# Patient Record
Sex: Male | Born: 1940 | Race: White | Hispanic: No | Marital: Married | Smoking: Former smoker
Health system: Southern US, Community
[De-identification: ages and names within clinical notes are randomized; demographics above are authoritative.]

## PROBLEM LIST (undated history)

## (undated) DIAGNOSIS — E7800 Pure hypercholesterolemia, unspecified: Secondary | ICD-10-CM

## (undated) DIAGNOSIS — K219 Gastro-esophageal reflux disease without esophagitis: Secondary | ICD-10-CM

## (undated) DIAGNOSIS — M159 Polyosteoarthritis, unspecified: Secondary | ICD-10-CM

## (undated) DIAGNOSIS — L309 Dermatitis, unspecified: Secondary | ICD-10-CM

## (undated) DIAGNOSIS — N401 Enlarged prostate with lower urinary tract symptoms: Secondary | ICD-10-CM

## (undated) DIAGNOSIS — M889 Osteitis deformans of unspecified bone: Secondary | ICD-10-CM

## (undated) DIAGNOSIS — G20A1 Parkinson's disease without dyskinesia, without mention of fluctuations: Secondary | ICD-10-CM

## (undated) DIAGNOSIS — I2510 Atherosclerotic heart disease of native coronary artery without angina pectoris: Secondary | ICD-10-CM

## (undated) DIAGNOSIS — G4733 Obstructive sleep apnea (adult) (pediatric): Secondary | ICD-10-CM

## (undated) DIAGNOSIS — M1990 Unspecified osteoarthritis, unspecified site: Secondary | ICD-10-CM

## (undated) DIAGNOSIS — G4730 Sleep apnea, unspecified: Secondary | ICD-10-CM

## (undated) DIAGNOSIS — I4891 Unspecified atrial fibrillation: Secondary | ICD-10-CM

## (undated) DIAGNOSIS — I255 Ischemic cardiomyopathy: Secondary | ICD-10-CM

## (undated) DIAGNOSIS — G20 Parkinson's disease: Secondary | ICD-10-CM

## (undated) DIAGNOSIS — G459 Transient cerebral ischemic attack, unspecified: Secondary | ICD-10-CM

## (undated) DIAGNOSIS — K4090 Unilateral inguinal hernia, without obstruction or gangrene, not specified as recurrent: Secondary | ICD-10-CM

## (undated) DIAGNOSIS — N183 Chronic kidney disease, stage 3 unspecified: Secondary | ICD-10-CM

## (undated) DIAGNOSIS — R490 Dysphonia: Secondary | ICD-10-CM

## (undated) DIAGNOSIS — E782 Mixed hyperlipidemia: Secondary | ICD-10-CM

## (undated) DIAGNOSIS — N138 Other obstructive and reflux uropathy: Secondary | ICD-10-CM

## (undated) DIAGNOSIS — Z789 Other specified health status: Secondary | ICD-10-CM

## (undated) HISTORY — DX: Sleep apnea, unspecified: G47.30

## (undated) HISTORY — DX: Unspecified osteoarthritis, unspecified site: M19.90

## (undated) HISTORY — DX: Mixed hyperlipidemia: E78.2

## (undated) HISTORY — DX: Other obstructive and reflux uropathy: N40.1

## (undated) HISTORY — DX: Polyosteoarthritis, unspecified: M15.9

## (undated) HISTORY — DX: Pure hypercholesterolemia, unspecified: E78.00

## (undated) HISTORY — DX: Other obstructive and reflux uropathy: N13.8

## (undated) HISTORY — PX: COLONOSCOPY: SHX174

## (undated) HISTORY — DX: Osteitis deformans of unspecified bone: M88.9

## (undated) HISTORY — DX: Obstructive sleep apnea (adult) (pediatric): G47.33

## (undated) HISTORY — DX: Other specified health status: Z78.9

## (undated) HISTORY — DX: Gastro-esophageal reflux disease without esophagitis: K21.9

## (undated) HISTORY — PX: OTHER SURGICAL HISTORY: SHX169

## (undated) HISTORY — DX: Dysphonia: R49.0

## (undated) HISTORY — DX: Chronic kidney disease, stage 3 unspecified: N18.30

## (undated) HISTORY — PX: TRANSTHORACIC ECHOCARDIOGRAM: SHX275

## (undated) HISTORY — DX: Dermatitis, unspecified: L30.9

---

## 1973-12-30 HISTORY — PX: VASECTOMY: SHX75

## 1998-06-23 ENCOUNTER — Ambulatory Visit: Admission: RE | Admit: 1998-06-23 | Discharge: 1998-06-23 | Payer: Self-pay | Admitting: Internal Medicine

## 2004-12-30 HISTORY — PX: KNEE SURGERY: SHX244

## 2005-12-30 HISTORY — PX: KNEE SURGERY: SHX244

## 2013-12-30 HISTORY — PX: COLONOSCOPY: SHX174

## 2017-09-24 ENCOUNTER — Encounter: Payer: Self-pay | Admitting: Neurology

## 2017-10-15 ENCOUNTER — Encounter: Payer: Self-pay | Admitting: Neurology

## 2017-10-20 NOTE — Progress Notes (Signed)
Mario George was seen today in the movement disorders clinic for neurologic consultation at the request of Merlene Laughter, MD.  The consultation is for the evaluation of possible Parkinson's disease.  I have reviewed records made available to me.  This patient is accompanied in the office by his spouse who supplements the history.  Specific Symptoms:  Tremor: Yes.   he has noted tremor for about 6 months in the left hand.  It is worse with vigorous exercise.  Wife notes it most with walking and holding steering wheel.  It is present at rest.  Wife noted it occasionally in the right hand.  He is right hand dominant. Family hx of similar:  No. Voice: He has noticed voice changes for about 2 years per patient.  States that he sang a lot as a younger person and noted about 4 years ago he was losing his voice and they were looking for vocal paralysis.  He had been living in [place] and was told that he might need surgery for a vocal cord issue.  He has been trying to hold off on surgery until he could afford to go to [place] to get it done but then [place] states.  His PCP told him he thought that this was a bigger issue. Sleep: sleeps well  Vivid Dreams:  No.  Acting out dreams:  No., snores and occasionally talk Wet Pillows: Yes.   Postural symptoms:  No.  Falls?  No. Bradykinesia symptoms: shuffling gait, drooling while awake and difficulty getting out of a chair Loss of smell:  Yes.   Loss of taste:  Yes.   - likes very spicy food Urinary Incontinence:  No. Difficulty Swallowing:  Yes.   but just apples.  Some coughing with meals Handwriting, micrographia: No. Trouble with ADL's:  Yes.   (able to do it all but slower)  Trouble buttoning clothing: No. Depression:  No. Memory changes:  No. Hallucinations:  No.  visual distortions: Yes.   N/V:  No. Lightheaded:  No.  Syncope: No. Diplopia:  No. Dyskinesia:  No.  Neuroimaging of the brain has previously been performed.   They were done in September, 2017 in [place].  No report is available in [place].  These were printed out films from [place] and not on a CD.  These were done without gadolinium.  They were unremarkable.  PREVIOUS MEDICATIONS: none to date  ALLERGIES:   Allergies  Allergen Reactions  . Other     Had rash on arm after pneumonia and flu vaccine together - never had either separately prior to this reaction  . Statins     Weakness/myalgia     CURRENT MEDICATIONS:  Outpatient Encounter Prescriptions as of 10/21/2017  Medication Sig  . cholecalciferol (VITAMIN D) 1000 units tablet Take 1,000 Units by mouth daily.  Marland Kitchen co-enzyme Q-10 30 MG capsule Take 100 mg by mouth 3 (three) times daily.  . LevOCARNitine (CARNITINE, L,) POWD by Does not apply route.  . Magnesium 400 MG CAPS Take by mouth.  . Misc Natural Products (FIBER 7 PO) Take by mouth.  . Probiotic Product (PROBIOTIC-10 PO) Take by mouth.  . carbidopa-levodopa (SINEMET IR) 25-100 MG tablet Take 1 tablet by mouth 3 (three) times daily.  . [DISCONTINUED] vitamin E 1000 UNIT capsule Take 1,000 Units by mouth daily.  . [DISCONTINUED] zoledronic acid (RECLAST) 5 MG/100ML SOLN injection Inject 5 mg into the vein once.   No facility-administered encounter medications  on file as of 10/21/2017.     PAST MEDICAL HISTORY:   Past Medical History:  Diagnosis Date  . Hypercholesteremia   . Paget's disease of bone   . Sleep apnea    quit cpap in 2017    PAST SURGICAL HISTORY:   Past Surgical History:  Procedure Laterality Date  . KNEE SURGERY Right 2006  . VASECTOMY  1975    SOCIAL HISTORY:   Social History   Social History  . Marital status: Married    Spouse name: N/A  . Number of children: N/A  . Years of education: N/A   Occupational History  . 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 Smoking status: Former Smoker    Quit date: 12/30/1982  . Smokeless tobacco: Never Used  . Alcohol use Yes      Comment: one drink (wine or beer) daily  . Drug use: No  . Sexual activity: Not on file   Other Topics Concern  . Not on file   Social History Narrative  . No narrative on file    FAMILY HISTORY:   Family Status  Relation Status  . Father Deceased  . Mother Deceased  . Sister Alive  . Brother Alive  . Child Deceased       trauma  . Child Alive       step daughters    ROS:  A complete 10 system review of systems was obtained and was unremarkable apart from what is mentioned above.  PHYSICAL EXAMINATION:    VITALS:   Vitals:   10/21/17 0854  BP: 118/70  Pulse: 66  SpO2: 96%  Weight: 155 lb (70.3 kg)  Height: 6\' 2"  (1.88 m)    GEN:  The patient appears stated age and is in NAD.  There is some PBA with laughter and crying HEENT:  Normocephalic, atraumatic.  The mucous membranes are moist. The superficial temporal arteries are without ropiness or tenderness. CV:  RRR Lungs:  CTAB Neck/HEME:  There are no carotid bruits bilaterally.  Neurological examination:  Orientation: The patient is alert and oriented x3. Fund of knowledge is appropriate.  Recent and remote memory are intact.  Attention and concentration are normal.    Able to name objects and repeat phrases. Cranial nerves: There is good facial symmetry. There is facial hypomimia.  Pupils are equal round and reactive to light bilaterally. Fundoscopic exam reveals clear margins bilaterally. Extraocular muscles are intact. The visual fields are full to confrontational testing. The speech is fluent and clear but he is very hypophonic and at times he is just at a whisper. Soft palate rises symmetrically and there is no tongue deviation. Hearing is intact to conversational tone. Sensation: Sensation is intact to light and pinprick throughout (facial, trunk, extremities). Vibration is intact at the bilateral big toe. There is no extinction with double simultaneous stimulation. There is no sensory dermatomal level  identified. Motor: Strength is 5/5 in the bilateral upper and lower extremities.   Shoulder shrug is equal and symmetric.  There is no pronator drift. Deep tendon reflexes: Deep tendon reflexes are 2-/4 at the bilateral biceps, triceps, brachioradialis, patella and achilles. Plantar responses are downgoing bilaterally.  Movement examination: Tone: There is mild increased tone in the LUE.  Tone elsewhere is normal.   Abnormal movements: There is a rare LUE tremor that doesn't change with distraction Coordination:  There is mild decremation with RAM's, with heel and toe taps  bilaterally Gait and Station: The patient has no difficulty arising out of a deep-seated chair without the use of the hands. The patient's stride length is normal.  He has a stooped posture.  He has a left upper extremity tremor with ambulation.  He has decreased arm swing bilaterally.  The patient has a negative pull test.      ASSESSMENT/PLAN:  1.  Parkinsonism.  I suspect that this does represent idiopathic Parkinson's disease.  The patient has tremor, bradykinesia, rigidity and mild postural instability.  -We discussed the diagnosis as well as pathophysiology of the disease.  We discussed treatment options as well as prognostic indicators.  Patient education was provided.  -We discussed that it used to be thought that levodopa would increase risk of melanoma but now it is believed that Parkinsons itself likely increases risk of melanoma. he is to get regular skin checks.  -Greater than 50% of the 60 minute visit was spent in counseling answering questions and talking about what to expect now as well as in the future.  We talked about medication options as well as potential future surgical options.  We talked about safety in the home.  -We decided to add carbidopa/levodopa 25/100.  1/2 tab tid x 1 wk, then 1/2 in am & noon & 1 at night for a week, then 1/2 in am &1 at noon &night for a week, then 1 po tid.  Coupon for [employer]  given.  Risks, benefits, side effects and alternative therapies were discussed.  The opportunity to ask questions was given and they were answered to the best of my ability.  The patient expressed understanding and willingness to follow the outlined treatment protocols.  -I wanted to refer the patient to the Parkinson's physical, occupational and speech therapy programs.  Unfortunately, the patient is self-pay at this time and cannot afford the services.    -We discussed community resources in the area including patient support groups and community exercise programs for PD and pt education was provided to the patient.  Some of the support services are free to the patient.  -He did meet with our Parkinson Child psychotherapist.  She is trying to work with the patient and help him with as many resources as possible.  -wife asked about driving eval but will need to hold until can afford.  Didn't see any reason today, either physically or cognitively.  -talked about neurocognitive testing in the future.  Again, he has no insurance and I do not think that this is a rush.  2.  Dysphasia  -needs MBE but at this time cannot afford that.  He may not be on Medicare until next July and we will does need to monitor this  3.  Follow up is anticipated in the next few months, sooner should new neurologic issues arise.  Cc:  Merlene Laughter, MD

## 2017-10-21 ENCOUNTER — Ambulatory Visit (INDEPENDENT_AMBULATORY_CARE_PROVIDER_SITE_OTHER): Payer: Self-pay | Admitting: Neurology

## 2017-10-21 ENCOUNTER — Encounter: Payer: Self-pay | Admitting: Neurology

## 2017-10-21 ENCOUNTER — Encounter: Payer: Self-pay | Admitting: Psychology

## 2017-10-21 VITALS — BP 118/70 | HR 66 | Ht 74.0 in | Wt 155.0 lb

## 2017-10-21 DIAGNOSIS — R1319 Other dysphagia: Secondary | ICD-10-CM

## 2017-10-21 DIAGNOSIS — G20 Parkinson's disease: Secondary | ICD-10-CM

## 2017-10-21 DIAGNOSIS — G20A1 Parkinson's disease without dyskinesia, without mention of fluctuations: Secondary | ICD-10-CM

## 2017-10-21 MED ORDER — CARBIDOPA-LEVODOPA 25-100 MG PO TABS
1.0000 | ORAL_TABLET | Freq: Three times a day (TID) | ORAL | 5 refills | Status: DC
Start: 2017-10-21 — End: 2018-02-03

## 2017-10-21 NOTE — Patient Instructions (Signed)
1. Start Carbidopa Levodopa as follows:  Take 1/2 tablet three times daily, at least 30 minutes before meals, for one week  Then take 1/2 tablet in the morning, 1/2 tablet in the afternoon, 1 tablet in the evening, at least 30 minutes before meals, for one week  Then take 1/2 tablet in the morning, 1 tablet in the afternoon, 1 tablet in the evening, at least 30 minutes before meals, for one week  Then take 1 tablet three times daily, at least 30 minutes before meals  

## 2017-10-21 NOTE — Progress Notes (Signed)
I met with the patient and his wife while they were in the clinic today. The patient is financially vulnerable as he is uninsured and will not be eligible for Medicare until July.   At this time, he does not qualify for Medicaid. However, if he could qualify if a certain amount of upaidmedical expenses are accrued/"deductible is met". Mr. Tsang thinks the deductible amount may be 11,000-13,000 in medical bills.   Since the patient and his wife are back in the [place] they need to rebuild credit. We talked about any bills that are unacculmulated, to make sure to arrange payment plans so they can pay some towards each bill while they are accumulating the amount for the Medicaid deductible.   I reviewed all Parkinson's resources in the area as they are new to moving back and are new to the diagnosis in general.   I have reached out to [facility]- are they willing to help with some with this special circumstance? I will also call the financial assistance department at [facility] to see if they know of any resources or guidance that they can provide while the patient is uninsured. In addition, I will connect the patient with the Insurance Commissioners office to see if there is any advocacy or other programs that the patient may benefit from.

## 2017-11-27 ENCOUNTER — Telehealth: Payer: Self-pay | Admitting: Neurology

## 2017-11-27 NOTE — Telephone Encounter (Signed)
Pt called and said Dr Tat put him on carbadopa and would like a call back to explain the regime of starting the medication

## 2017-11-27 NOTE — Telephone Encounter (Signed)
Called patient. He had not started Levodopa because he had been in [place]. He is back now and ready to start Levodopa. He lost instructions.  Went over this informatiion with patient: Start Carbidopa Levodopa as follows:  Take 1/2 tablet three times daily, at least 30 minutes before meals, for one week  Then take 1/2 tablet in the morning, 1/2 tablet in the afternoon, 1 tablet in the evening, at least 30 minutes before meals, for one week  Then take 1/2 tablet in the morning, 1 tablet in the afternoon, 1 tablet in the evening, at least 30 minutes before meals, for one week  Then take 1 tablet three times daily, at least 30 minutes before meals  He will call with any questions/problems.

## 2018-02-02 NOTE — Progress Notes (Signed)
Mario George was seen today in the movement disorders clinic for neurologic consultation at the request of Merlene Laughter, MD.  The consultation is for the evaluation of possible Parkinson's disease.  I have reviewed records made available to me.  This patient is accompanied in the office by his spouse who supplements the history.  Specific Symptoms:  Tremor: Yes.   he has noted tremor for about 6 months in the left hand.  It is worse with vigorous exercise.  Wife notes it most with walking and holding steering wheel.  It is present at rest.  Wife noted it occasionally in the right hand.  He is right hand dominant. Family hx of similar:  No. Voice: He has noticed voice changes for about 2 years per patient.  States that he sang a lot as a younger person and noted about 4 years ago he was losing his voice and they were looking for vocal paralysis.  He had been living in [place] and was told that he might need surgery for a vocal cord issue.  He has been trying to hold off on surgery until he could afford to go to [place] city to get it done but then [place].  His PCP told him he thought that this was a bigger issue. Sleep: sleeps well  Vivid Dreams:  No.  Acting out dreams:  No., snores and occasionally talk Wet Pillows: Yes.   Postural symptoms:  No.  Falls?  No. Bradykinesia symptoms: shuffling gait, drooling while awake and difficulty getting out of a chair Loss of smell:  Yes.   Loss of taste:  Yes.   - likes very spicy food Urinary Incontinence:  No. Difficulty Swallowing:  Yes.   but just apples.  Some coughing with meals Handwriting, micrographia: No. Trouble with ADL's:  Yes.   (able to do it all but slower)  Trouble buttoning clothing: No. Depression:  No. Memory changes:  No. Hallucinations:  No.  visual distortions: Yes.   N/V:  No. Lightheaded:  No.  Syncope: No. Diplopia:  No. Dyskinesia:  No.  Neuroimaging of the brain has previously been performed.   They were done in September, 2017 in [place].  No report is available in [place].  These were printed out films from [place] and not on a CD.  These were done without gadolinium.  They were unremarkable.  02/03/18 update: Patient seen today in follow-up.  He is accompanied by his wife who supplements history.  A new diagnosis of Parkinson's disease was made last visit.  He was given carbidopa/levodopa 25/100 to start last visit.  The patient has not started it.  States that he wishes to control the disease with nutrition and holistic methods.  Asks me for referral to physician who does integrative therapy and functional medicine.  Pt denies falls.  Pt denies lightheadedness, near syncope.  No hallucinations.  Mood has been good.  He is doing cycling 3 times per week at the [employer].   He is doing pickleball at the [employer].  He is having some drooling, but not particularly bothersome.  Is having a runny nose.  PREVIOUS MEDICATIONS: none to date  ALLERGIES:   Allergies  Allergen Reactions  . Other     Had rash on arm after pneumonia and flu vaccine together - never had either separately prior to this reaction  . Statins     Weakness/myalgia     CURRENT MEDICATIONS:  Outpatient Encounter Medications as  of 02/03/2018  Medication Sig  . b complex vitamins tablet Take 1 tablet by mouth daily.  . cholecalciferol (VITAMIN D) 1000 units tablet Take 1,000 Units by mouth daily.  Marland Kitchen co-enzyme Q-10 30 MG capsule Take 100 mg by mouth 3 (three) times daily.  . COCONUT OIL PO Take by mouth.  . LevOCARNitine (CARNITINE, L,) POWD by Does not apply route.  . Magnesium 400 MG CAPS Take by mouth.  . Misc Natural Products (FIBER 7 PO) Take by mouth.  . Omega-3 Fatty Acids (FISH OIL) 1000 MG CAPS Take by mouth.  . Probiotic Product (PROBIOTIC-10 PO) Take by mouth.  . vitamin B-12 (CYANOCOBALAMIN) 1000 MCG tablet Take 1,000 mcg by mouth daily.  . [DISCONTINUED] carbidopa-levodopa (SINEMET IR) 25-100 MG tablet Take 1 tablet  by mouth 3 (three) times daily.   No facility-administered encounter medications on file as of 02/03/2018.     PAST MEDICAL HISTORY:   Past Medical History:  Diagnosis Date  . Hypercholesteremia   . Paget's disease of bone   . Sleep apnea    quit cpap in 2017    PAST SURGICAL HISTORY:   Past Surgical History:  Procedure Laterality Date  . KNEE SURGERY Right 2006  . VASECTOMY  1975    SOCIAL HISTORY:   Social History   Socioeconomic History  . Marital status: Married    Spouse name: Not on file  . Number of children: Not on file  . Years of education: Not on file  . Highest education level: Not on file  Social Needs  . Financial resource strain: Not on file  . Food insecurity - worry: Not on file  . Food insecurity - inability: Not on file  . Transportation needs - medical: Not on file  . Transportation needs - non-medical: Not on file  Occupational History  . Occupation: retired    Comment: health care admin/hospital  Tobacco Use  . Smoking status: Former Smoker    Last attempt to quit: 12/30/1982    Years since quitting: 35.1  . Smokeless tobacco: Never Used  Substance and Sexual Activity  . Alcohol use: Yes    Comment: one drink (wine or beer) daily  . Drug use: No  . Sexual activity: Not on file  Other Topics Concern  . Not on file  Social History Narrative  . Not on file    FAMILY HISTORY:   Family Status  Relation Name Status  . Father  Deceased  . Mother  Deceased  . Sister x3 Alive  . Brother Civil engineer, contracting  . Child  Deceased       trauma  . Child 2 step Alive       step daughters    ROS:  A complete 10 system review of systems was obtained and was unremarkable apart from what is mentioned above.  PHYSICAL EXAMINATION:    VITALS:   Vitals:   02/03/18 1043  BP: 102/70  Pulse: 70  SpO2: 94%  Weight: 154 lb (69.9 kg)  Height: 6\' 2"  (1.88 m)    GEN:  The patient appears stated age and is in NAD.  There is some PBA with laughter and  crying HEENT:  Normocephalic, atraumatic.  The mucous membranes are moist. The superficial temporal arteries are without ropiness or tenderness. CV:  RRR Lungs:  CTAB Neck/HEME:  There are no carotid bruits bilaterally.  Neurological examination:  Orientation: The patient is alert and oriented x3.  Cranial nerves: There is good facial symmetry.  There is facial hypomimia. Extraocular muscles are intact. The visual fields are full to confrontational testing. The speech is fluent and clear but he is very hypophonic and at times he is just at a whisper. Soft palate rises symmetrically and there is no tongue deviation. Hearing is intact to conversational tone. Sensation: Sensation is intact to light touch throughout. Motor: Strength is 5/5 in the bilateral upper and lower extremities.   Shoulder shrug is equal and symmetric.  There is no pronator drift.  Movement examination: Tone: There is mild increased tone in the LUE.  Tone elsewhere is normal.   Abnormal movements: There is an intermittent left upper extremity resting tremor. Coordination:  There is mild decremation with hand opening and closing, finger taps, heel taps and toe taps on the left. Gait and Station: The patient has no difficulty arising out of a deep-seated chair without the use of the hands. The patient's stride length is normal.  He has a stooped posture.  He has a left upper extremity tremor with ambulation.  He has decreased arm swing bilaterally.  The patient has a positive pull test.      ASSESSMENT/PLAN:  1. Idiopathic Parkinson's disease.  The patient has tremor, bradykinesia, rigidity and mild postural instability.  -We discussed the diagnosis as well as pathophysiology of the disease.  We discussed treatment options as well as prognostic indicators.  Patient education was provided.  -We discussed that it used to be thought that levodopa would increase risk of melanoma but now it is believed that Parkinsons itself likely  increases risk of melanoma. he is to get regular skin checks.  He cannot do this until he has insurance, which will be in July.  -info given on integrative therapies at his request  -I had quite a long discussion with the patient and his wife today.  I gave him the scientific literature on starting levodopa early versus  waiting.  Current literature suggests starting medication early.  The patient had misperceptions and I cleared them up with data to the best of my ability.  Despite that, the patient really is only interested in alternative medicine.  I encouraged him to use integrative medicine as a complementary approach, rather than a sole approach to the treatment of Parkinson's disease.  I do not think he will do well if he uses it as the only approach.  He asks me for a referral to an integrative medicine physician.  I am unaware of one, although I did give him the name of integrative therapies, but to my knowledge they do not have a physician present.  -Invited patient to our Parkinson's symposium in July.  Also invited him to the free drumming class for Parkinson's patients.  -Would benefit from LSVT big and loud.  Cannot afford this right now and we will address next visit.  2.  Dysphasia  -needs MBE but at this time cannot afford that.  He may not be on Medicare until next July and we will does need to monitor this  3.  Vasomotor Rhinitis  -Discussed the association with Parkinson's disease.  There is really no particular treatment for this.  4.  Sialorrhea  -This is commonly associated with PD.  We talked about treatments.  The patient is not a candidate for oral anticholinergic therapy because of increased risk of confusion and falls.  We discussed Botox (type A and B) and 1% atropine drops.  We discusssed that candy like lemon drops can help by stimulating mm  of the oropharynx to induce swallowing.  He will try this method.  5.  I will see him back in 6 months, sooner should new  neurologic issues arise.  Much greater than 50% of this visit was spent in counseling and coordinating care.  Total face to face time:  30 min   Cc:  Merlene Laughter, MD

## 2018-02-03 ENCOUNTER — Ambulatory Visit (INDEPENDENT_AMBULATORY_CARE_PROVIDER_SITE_OTHER): Payer: Self-pay | Admitting: Neurology

## 2018-02-03 ENCOUNTER — Encounter: Payer: Self-pay | Admitting: Neurology

## 2018-02-03 VITALS — BP 102/70 | HR 70 | Ht 74.0 in | Wt 154.0 lb

## 2018-02-03 DIAGNOSIS — K117 Disturbances of salivary secretion: Secondary | ICD-10-CM

## 2018-02-03 DIAGNOSIS — G20 Parkinson's disease: Secondary | ICD-10-CM

## 2018-02-03 DIAGNOSIS — J300 Vasomotor rhinitis: Secondary | ICD-10-CM

## 2018-02-03 NOTE — Patient Instructions (Signed)
Integrative therapies:  [phone]. [address] [place], [place] [address]   You can look for trials at: [email or web address]

## 2018-07-01 ENCOUNTER — Other Ambulatory Visit: Payer: Self-pay | Admitting: General Surgery

## 2018-07-02 NOTE — H&P (Signed)
Mario George Location: [facility] Patient #: [id number] DOB: 21-Dec-1941 Married / Language: English / Race: White Male       History of Present Illness       This is a very pleasant man, 77 year old, patient of Dr. Pete Glatter. Returns with his wife to discuss right inguinal hernia repair. Dr. Arbutus Leas is his neurologist for his Parkinson's disease      I saw him on March 20, 2018 and he gave a great history for reducible right inguinal hernia, but neither I nor Dr. Pete Glatter was able to demonstrate. Now it is obvious and he wants to have this repaired. He is always able to reduce it. It is gotten fairly big and extending down toward the scrotum. He was to get this repaired this month. 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 Left-sided tremors under control. Hyperlipidemia. Sleep apnea but doesn't use his CPAP anymore Family history revealed GI cancer in mother and alcoholic cirrhosis and father Social history reveals that he is married. His wife is with him. They have one child died in a fire. 2 stepdaughters. Quit smoking 1985. Occasional alcohol. 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 They lived in [place] for a while but now moved to [place].     Exam reveals a large obvious reducible indirect right inguinal hernia. No hernia left We talked about open and laparoscopic repair We'll schedule for open repair of right inguinal hernia with mesh I discussed the indications, details, techniques, numerous risk of the surgery with him. He is aware the risk of bleeding, infection, recurrence, nerve damage, injury to adjacent organs with major reconstructive surgery. He understands all these issues. All his questions were answered. He agrees with this plan.  Orders entered Plan TAPP block Plan exparel   Past Surgical History Colon Polyp Removal - Colonoscopy  Knee Surgery   Right. Vasectomy   Diagnostic Studies History  Colonoscopy  5-10 years ago >10 years ago  Social History Alcohol use  Occasional alcohol use. Caffeine use  Coffee. No drug use  Tobacco use  Former smoker.  Family History  Alcohol Abuse  Father, Mother. Arthritis  Father. Cerebrovascular Accident  Brother. Colon Cancer  Mother. Family history unknown  First Degree Relatives  Melanoma  Brother.  Other Problems Hemorrhoids  Hypercholesterolemia  Inguinal Hernia  Other disease, cancer, significant illness  Sleep Apnea     Review of Systems General Not Present- Appetite Loss, Chills, Fatigue, Fever, Night Sweats, Weight Gain and Weight Loss. Skin Present- Dryness and Rash. Not Present- Change in Wart/Mole, Hives, Jaundice, New Lesions, Non-Healing Wounds and Ulcer. HEENT Present- Hoarseness. Not Present- Earache, Hearing Loss, Nose Bleed, Oral Ulcers, Ringing in the Ears, Seasonal Allergies, Sinus Pain, Sore Throat, Visual Disturbances, Wears glasses/contact lenses and Yellow Eyes. Respiratory Present- Snoring. Not Present- Bloody sputum, Chronic Cough, Difficulty Breathing and Wheezing. Breast Not Present- Breast Mass, Breast Pain, Nipple Discharge and Skin Changes. Cardiovascular Not Present- Chest Pain, Difficulty Breathing Lying Down, Leg Cramps, Palpitations, Rapid Heart Rate, Shortness of Breath and Swelling of Extremities. Gastrointestinal Present- Abdominal Pain, Constipation and Excessive gas. Not Present- Bloating, Bloody Stool, Change in Bowel Habits, Chronic diarrhea, Difficulty Swallowing, Gets full quickly at meals, Hemorrhoids, Indigestion, Nausea, Rectal Pain and Vomiting. Male Genitourinary Present- Frequency, Nocturia, Urgency and Urine Leakage. Not Present- Blood in Urine, Change in Urinary Stream, Impotence and Painful Urination. Musculoskeletal Present- Muscle Weakness. Not Present-  Back Pain, Joint Pain, Joint Stiffness, Muscle Pain and  Swelling of Extremities. Neurological Present- Trouble walking. Not Present- Decreased Memory, Fainting, Headaches, Numbness, Seizures, Tingling, Tremor and Weakness. Psychiatric Not Present- Anxiety, Bipolar, Change in Sleep Pattern, Depression, Fearful and Frequent crying. Endocrine Not Present- Cold Intolerance, Excessive Hunger, Hair Changes, Heat Intolerance, Hot flashes and New Diabetes. Hematology Present- Easy Bruising. Not Present- Blood Thinners, Excessive bleeding, Gland problems, HIV and Persistent Infections.  Vitals  Weight: 151.25 lb Height: 73in Body Surface Area: 1.91 m Body Mass Index: 19.95 kg/m  Temp.: 98.10F(Oral)  Pulse: 72 (Regular)  Resp.: 18 (Unlabored)  P.OX: 98% (Room air) BP: 110/68 (Sitting, Left Arm, Standard)     Physical Exam  General Mental Status-Alert. General Appearance-Not in acute distress. Build & Nutrition-Well nourished. Posture-Normal posture. Gait-Normal.  Head and Neck Head-normocephalic, atraumatic with no lesions or palpable masses. Trachea-midline. Thyroid Gland Characteristics - normal size and consistency and no palpable nodules.  Chest and Lung Exam Chest and lung exam reveals -on auscultation, normal breath sounds, no adventitious sounds and normal vocal resonance.  Cardiovascular Cardiovascular examination reveals -normal heart sounds, regular rate and rhythm with no murmurs and femoral artery auscultation bilaterally reveals normal pulses, no bruits, no thrills.  Abdomen Inspection Inspection of the abdomen reveals - No Hernias. Palpation/Percussion Palpation and Percussion of the abdomen reveal - Soft, Non Tender, No Rigidity (guarding), No hepatosplenomegaly and No Palpable abdominal masses.  Male Genitourinary Note: Large right inguinal hernia that extends past the external inguinal ring toward the scrotum. No hernia on the left. Right-sided hernia reducible. No testicular  mass.   Neurologic Neurologic evaluation reveals -alert and oriented x 3 with no impairment of recent or remote memory, normal attention span and ability to concentrate, normal sensation and normal coordination.  Musculoskeletal Normal Exam - Bilateral-Upper Extremity Strength Normal and Lower Extremity Strength Normal.    Assessment & Plan RIGHT INGUINAL HERNIA (K40.90)       You have an obvious right inguinal hernia It is fairly large We have discussed the different approaches and techniques Because of the large size of the hernia will be scheduled for open repair of right inguinal hernia with mesh I have discussed the indications, techniques, and risks of the surgery in detail with you and your wife     Because of your summer plans, I will have requested that we schedule this on July 16.  PARKINSONS DISEASE (G20) SLEEP APNEA IN ADULT (G47.30) HYPERLIPIDEMIA, MILD (E78.5)    Dorothey Oetken M. Derrell Lolling, M.D., [facility] General and Minimally invasive Surgery Breast and Colorectal Surgery Office:   [phone] Pager:   [phone]

## 2018-07-08 ENCOUNTER — Encounter (HOSPITAL_BASED_OUTPATIENT_CLINIC_OR_DEPARTMENT_OTHER): Payer: Self-pay | Admitting: *Deleted

## 2018-07-08 ENCOUNTER — Encounter (HOSPITAL_BASED_OUTPATIENT_CLINIC_OR_DEPARTMENT_OTHER)
Admission: RE | Admit: 2018-07-08 | Discharge: 2018-07-08 | Disposition: A | Discharge status: Home / Self Care | Payer: Medicare Other | Source: Ambulatory Visit | Attending: General Surgery | Admitting: General Surgery

## 2018-07-08 ENCOUNTER — Other Ambulatory Visit: Payer: Self-pay

## 2018-07-08 DIAGNOSIS — K4090 Unilateral inguinal hernia, without obstruction or gangrene, not specified as recurrent: Secondary | ICD-10-CM | POA: Diagnosis present

## 2018-07-08 DIAGNOSIS — Z800 Family history of malignant neoplasm of digestive organs: Secondary | ICD-10-CM | POA: Diagnosis not present

## 2018-07-08 DIAGNOSIS — G20 Parkinson's disease: Secondary | ICD-10-CM | POA: Diagnosis not present

## 2018-07-08 DIAGNOSIS — E7800 Pure hypercholesterolemia, unspecified: Secondary | ICD-10-CM | POA: Diagnosis not present

## 2018-07-08 DIAGNOSIS — Z01818 Encounter for other preprocedural examination: Secondary | ICD-10-CM | POA: Insufficient documentation

## 2018-07-08 DIAGNOSIS — M889 Osteitis deformans of unspecified bone: Secondary | ICD-10-CM | POA: Diagnosis not present

## 2018-07-08 DIAGNOSIS — Z823 Family history of stroke: Secondary | ICD-10-CM | POA: Diagnosis not present

## 2018-07-08 DIAGNOSIS — Z8261 Family history of arthritis: Secondary | ICD-10-CM | POA: Diagnosis not present

## 2018-07-08 DIAGNOSIS — Z86010 Personal history of colonic polyps: Secondary | ICD-10-CM | POA: Diagnosis not present

## 2018-07-08 DIAGNOSIS — Z808 Family history of malignant neoplasm of other organs or systems: Secondary | ICD-10-CM | POA: Diagnosis not present

## 2018-07-08 DIAGNOSIS — G4730 Sleep apnea, unspecified: Secondary | ICD-10-CM | POA: Diagnosis not present

## 2018-07-08 DIAGNOSIS — Z87891 Personal history of nicotine dependence: Secondary | ICD-10-CM | POA: Diagnosis not present

## 2018-07-08 DIAGNOSIS — Z811 Family history of alcohol abuse and dependence: Secondary | ICD-10-CM | POA: Diagnosis not present

## 2018-07-08 LAB — COMPREHENSIVE METABOLIC PANEL
ALT: 12 U/L (ref 0–44)
AST: 24 U/L (ref 15–41)
Albumin: 3.9 g/dL (ref 3.5–5.0)
Alkaline Phosphatase: 66 U/L (ref 38–126)
Anion gap: 5 (ref 5–15)
BUN: 22 mg/dL (ref 8–23)
CO2: 32 mmol/L (ref 22–32)
Calcium: 9.4 mg/dL (ref 8.9–10.3)
Chloride: 103 mmol/L (ref 98–111)
Creatinine, Ser: 0.93 mg/dL (ref 0.61–1.24)
Glucose, Bld: 83 mg/dL (ref 70–99)
POTASSIUM: 5 mmol/L (ref 3.5–5.1)
SODIUM: 140 mmol/L (ref 135–145)
Total Bilirubin: 0.7 mg/dL (ref 0.3–1.2)
Total Protein: 7.2 g/dL (ref 6.5–8.1)

## 2018-07-08 LAB — CBC WITH DIFFERENTIAL/PLATELET
Abs Immature Granulocytes: 0 10*3/uL (ref 0.0–0.1)
Basophils Absolute: 0 10*3/uL (ref 0.0–0.1)
Basophils Relative: 1 %
EOS ABS: 0.2 10*3/uL (ref 0.0–0.7)
EOS PCT: 4 %
HEMATOCRIT: 44 % (ref 39.0–52.0)
Hemoglobin: 14.5 g/dL (ref 13.0–17.0)
Immature Granulocytes: 0 %
LYMPHS ABS: 1.2 10*3/uL (ref 0.7–4.0)
Lymphocytes Relative: 26 %
MCH: 31.1 pg (ref 26.0–34.0)
MCHC: 33 g/dL (ref 30.0–36.0)
MCV: 94.4 fL (ref 78.0–100.0)
MONOS PCT: 15 %
Monocytes Absolute: 0.7 10*3/uL (ref 0.1–1.0)
Neutro Abs: 2.6 10*3/uL (ref 1.7–7.7)
Neutrophils Relative %: 54 %
Platelets: 175 10*3/uL (ref 150–400)
RBC: 4.66 MIL/uL (ref 4.22–5.81)
RDW: 12.7 % (ref 11.5–15.5)
WBC: 4.7 10*3/uL (ref 4.0–10.5)

## 2018-07-08 NOTE — Progress Notes (Signed)
Ensure presurgery drink given with instructions to complete by 0700 dos, surgical soap given with instructions, pt verbalized understanding. 

## 2018-07-13 ENCOUNTER — Telehealth: Payer: Self-pay | Admitting: Neurology

## 2018-07-13 NOTE — Telephone Encounter (Signed)
Patient had to reschedule appointment and wanted to make sure he could still have his Carbidopa Refilled until that next Appointment. Thanks

## 2018-07-13 NOTE — Telephone Encounter (Signed)
I didn't think that he was on the medication.  See last note.Marland KitchenMarland Kitchen

## 2018-07-14 ENCOUNTER — Ambulatory Visit (HOSPITAL_BASED_OUTPATIENT_CLINIC_OR_DEPARTMENT_OTHER)
Admission: RE | Admit: 2018-07-14 | Discharge: 2018-07-14 | Disposition: A | Discharge status: Home / Self Care | Payer: Medicare Other | Source: Ambulatory Visit | Attending: General Surgery | Admitting: General Surgery

## 2018-07-14 ENCOUNTER — Encounter (HOSPITAL_BASED_OUTPATIENT_CLINIC_OR_DEPARTMENT_OTHER)
Admission: RE | Disposition: A | Discharge status: Home / Self Care | Payer: Self-pay | Source: Ambulatory Visit | Attending: General Surgery

## 2018-07-14 ENCOUNTER — Ambulatory Visit (HOSPITAL_BASED_OUTPATIENT_CLINIC_OR_DEPARTMENT_OTHER): Payer: Medicare Other | Admitting: Anesthesiology

## 2018-07-14 ENCOUNTER — Encounter (HOSPITAL_BASED_OUTPATIENT_CLINIC_OR_DEPARTMENT_OTHER): Payer: Self-pay

## 2018-07-14 ENCOUNTER — Other Ambulatory Visit: Payer: Self-pay

## 2018-07-14 DIAGNOSIS — Z823 Family history of stroke: Secondary | ICD-10-CM | POA: Insufficient documentation

## 2018-07-14 DIAGNOSIS — Z87891 Personal history of nicotine dependence: Secondary | ICD-10-CM | POA: Insufficient documentation

## 2018-07-14 DIAGNOSIS — Z86010 Personal history of colonic polyps: Secondary | ICD-10-CM | POA: Insufficient documentation

## 2018-07-14 DIAGNOSIS — G20 Parkinson's disease: Secondary | ICD-10-CM | POA: Diagnosis not present

## 2018-07-14 DIAGNOSIS — Z808 Family history of malignant neoplasm of other organs or systems: Secondary | ICD-10-CM | POA: Insufficient documentation

## 2018-07-14 DIAGNOSIS — Z8261 Family history of arthritis: Secondary | ICD-10-CM | POA: Insufficient documentation

## 2018-07-14 DIAGNOSIS — K4090 Unilateral inguinal hernia, without obstruction or gangrene, not specified as recurrent: Secondary | ICD-10-CM

## 2018-07-14 DIAGNOSIS — E7800 Pure hypercholesterolemia, unspecified: Secondary | ICD-10-CM | POA: Insufficient documentation

## 2018-07-14 DIAGNOSIS — M889 Osteitis deformans of unspecified bone: Secondary | ICD-10-CM | POA: Insufficient documentation

## 2018-07-14 DIAGNOSIS — Z800 Family history of malignant neoplasm of digestive organs: Secondary | ICD-10-CM | POA: Insufficient documentation

## 2018-07-14 DIAGNOSIS — G4730 Sleep apnea, unspecified: Secondary | ICD-10-CM | POA: Insufficient documentation

## 2018-07-14 DIAGNOSIS — Z811 Family history of alcohol abuse and dependence: Secondary | ICD-10-CM | POA: Insufficient documentation

## 2018-07-14 HISTORY — DX: Parkinson's disease: G20

## 2018-07-14 HISTORY — PX: INGUINAL HERNIA REPAIR: SHX194

## 2018-07-14 HISTORY — PX: INSERTION OF MESH: SHX5868

## 2018-07-14 HISTORY — DX: Unilateral inguinal hernia, without obstruction or gangrene, not specified as recurrent: K40.90

## 2018-07-14 HISTORY — DX: Parkinson's disease without dyskinesia, without mention of fluctuations: G20.A1

## 2018-07-14 SURGERY — REPAIR, HERNIA, INGUINAL, ADULT
Anesthesia: General | Laterality: Right

## 2018-07-14 MED ORDER — DEXAMETHASONE SODIUM PHOSPHATE 10 MG/ML IJ SOLN
INTRAMUSCULAR | Status: AC
  Filled 2018-07-14: qty 1

## 2018-07-14 MED ORDER — CHLORHEXIDINE GLUCONATE CLOTH 2 % EX PADS: 6.0000 | MEDICATED_PAD | Freq: Once | CUTANEOUS | Status: DC

## 2018-07-14 MED ORDER — CELECOXIB 200 MG PO CAPS
200.0000 mg | ORAL_CAPSULE | ORAL | Status: AC
  Administered 2018-07-14: 200 mg via ORAL

## 2018-07-14 MED ORDER — ACETAMINOPHEN 325 MG PO TABS: 650.0000 mg | ORAL_TABLET | ORAL | Status: DC | PRN

## 2018-07-14 MED ORDER — SODIUM CHLORIDE 0.9% FLUSH: 3.0000 mL | Freq: Two times a day (BID) | INTRAVENOUS | Status: DC

## 2018-07-14 MED ORDER — SUGAMMADEX SODIUM 200 MG/2ML IV SOLN
INTRAVENOUS | Status: DC | PRN
  Administered 2018-07-14: 200 mg via INTRAVENOUS

## 2018-07-14 MED ORDER — LACTATED RINGERS IV SOLN: INTRAVENOUS | Status: DC

## 2018-07-14 MED ORDER — FENTANYL CITRATE (PF) 100 MCG/2ML IJ SOLN
50.0000 ug | INTRAMUSCULAR | Status: DC | PRN
  Administered 2018-07-14: 50 ug via INTRAVENOUS

## 2018-07-14 MED ORDER — ACETAMINOPHEN 500 MG PO TABS
1000.0000 mg | ORAL_TABLET | ORAL | Status: AC
  Administered 2018-07-14: 1000 mg via ORAL

## 2018-07-14 MED ORDER — GABAPENTIN 300 MG PO CAPS
ORAL_CAPSULE | ORAL | Status: AC
  Filled 2018-07-14: qty 1

## 2018-07-14 MED ORDER — FENTANYL CITRATE (PF) 100 MCG/2ML IJ SOLN
INTRAMUSCULAR | Status: AC
  Filled 2018-07-14: qty 2

## 2018-07-14 MED ORDER — SCOPOLAMINE 1 MG/3DAYS TD PT72: 1.0000 | MEDICATED_PATCH | Freq: Once | TRANSDERMAL | Status: DC | PRN

## 2018-07-14 MED ORDER — SODIUM CHLORIDE 0.9 % IJ SOLN
INTRAMUSCULAR | Status: AC
  Filled 2018-07-14: qty 20

## 2018-07-14 MED ORDER — CELECOXIB 200 MG PO CAPS
ORAL_CAPSULE | ORAL | Status: AC
  Filled 2018-07-14: qty 1

## 2018-07-14 MED ORDER — DEXAMETHASONE SODIUM PHOSPHATE 4 MG/ML IJ SOLN
INTRAMUSCULAR | Status: DC | PRN
  Administered 2018-07-14: 10 mg via INTRAVENOUS

## 2018-07-14 MED ORDER — ROCURONIUM BROMIDE 100 MG/10ML IV SOLN
INTRAVENOUS | Status: DC | PRN
  Administered 2018-07-14: 40 mg via INTRAVENOUS

## 2018-07-14 MED ORDER — BUPIVACAINE-EPINEPHRINE (PF) 0.5% -1:200000 IJ SOLN
INTRAMUSCULAR | Status: DC | PRN
  Administered 2018-07-14: 25 mL via PERINEURAL

## 2018-07-14 MED ORDER — EPHEDRINE SULFATE 50 MG/ML IJ SOLN
INTRAMUSCULAR | Status: DC | PRN
  Administered 2018-07-14 (×2): 10 mg via INTRAVENOUS

## 2018-07-14 MED ORDER — ROCURONIUM BROMIDE 10 MG/ML (PF) SYRINGE
PREFILLED_SYRINGE | INTRAVENOUS | Status: AC
  Filled 2018-07-14: qty 10

## 2018-07-14 MED ORDER — MIDAZOLAM HCL 2 MG/2ML IJ SOLN
INTRAMUSCULAR | Status: AC
  Filled 2018-07-14: qty 2

## 2018-07-14 MED ORDER — SODIUM CHLORIDE 0.9% FLUSH: 3.0000 mL | INTRAVENOUS | Status: DC | PRN

## 2018-07-14 MED ORDER — ACETAMINOPHEN 500 MG PO TABS
ORAL_TABLET | ORAL | Status: AC
  Filled 2018-07-14: qty 2

## 2018-07-14 MED ORDER — BUPIVACAINE LIPOSOME 1.3 % IJ SUSP: 20.0000 mL | Freq: Once | INTRAMUSCULAR | Status: DC

## 2018-07-14 MED ORDER — PROPOFOL 10 MG/ML IV BOLUS
INTRAVENOUS | Status: DC | PRN
  Administered 2018-07-14: 100 mg via INTRAVENOUS

## 2018-07-14 MED ORDER — CEFAZOLIN SODIUM-DEXTROSE 2-4 GM/100ML-% IV SOLN
2.0000 g | INTRAVENOUS | Status: AC
  Administered 2018-07-14: 2 g via INTRAVENOUS

## 2018-07-14 MED ORDER — EPHEDRINE SULFATE 50 MG/ML IJ SOLN
INTRAMUSCULAR | Status: AC
  Filled 2018-07-14: qty 1

## 2018-07-14 MED ORDER — LIDOCAINE HCL (CARDIAC) PF 100 MG/5ML IV SOSY
PREFILLED_SYRINGE | INTRAVENOUS | Status: AC
  Filled 2018-07-14: qty 5

## 2018-07-14 MED ORDER — ONDANSETRON HCL 4 MG/2ML IJ SOLN
INTRAMUSCULAR | Status: DC | PRN
  Administered 2018-07-14: 4 mg via INTRAVENOUS

## 2018-07-14 MED ORDER — OXYCODONE HCL 5 MG PO TABS: 5.0000 mg | ORAL_TABLET | ORAL | Status: DC | PRN

## 2018-07-14 MED ORDER — MIDAZOLAM HCL 2 MG/2ML IJ SOLN: 1.0000 mg | INTRAMUSCULAR | Status: DC | PRN

## 2018-07-14 MED ORDER — LACTATED RINGERS IV SOLN
INTRAVENOUS | Status: DC
  Administered 2018-07-14 (×3): via INTRAVENOUS

## 2018-07-14 MED ORDER — BUPIVACAINE LIPOSOME 1.3 % IJ SUSP
INTRAMUSCULAR | Status: AC
Start: 2018-07-14 — End: ?
  Filled 2018-07-14: qty 20

## 2018-07-14 MED ORDER — FENTANYL CITRATE (PF) 100 MCG/2ML IJ SOLN: 25.0000 ug | INTRAMUSCULAR | Status: DC | PRN

## 2018-07-14 MED ORDER — ONDANSETRON HCL 4 MG/2ML IJ SOLN: 4.0000 mg | Freq: Once | INTRAMUSCULAR | Status: DC | PRN

## 2018-07-14 MED ORDER — CEFAZOLIN SODIUM-DEXTROSE 2-4 GM/100ML-% IV SOLN
INTRAVENOUS | Status: AC
  Filled 2018-07-14: qty 100

## 2018-07-14 MED ORDER — GABAPENTIN 300 MG PO CAPS
300.0000 mg | ORAL_CAPSULE | ORAL | Status: AC
  Administered 2018-07-14: 300 mg via ORAL

## 2018-07-14 MED ORDER — HYDROCODONE-ACETAMINOPHEN 5-325 MG PO TABS: 1.0000 | ORAL_TABLET | Freq: Four times a day (QID) | ORAL | 0 refills | Status: DC | PRN

## 2018-07-14 MED ORDER — ACETAMINOPHEN 650 MG RE SUPP
650.0000 mg | RECTAL | Status: DC | PRN
Start: 2018-07-14 — End: 2018-07-14

## 2018-07-14 MED ORDER — SODIUM CHLORIDE 0.9 % IV SOLN
INTRAVENOUS | Status: DC | PRN
  Administered 2018-07-14: 25 mL

## 2018-07-14 MED ORDER — ONDANSETRON HCL 4 MG/2ML IJ SOLN
INTRAMUSCULAR | Status: AC
  Filled 2018-07-14: qty 2

## 2018-07-14 MED ORDER — SODIUM CHLORIDE 0.9 % IV SOLN: 250.0000 mL | INTRAVENOUS | Status: DC | PRN

## 2018-07-14 MED ORDER — SUGAMMADEX SODIUM 200 MG/2ML IV SOLN
INTRAVENOUS | Status: AC
  Filled 2018-07-14: qty 2

## 2018-07-14 SURGICAL SUPPLY — 67 items
ADH SKN CLS APL DERMABOND .7 (GAUZE/BANDAGES/DRESSINGS) ×1
APL SKNCLS STERI-STRIP NONHPOA (GAUZE/BANDAGES/DRESSINGS)
BENZOIN TINCTURE PRP APPL 2/3 (GAUZE/BANDAGES/DRESSINGS) IMPLANT
BLADE CLIPPER SURG (BLADE) ×2 IMPLANT
BLADE HEX COATED 2.75 (ELECTRODE) ×3 IMPLANT
BLADE SURG 10 STRL SS (BLADE) ×3 IMPLANT
CANISTER SUCT 1200ML W/VALVE (MISCELLANEOUS) ×3 IMPLANT
CHLORAPREP W/TINT 26ML (MISCELLANEOUS) ×3 IMPLANT
CLOSURE WOUND 1/2 X4 (GAUZE/BANDAGES/DRESSINGS)
COVER BACK TABLE 60X90IN (DRAPES) ×6 IMPLANT
COVER MAYO STAND STRL (DRAPES) ×3 IMPLANT
DECANTER SPIKE VIAL GLASS SM (MISCELLANEOUS) IMPLANT
DERMABOND ADVANCED (GAUZE/BANDAGES/DRESSINGS) ×2
DERMABOND ADVANCED .7 DNX12 (GAUZE/BANDAGES/DRESSINGS) IMPLANT
DRAIN PENROSE 1/2X12 LTX STRL (WOUND CARE) ×3 IMPLANT
DRAPE LAPAROTOMY 100X72 PEDS (DRAPES) ×3 IMPLANT
DRAPE LAPAROTOMY TRNSV 102X78 (DRAPE) ×2 IMPLANT
DRAPE UTILITY XL STRL (DRAPES) ×3 IMPLANT
ELECT REM PT RETURN 9FT ADLT (ELECTROSURGICAL) ×3
ELECTRODE REM PT RTRN 9FT ADLT (ELECTROSURGICAL) ×1 IMPLANT
GAUZE SPONGE 4X4 12PLY STRL LF (GAUZE/BANDAGES/DRESSINGS) IMPLANT
GLOVE BIOGEL PI IND STRL 6.5 (GLOVE) IMPLANT
GLOVE BIOGEL PI IND STRL 7.0 (GLOVE) IMPLANT
GLOVE BIOGEL PI INDICATOR 6.5 (GLOVE) ×4
GLOVE BIOGEL PI INDICATOR 7.0 (GLOVE) ×2
GLOVE ECLIPSE 6.5 STRL STRAW (GLOVE) ×2 IMPLANT
GLOVE EUDERMIC 7 POWDERFREE (GLOVE) ×3 IMPLANT
GOWN STRL REUS W/ TWL LRG LVL3 (GOWN DISPOSABLE) ×1 IMPLANT
GOWN STRL REUS W/ TWL XL LVL3 (GOWN DISPOSABLE) ×1 IMPLANT
GOWN STRL REUS W/TWL LRG LVL3 (GOWN DISPOSABLE) ×3
GOWN STRL REUS W/TWL XL LVL3 (GOWN DISPOSABLE) ×3
MESH ULTRAPRO 3X6 7.6X15CM (Mesh General) ×2 IMPLANT
NDL HYPO 25X1 1.5 SAFETY (NEEDLE) ×1 IMPLANT
NEEDLE HYPO 22GX1.5 SAFETY (NEEDLE) ×3 IMPLANT
NEEDLE HYPO 25X1 1.5 SAFETY (NEEDLE) ×3 IMPLANT
NS IRRIG 1000ML POUR BTL (IV SOLUTION) ×3 IMPLANT
PACK BASIN DAY SURGERY FS (CUSTOM PROCEDURE TRAY) ×3 IMPLANT
PENCIL BUTTON HOLSTER BLD 10FT (ELECTRODE) ×3 IMPLANT
SLEEVE SCD COMPRESS KNEE MED (MISCELLANEOUS) ×2 IMPLANT
SPONGE LAP 4X18 RFD (DISPOSABLE) IMPLANT
STAPLER VISISTAT 35W (STAPLE) IMPLANT
STRIP CLOSURE SKIN 1/2X4 (GAUZE/BANDAGES/DRESSINGS) IMPLANT
SUT MNCRL AB 4-0 PS2 18 (SUTURE) ×3 IMPLANT
SUT PROLENE 1 CT (SUTURE) IMPLANT
SUT PROLENE 2 0 CT2 30 (SUTURE) ×6 IMPLANT
SUT SILK 2 0 SH (SUTURE) IMPLANT
SUT SILK 2 0 TIES 17X18 (SUTURE) ×3
SUT SILK 2-0 18XBRD TIE BLK (SUTURE) ×1 IMPLANT
SUT SILK 3 0 SH 30 (SUTURE) IMPLANT
SUT VIC AB 2-0 CT1 27 (SUTURE)
SUT VIC AB 2-0 CT1 TAPERPNT 27 (SUTURE) IMPLANT
SUT VIC AB 2-0 SH 27 (SUTURE) ×6
SUT VIC AB 2-0 SH 27XBRD (SUTURE) ×1 IMPLANT
SUT VIC AB 3-0 54X BRD REEL (SUTURE) IMPLANT
SUT VIC AB 3-0 BRD 54 (SUTURE) ×3
SUT VIC AB 3-0 FS2 27 (SUTURE) IMPLANT
SUT VIC AB 3-0 SH 27 (SUTURE) ×3
SUT VIC AB 3-0 SH 27X BRD (SUTURE) ×1 IMPLANT
SUT VICRYL 3-0 CR8 SH (SUTURE) IMPLANT
SYR 10ML LL (SYRINGE) ×3 IMPLANT
SYR 20CC LL (SYRINGE) ×3 IMPLANT
SYR BULB 3OZ (MISCELLANEOUS) ×3 IMPLANT
TOWEL OR NON WOVEN STRL DISP B (DISPOSABLE) ×3 IMPLANT
TRAY DSU PREP LF (CUSTOM PROCEDURE TRAY) ×3 IMPLANT
TUBE CONNECTING 20'X1/4 (TUBING) ×1
TUBE CONNECTING 20X1/4 (TUBING) ×2 IMPLANT
YANKAUER SUCT BULB TIP NO VENT (SUCTIONS) ×3 IMPLANT

## 2018-07-14 NOTE — Discharge Instructions (Signed)
[facility] _______[facility] ° °UMBILICAL OR INGUINAL HERNIA REPAIR: POST OP INSTRUCTIONS ° °Always review your discharge instruction sheet given to you by the facility where your surgery was performed. °IF YOU HAVE DISABILITY OR FAMILY LEAVE FORMS, YOU MUST BRING THEM TO THE OFFICE FOR PROCESSING.   °DO NOT GIVE THEM TO YOUR DOCTOR. ° °1. A  prescription for pain medication may be given to you upon discharge.  Take your pain medication as prescribed, if needed.  If narcotic pain medicine is not needed, then you may take acetaminophen (Tylenol) or ibuprofen (Advil) as needed. °2. Take your usually prescribed medications unless otherwise directed. °If you need a refill on your pain medication, please contact your pharmacy.  They will contact our office to request authorization. Prescriptions will not be filled after 5 pm or on week-ends. °3. You should follow a light diet the first 24 hours after arrival home, such as soup and crackers, etc.  Be sure to include lots of fluids daily.  Resume your normal diet the day after surgery. °4.Most patients will experience some swelling and bruising around the umbilicus or in the groin and scrotum.  Ice packs and reclining will help.  Swelling and bruising can take several days to resolve.  °6. It is common to experience some constipation if taking pain medication after surgery.  Increasing fluid intake and taking a stool softener (such as Colace) will usually help or prevent this problem from occurring.  A mild laxative (Milk of Magnesia or Miralax) should be taken according to package directions if there are no bowel movements after 48 hours. °7. Unless discharge instructions indicate otherwise, you may remove your bandages 24-48 hours after surgery, and you may shower at that time.  You may have steri-strips (small skin tapes) in place directly over the incision.  These strips should be left on the skin for 7-10 days.  If your surgeon used skin glue on the  incision, you may shower in 24 hours.  The glue will flake off over the next 2-3 weeks.  Any sutures or staples will be removed at the office during your follow-up visit. °8. ACTIVITIES:  You may resume regular (light) daily activities beginning the next day--such as daily self-care, walking, climbing stairs--gradually increasing activities as tolerated.  You may have sexual intercourse when it is comfortable.  Refrain from any heavy lifting or straining until approved by your doctor. ° °a.You may drive when you are no longer taking prescription pain medication, you can comfortably wear a seatbelt, and you can safely maneuver your car and apply brakes. °b.RETURN TO WORK:   °_____________________________________________ ° °9.You should see your doctor in the office for a follow-up appointment approximately 2-3 weeks after your surgery.  Make sure that you call for this appointment within a day or two after you arrive home to insure a convenient appointment time. °10.OTHER INSTRUCTIONS: _________________________ °   _____________________________________ ° °WHEN TO CALL YOUR DOCTOR: °1. Fever over 101.0 °2. Inability to urinate °3. Nausea and/or vomiting °4. Extreme swelling or bruising °5. Continued bleeding from incision. °6. Increased pain, redness, or drainage from the incision ° °The clinic staff is available to answer your questions during regular business hours.  Please don’t hesitate to call and ask to speak to one of the nurses for clinical concerns.  If you have a medical emergency, go to the nearest emergency room or call 911.  A surgeon from [facility] is always on call at the hospital ° ° °  [address], [address], [place], [place]  [address] ? ° [address] [address], [place], [place]   [address] °[phone] ? [phone] ? FAX [phone] °Web site: [email or web address] ° ° °Post Anesthesia Home Care Instructions ° °Activity: °Get plenty of rest for the remainder of the day. A  responsible individual must stay with you for 24 hours following the procedure.  °For the next 24 hours, DO NOT: °-Drive a car °-Operate machinery °-Drink alcoholic beverages °-Take any medication unless instructed by your physician °-Make any legal decisions or sign important papers. ° °Meals: °Start with liquid foods such as gelatin or soup. Progress to regular foods as tolerated. Avoid greasy, spicy, heavy foods. If nausea and/or vomiting occur, drink only clear liquids until the nausea and/or vomiting subsides. Call your physician if vomiting continues. ° °Special Instructions/Symptoms: °Your throat may feel dry or sore from the anesthesia or the breathing tube placed in your throat during surgery. If this causes discomfort, gargle with warm salt water. The discomfort should disappear within 24 hours. ° °If you had a scopolamine patch placed behind your ear for the management of post- operative nausea and/or vomiting: ° °1. The medication in the patch is effective for 72 hours, after which it should be removed.  Wrap patch in a tissue and discard in the trash. Wash hands thoroughly with soap and water. °2. You may remove the patch earlier than 72 hours if you experience unpleasant side effects which may include dry mouth, dizziness or visual disturbances. °3. Avoid touching the patch. Wash your hands with soap and water after contact with the patch. °  ° °

## 2018-07-14 NOTE — Op Note (Signed)
Patient Name:           Mario George   Date of Surgery:        07/14/2018  Pre op Diagnosis:      Right inguinal hernia   Post oo diagnosis: Indirect right inguinal hernia     Procedure:                 Open repair right inguinal hernia with mesh [facility] repair)  Surgeon:                     Angelia Mould. Derrell Lolling, M.D., FACS  Assistant:                      OR staff  Operative Indications:   This is a very pleasant 77 year old, patient of Dr. Pete Glatter. Dr. Arbutus Leas is his neurologist for his Parkinson's disease.  He is brought to the operating room for elective repair of a right inguinal hernia.      I saw him on March 20, 2018 and he gave a great history for reducible right inguinal hernia, but neither I nor Dr. Pete Glatter was able to demonstrate. Now it is obvious and he wants to have this repaired. He is always able to reduce it. It is gotten fairly big and extending down toward the scrotum. He wants to get this repaired this month. 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 Left-sided tremors under control. Hyperlipidemia. Sleep apnea but doesn't use his CPAP anymore     Exam reveals a large obvious reducible indirect right inguinal hernia. No hernia on the left We talked about open and laparoscopic repair We'll schedule for open repair of right inguinal hernia with mesh I discussed the indications, details, techniques, numerous risk of the surgery with him. He agrees with this plan.    Operative Findings:       The patient had an indirect right inguinal hernia.  The indirect sac was fairly extensive but did not go into the scrotum.  It was chronically scarred in place and looked like it had been there for a while.  When I opened the sac it was completely empty.  There were no incarcerated contents.  The floor of the inguinal canal was weak but it was not bulging.  The patient underwent a TAPP block by the anesthesiologist  and I also used a 50% solution of Exparel as a local infiltration anesthetic.  Procedure in Detail:          Following the induction of general endotracheal anesthesia the patient's abdomen and genitalia were prepped and draped in a sterile fashion.  Surgical timeout was performed.  Intravenous antibiotics were given.  A 50% solution of Exparel was used to infiltrate all of the tissue layers.  Approximately 18 cc was used. A transverse incision was made in the right inguinal area.  Dissection was carried down through the subcutaneous tissue exposing the aponeurosis of the external oblique.  The external oblique was incised in the direction of its fibers, opening up the external inguinal ring.  The external oblique was dissected away from the underlying tissues and self-retaining retractors were placed.  The cord structures were mobilized and encircled with a Penrose drain.  Cremasteric muscle fibers were skeletonized.  The indirect sac was slowly dissected away from the cord structures.  The indirect sac was opened and explored and found to be empty.  The sac was twisted and suture ligated at the level of the internal ring with a suture ligature of 2-0 Vicryl.  The redundant sac was excised and discarded.  The ileo-inguinal nerve was traced back to its emergence from the muscles laterally, clamped divided and ligated with a 2-0 silk tie.  The redundant nerve was resected medially.      The floor of the inguinal canal was repaired and reinforced with a 3 inch x 6 inch piece of ultra pro mesh.  The mesh was brought to the operating field and trimmed at the corners to accommodate the anatomy of the wound.  The mesh was sutured in place with running sutures of 2-0 Prolene and interrupted mattress sutures of 2-0 Prolene.  The mesh was sutured so as to generously overlap the fascia at the pubic tubercle, then along the inguinal ligament inferiorly.  Medially, superiorly, and superior laterally several mattress sutures  of Prolene were placed to secure the mesh.  The mesh was incised laterally so as to wrap around the cord structures at the internal ring.  The tails of the mesh were overlapped laterally and further Prolene sutures were placed laterally.  This provided very secure coverage and repair with medial and lateral to the internal ring but allowed an adequate fingertip opening for the cord structures.  The wound was irrigated.  Hemostasis was excellent.  The external oblique was closed with a running suture of 2-0 Vicryl placed in the cord structures deep to the external oblique.  Scarpa's fascia was closed with 3-0 Vicryl suture and skin closed with a running sub-cuticular 4-0 Monocryl and Dermabond.  The patient tolerated the procedure well was taken to PACU in stable condition.  EBL 15 cc.  Counts correct.  Complications none.    Addendum: I logged onto the [employer] and reviewed his prescription medication history.     Angelia Mould. Derrell Lolling, M.D., FACS General and Minimally Invasive Surgery Breast and Colorectal Surgery  07/14/2018 12:10 PM

## 2018-07-14 NOTE — Anesthesia Procedure Notes (Signed)
Procedure Name: Intubation Date/Time: 07/14/2018 10:59 AM Performed by: Lyndee Leo, CRNA Pre-anesthesia Checklist: Patient identified, Emergency Drugs available, Suction available and Patient being monitored Patient Re-evaluated:Patient Re-evaluated prior to induction Oxygen Delivery Method: Circle system utilized Preoxygenation: Pre-oxygenation with 100% oxygen Induction Type: IV induction Ventilation: Mask ventilation without difficulty Laryngoscope Size: Mac and 4 Grade View: Grade II Tube type: Oral Tube size: 8.0 mm Number of attempts: 1 Airway Equipment and Method: Stylet and Oral airway Placement Confirmation: ETT inserted through vocal cords under direct vision,  positive ETCO2 and breath sounds checked- equal and bilateral Secured at: 22 cm Tube secured with: Tape Dental Injury: Teeth and Oropharynx as per pre-operative assessment

## 2018-07-14 NOTE — Telephone Encounter (Signed)
Left message on machine for patient to call back.

## 2018-07-14 NOTE — Progress Notes (Signed)
AssistedDr. Turk with right, ultrasound guided, transabdominal plane block. Side rails up, monitors on throughout procedure. See vital signs in flow sheet. Tolerated Procedure well.  

## 2018-07-14 NOTE — Interval H&P Note (Signed)
History and Physical Interval Note:  07/14/2018 9:03 AM  Mario George  has presented today for surgery, with the diagnosis of Right inguinal hernia  The various methods of treatment have been discussed with the patient and family. After consideration of risks, benefits and other options for treatment, the patient has consented to  Procedure(s): OPEN RIGHT INGUINAL HERNIA REPAIR ERAS PATHWAY (Right) INSERTION OF MESH (Right) as a surgical intervention .  The patient's history has been reviewed, patient examined, no change in status, stable for surgery.  I have reviewed the patient's chart and labs.  Questions were answered to the patient's satisfaction.     Ernestene Mention

## 2018-07-14 NOTE — Transfer of Care (Signed)
Immediate Anesthesia Transfer of Care Note  Patient: Mario George  Procedure(s) Performed: OPEN RIGHT INGUINAL HERNIA REPAIR ERAS PATHWAY (Right ) INSERTION OF MESH (Right )  Patient Location: PACU  Anesthesia Type:GA combined with regional for post-op pain  Level of Consciousness: sedated and responds to stimulation  Airway & Oxygen Therapy: Patient Spontanous Breathing and Patient connected to face mask oxygen  Post-op Assessment: Report given to RN and Post -op Vital signs reviewed and stable  Post vital signs: Reviewed and stable  Last Vitals:  Vitals Value Taken Time  BP 129/68 07/14/2018 12:16 PM  Temp    Pulse 70 07/14/2018 12:18 PM  Resp 13 07/14/2018 12:18 PM  SpO2 100 % 07/14/2018 12:18 PM  Vitals shown include unvalidated device data.  Last Pain:  Vitals:   07/14/18 1004  TempSrc:   PainSc: 0-No pain      Patients Stated Pain Goal: 3 (07/14/18 1004)  Complications: No apparent anesthesia complications

## 2018-07-14 NOTE — Anesthesia Preprocedure Evaluation (Signed)
Anesthesia Evaluation  Patient identified by MRN, date of birth, ID band Patient awake    Reviewed: Allergy & Precautions, NPO status , Patient's Chart, lab work & pertinent test results  Airway Mallampati: II  TM Distance: >3 FB Neck ROM: Full    Dental  (+) Teeth Intact, Dental Advisory Given   Pulmonary sleep apnea , former smoker,    Pulmonary exam normal breath sounds clear to auscultation       Cardiovascular negative cardio ROS Normal cardiovascular exam Rhythm:Regular Rate:Normal     Neuro/Psych Parkinson's disease  negative psych ROS   GI/Hepatic negative GI ROS, Neg liver ROS, RIH   Endo/Other  negative endocrine ROS  Renal/GU negative Renal ROS     Musculoskeletal Paget's disease of bone   Abdominal   Peds  Hematology negative hematology ROS (+)   Anesthesia Other Findings Day of surgery medications reviewed with the patient.  Reproductive/Obstetrics                             Anesthesia Physical Anesthesia Plan  ASA: III  Anesthesia Plan: General   Post-op Pain Management:  Regional for Post-op pain   Induction: Intravenous  PONV Risk Score and Plan: 2 and Dexamethasone and Ondansetron  Airway Management Planned: Oral ETT  Additional Equipment:   Intra-op Plan:   Post-operative Plan: Extubation in OR  Informed Consent: I have reviewed the patients History and Physical, chart, labs and discussed the procedure including the risks, benefits and alternatives for the proposed anesthesia with the patient or authorized representative who has indicated his/her understanding and acceptance.   Dental advisory given  Plan Discussed with: CRNA and Surgeon  Anesthesia Plan Comments:         Anesthesia Quick Evaluation

## 2018-07-14 NOTE — Anesthesia Procedure Notes (Signed)
Anesthesia Regional Block: TAP block   Pre-Anesthetic Checklist: ,, timeout performed, Correct Patient, Correct Site, Correct Laterality, Correct Procedure, Correct Position, site marked, Risks and benefits discussed,  Surgical consent,  Pre-op evaluation,  At surgeon's request and post-op pain management  Laterality: Right  Prep: chloraprep       Needles:  Injection technique: Single-shot  Needle Type: Echogenic Needle     Needle Length: 9cm  Needle Gauge: 21     Additional Needles:   Procedures:,,,, ultrasound used (permanent image in chart),,,,  Narrative:  Start time: 07/14/2018 9:56 AM End time: 07/14/2018 10:01 AM Injection made incrementally with aspirations every 5 mL.  Performed by: Personally  Anesthesiologist: Cecile Hearing, MD  Additional Notes: No pain on injection. No increased resistance to injection. Injection made in 5cc increments.  Good needle visualization.  Patient tolerated procedure well.

## 2018-07-15 ENCOUNTER — Encounter (HOSPITAL_BASED_OUTPATIENT_CLINIC_OR_DEPARTMENT_OTHER): Payer: Self-pay | Admitting: General Surgery

## 2018-07-15 NOTE — Anesthesia Postprocedure Evaluation (Signed)
Anesthesia Post Note  Patient: Mario George  Procedure(s) Performed: OPEN RIGHT INGUINAL HERNIA REPAIR ERAS PATHWAY (Right ) INSERTION OF MESH (Right )     Patient location during evaluation: PACU Anesthesia Type: General Level of consciousness: awake and alert Pain management: pain level controlled Vital Signs Assessment: post-procedure vital signs reviewed and stable Respiratory status: spontaneous breathing, nonlabored ventilation and respiratory function stable Cardiovascular status: blood pressure returned to baseline and stable Postop Assessment: no apparent nausea or vomiting Anesthetic complications: no    Last Vitals:  Vitals:   07/14/18 1300 07/14/18 1336  BP: 129/75 132/67  Pulse: 71 72  Resp: 13 18  Temp:  36.4 C  SpO2: 98% 98%    Last Pain:  Vitals:   07/15/18 1108  TempSrc:   PainSc: 0-No pain                 Cecile Hearing

## 2018-07-15 NOTE — Telephone Encounter (Signed)
Patient states he started taking Carbidopa Levodopa right after the last appt. He is doing well on medication. No side effects. Appt was moved from the beginning of August to the end of August and he will run out on August 15. I made him aware that we would be happy to refill medication til the end of August to make it to his appt, but he would need to keep it because Dr. Arbutus Leas would need to see him on medication to continue it.  FYI - Dr. Arbutus Leas.

## 2018-08-03 ENCOUNTER — Ambulatory Visit: Payer: Medicare Other | Admitting: Neurology

## 2018-08-14 ENCOUNTER — Other Ambulatory Visit: Payer: Self-pay | Admitting: Neurology

## 2018-08-15 ENCOUNTER — Other Ambulatory Visit: Payer: Self-pay | Admitting: Neurology

## 2018-08-17 ENCOUNTER — Other Ambulatory Visit: Payer: Self-pay | Admitting: Neurology

## 2018-08-24 ENCOUNTER — Ambulatory Visit (INDEPENDENT_AMBULATORY_CARE_PROVIDER_SITE_OTHER): Payer: Medicare Other | Admitting: Neurology

## 2018-08-24 ENCOUNTER — Encounter: Payer: Self-pay | Admitting: Neurology

## 2018-08-24 VITALS — BP 120/70 | HR 70 | Ht 74.0 in | Wt 157.0 lb

## 2018-08-24 DIAGNOSIS — G20 Parkinson's disease: Secondary | ICD-10-CM | POA: Diagnosis not present

## 2018-08-24 DIAGNOSIS — R1319 Other dysphagia: Secondary | ICD-10-CM

## 2018-08-24 MED ORDER — CARBIDOPA-LEVODOPA 25-100 MG PO TABS: 1.0000 | ORAL_TABLET | Freq: Three times a day (TID) | ORAL | 1 refills | Status: DC

## 2018-08-24 NOTE — Progress Notes (Signed)
Mario George was seen today in the movement disorders clinic for neurologic consultation at the request of Merlene LaughterStoneking, Hal, MD.  The consultation is for the evaluation of possible Parkinson's disease.  I have reviewed records made available to me.  This patient is accompanied in the office by his spouse who supplements the history.  Specific Symptoms:  Tremor: Yes.   he has noted tremor for about 6 months in the left hand.  It is worse with vigorous exercise.  Wife notes it most with walking and holding steering wheel.  It is present at rest.  Wife noted it occasionally in the right hand.  He is right hand dominant. Family hx of similar:  No. Voice: He has noticed voice changes for about 2 years per patient.  States that he sang a lot as a younger person and noted about 4 years ago he was losing his voice and they were looking for vocal paralysis.  He had been living in [place] and was told that he might need surgery for a vocal cord issue.  He has been trying to hold off on surgery until he could afford to go to [place] city to get it done but then [place] the states.  His PCP told him he thought that this was a bigger issue. Sleep: sleeps well  Vivid Dreams:  No.  Acting out dreams:  No., snores and occasionally talk Wet Pillows: Yes.   Postural symptoms:  No.  Falls?  No. Bradykinesia symptoms: shuffling gait, drooling while awake and difficulty getting out of a chair Loss of smell:  Yes.   Loss of taste:  Yes.   - likes very spicy food Urinary Incontinence:  No. Difficulty Swallowing:  Yes.   but just apples.  Some coughing with meals Handwriting, micrographia: No. Trouble with ADL's:  Yes.   (able to do it all but slower)  Trouble buttoning clothing: No. Depression:  No. Memory changes:  No. Hallucinations:  No.  visual distortions: Yes.   N/V:  No. Lightheaded:  No.  Syncope: No. Diplopia:  No. Dyskinesia:  No.  Neuroimaging of the brain has previously been performed.   They were done in September, 2017 in [place].  No report is available in [place].  These were printed out films from [place] and not on a CD.  These were done without gadolinium.  They were unremarkable.  02/03/18 update: Patient seen today in follow-up.  He is accompanied by his wife who supplements history.  A new diagnosis of Parkinson's disease was made last visit.  He was given carbidopa/levodopa 25/100 to start last visit.  The patient has not started it.  States that he wishes to control the disease with nutrition and holistic methods.  Asks me for referral to physician who does integrative therapy and functional medicine.  Pt denies falls.  Pt denies lightheadedness, near syncope.  No hallucinations.  Mood has been good.  [facility]   [facility] [employer]  He is having some drooling, but not particularly bothersome.  Is having a runny nose.  08/24/18 update: Patient is seen today in follow-up for Parkinson's disease.  He is accompanied by his wife who supplements the history.  Last visit, the patient told me he did not want Parkinson's medication and wanted to manage with pure homeopathic approaches.  However, because he called me mid July and wanted to have his levodopa refilled.  There was some confusion, but ultimately he stated that he started taking it right after our last visit.  He has had no side effects with the medication.  He is not sure it helps.  He takes it 7:30am/3-4pm/10pm.    No lightheadedness or near syncope.  No falls.  Some occasional coughing after eating.  Records have been reviewed since last visit.  He underwent surgery for a right inguinal hernia repair on July 14, 2018.  He recovered well.  He cannot go back to exercise until Sept.  PREVIOUS MEDICATIONS: none to date  ALLERGIES:   Allergies  Allergen Reactions  . Other     Had rash on arm after pneumonia and flu vaccine together - never had either separately prior to  this reaction  . Statins     Weakness/myalgia     CURRENT MEDICATIONS:  Outpatient Encounter Medications as of 08/24/2018  Medication Sig  . b complex vitamins tablet Take 1 tablet by mouth daily.  . carbidopa-levodopa (SINEMET IR) 25-100 MG tablet Take 1 tablet by mouth 3 (three) times daily.  . cholecalciferol (VITAMIN D) 1000 units tablet Take 1,000 Units by mouth daily.  Marland Kitchen co-enzyme Q-10 30 MG capsule Take 100 mg by mouth 3 (three) times daily.  Marland Kitchen HYDROcodone-acetaminophen (NORCO) 5-325 MG tablet Take 1-2 tablets by mouth every 6 (six) hours as needed for moderate pain or severe pain.  . Magnesium 400 MG CAPS Take by mouth.  . Misc Natural Products (FIBER 7 PO) Take by mouth.  . Omega-3 Fatty Acids (FISH OIL) 1000 MG CAPS Take by mouth.  . Probiotic Product (PROBIOTIC-10 PO) Take by mouth.  . vitamin B-12 (CYANOCOBALAMIN) 1000 MCG tablet Take 1,000 mcg by mouth daily.  . [DISCONTINUED] carbidopa-levodopa (SINEMET IR) 25-100 MG tablet TAKE 1 TABLET BY MOUTH THREE TIMES DAILY   No facility-administered encounter medications on file as of 08/24/2018.     PAST MEDICAL HISTORY:   Past Medical History:  Diagnosis Date  . Hypercholesteremia   . Paget's disease of bone   . Parkinson's disease (HCC)   . Right inguinal hernia 07/14/2018  . Sleep apnea    quit cpap in 2017    PAST SURGICAL HISTORY:   Past Surgical History:  Procedure Laterality Date  . COLONOSCOPY    . INGUINAL HERNIA REPAIR Right 07/14/2018   Procedure: OPEN RIGHT INGUINAL HERNIA REPAIR ERAS PATHWAY;  Surgeon: Claud Kelp, MD;  Location: [facility];  Service: General;  Laterality: Right;  . INSERTION OF MESH Right 07/14/2018   Procedure: INSERTION OF MESH;  Surgeon: Claud Kelp, MD;  Location: [facility];  Service: General;  Laterality: Right;  . KNEE SURGERY Right 2006  . VASECTOMY  1975    SOCIAL HISTORY:   Social History   Socioeconomic History  . Marital status:  Married    Spouse name: Not on file  . Number of children: Not on file  . Years of education: Not on file  . Highest education level: Not on file  Occupational History  . Occupation: retired    Comment: health care admin/hospital  Social Needs  . Financial resource strain: Not on file  . Food insecurity:    Worry: Not on file    Inability: Not on file  . Transportation needs:    Medical: Not on file    Non-medical: Not on file  Tobacco Use  . Smoking status: Former Smoker    Last attempt to quit: 12/30/1982  Years since quitting: 35.6  . Smokeless tobacco: Never Used  Substance and Sexual Activity  . Alcohol use: Yes    Comment: one drink (wine or beer) daily  . Drug use: No  . Sexual activity: Not on file  Lifestyle  . Physical activity:    Days per week: Not on file    Minutes per session: Not on file  . Stress: Not on file  Relationships  . Social connections:    Talks on phone: Not on file    Gets together: Not on file    Attends religious service: Not on file    Active member of club or organization: Not on file    Attends meetings of clubs or organizations: Not on file    Relationship status: Not on file  . Intimate partner violence:    Fear of current or ex partner: Not on file    Emotionally abused: Not on file    Physically abused: Not on file    Forced sexual activity: Not on file  Other Topics Concern  . Not on file  Social History Narrative  . Not on file    FAMILY HISTORY:   Family Status  Relation Name Status  . Father  Deceased  . Mother  Deceased  . Sister x3 Alive  . Brother Civil engineer, contracting  . Child  Deceased       trauma  . Child 2 step Alive       step daughters    ROS:  Review of Systems  Constitutional: Negative.   HENT: Negative.   Respiratory: Negative.   Cardiovascular: Negative.   Gastrointestinal: Negative.   Genitourinary: Negative.   Skin: Negative.   Neurological: Positive for tremors.  Endo/Heme/Allergies: Negative.       PHYSICAL EXAMINATION:    VITALS:   Vitals:   08/24/18 1530  BP: 120/70  Pulse: 70  Weight: 157 lb (71.2 kg)  Height: 6\' 2"  (1.88 m)    GEN:  The patient appears stated age and is in NAD. HEENT:  Normocephalic, atraumatic.  The mucous membranes are moist. The superficial temporal arteries are without ropiness or tenderness. CV:  RRR Lungs:  CTAB Neck/HEME:  There are no carotid bruits bilaterally.  Neurological examination:  Orientation: The patient is alert and oriented x3. Cranial nerves: There is good facial symmetry. The speech is fluent and clear. Soft palate rises symmetrically and there is no tongue deviation. Hearing is intact to conversational tone. Sensation: Sensation is intact to light touch throughout Motor: Strength is 5/5 in the bilateral upper and lower extremities.   Shoulder shrug is equal and symmetric.  There is no pronator drift.   Movement examination: Tone: There is normal tone in the UE/LE  Abnormal movements: There is an intermittent left upper extremity resting tremor. Coordination:  There is mild decremation only with L toe taps.  All other RAMs are normal Gait and Station: The patient has no difficulty arising out of a deep-seated chair without the use of the hands. The patient's stride length is normal.  He has a stooped posture.  He has a left upper extremity tremor with ambulation.    ASSESSMENT/PLAN:  1. Idiopathic Parkinson's disease.  The patient has tremor, bradykinesia, rigidity and mild postural instability.  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  Continue carbidopa/levodopa 25/100, 1 tablet 3 times per day but moved dosing times to 7 AM/11 AM/4 PM.  -given infromation  on PARTS program  -Encouraged to get back to exercise when released by surgeon.  -info on apps to help with voice decibels given  2.  Dysphasia  -will order MBE  3.  Vasomotor  Rhinitis  -Discussed the association with Parkinson's disease.  There is really no particular treatment for this.  4.  Sialorrhea  -This is not particularly bothersome.  No Botox needed at this point in time.  5. Follow up is anticipated in the next few months, sooner should new neurologic issues arise.  Much greater than 50% of this visit was spent in counseling and coordinating care.  Total face to face time:  25 min   Cc:  Merlene Laughter, MD

## 2018-08-24 NOTE — Patient Instructions (Signed)
 Community Parkinson's Exercise Programs   Parkinson's Wellness Recovery Exercise Programs:   PWR! Moves PD Exercise Class:  This is a therapist-led exercise class for people with Parkinson's disease in the [place] community. It consists of a one-hour exercise class each week. Classes are offered in eight-week sessions, and the cost per session is $80. Class size is limited to a maximum of 20 participants. Participant criteria includes: Participant must be able to get up and down from the floor with minimal to no assistance, have had 0-1 falls in the past 6 months, and have completed physical or occupational therapy at [facility] within the past year.  To find out more about session dates, questions, or to register, please contact Amy Marriott, Physical Therapist, or Denise Robertson, Physical Therapist Assistant, at [facility] at [phone].  PWR! Circuit Class:  This is a therapist-led exercise class with intervals of circuit activities incorporating PWR! Moves into functional activities. It consists of one 45-minute exercise class per week. Classes are offered in eight-week sessions, and the cost per session is $120. Class size is limited to a maximum of eight participants to allow for hands-on instruction. Participant criteria: class is ideal for people with Parkinson's disease who have completed PWR! Moves Exercise Class or who are currently independently exercising and want to be challenged, must be able to walk independently with 0-1 falls in the past 6 months, able to get up and down from the floor independently, able to sit to stand independently, and able to jog 20 feet.   To find out more about session dates, questions, or to register, please contact Amy Marriott, Physical Therapist, or Denise Robertson, Physical Therapist Assistant, at  [facility] at [phone].   [employer] Parkinson's Cycle:    Parkinson's Cycle Class at [facility] This is an ongoing class on Monday and Thursday mornings at 10:45 a.m. A healthcare provider referral is required to enroll. This class is FREE to participants, and you do not have to be a member of the [employer] to enroll. Contact Beth at [phone] or [email or web address]. Parkinson's Cycle Class at [facility] [employer] Ongoing Class Monday, Wednesday, and Friday mornings at 9:00 a.m. A healthcare provider referral is required to enroll. This class is FREE to participants, and you do not have to be a member of the [employer] to enroll. Contact Marlee at [phone] or [email or web address]. Parkinson's Cycle Class at [facility] Ongoing Class every Friday mornings at 12 p.m.  A healthcare provider referral is required to enroll. This class is FREE to participants, and you do not have to be a member of the [employer] to enroll. Contact [phone].  Parkinson's Cycle Class at [facility] Ongoing Class every Monday at 12pm.  A healthcare provider referral is required to enroll. This class is FREE to participants, and you do not have to be a member of the [employer] to enroll. Contact Julie at [phone] or  [email or web address].   Rock Ste[employer] Boxing:  [employer] [place]  Classes are offered Mondays at 5:15 p.m. and Tuesdays and Thursdays at 12 p.m. at [facility] [employer]. For more information, contact [phone] or visit [email or web address] or [email or web address]. [employer] [place] Classes are offered Monday, Wednesday, and Friday from 9:30 a.m. - 11:00 a.m. For more information, contact [phone] or [phone] or email [email or web address] or visit [email or web address] or [email or web address]/. [employer] [place] (classes are offered at 2 locations) . [employer] in [place] (for more information,   contact Thad Stovall at  [phone] or email [email or web address] . [place] at [facility] (class is open to the public -- for more information, contact Michael Cain at [phone] or email [email or web address]) [employer] [place] Classes are held at [facility] in [place], [place]. For more information, call Dr. Laura Beck at [phone] or [email or web address].   Personal Training for Parkinson's:   [employer] Offers certified personal training to customize a program to meet your exercise needs to address Parkinson's disease. For more information, contact [phone] or visit [email or web address].  Community Dance for Parkinson's:   Community dance class for people with Parkinson's Disease Wednesdays at 9 a.m. [employer] [address] [place], [place] [address] Please contact Christina Soriano [phone] for more information  Scholarships Available for Fitness Programs:  The [employer] for Parkinston's is a non-profit 501(C)3 organization run by volunteers, whose mission is to strive to empower those living with Parkinson's Disease (PD), Progressive Supra-Nuclear Palsy (PSP) and Multiple System Atrophy (MSA).  Through financial support, recipients benefit from individual and group programs. [phone] [email or web address]  

## 2018-08-25 ENCOUNTER — Telehealth: Payer: Self-pay | Admitting: Psychology

## 2018-08-25 ENCOUNTER — Telehealth: Payer: Self-pay | Admitting: Neurology

## 2018-08-25 NOTE — Telephone Encounter (Signed)
Patient made aware of appt information.

## 2018-08-25 NOTE — Telephone Encounter (Signed)
We have scheduled you at [facility] for your modified barium swallow on 09/03/18 at 11:00 am. Please arrive 15 minutes prior and go to 1st floor radiology. If you need to reschedule for any reason please call [phone].  Need to make patient aware of appt information above.

## 2018-08-25 NOTE — Telephone Encounter (Signed)
Telephone call with patient today to respond to questions that he had about voice decibel acts on a smart phone.  I reached out to our speech therapist who shared that he has used the Bosch sound level meter in the past and preferred the readout.  In addition, he recommended that the patient download a couple of the I did explore and find the one that he prefers using.  I provided him with a couple of names of apps that help such as the voice analyst, the Parkinson's life kit the loud and clear speech exercises.  The patient has a android smart phonew which may offer different apps.  I encouraged him to download a couple of the different voice and speech out on his phone and see if there is any that he preferred more than others.  If he needs more help with this I am more than happy to research on a deeper level.        

## 2018-08-26 ENCOUNTER — Ambulatory Visit: Payer: Medicare Other | Admitting: Neurology

## 2018-08-28 ENCOUNTER — Other Ambulatory Visit (HOSPITAL_COMMUNITY): Payer: Self-pay | Admitting: Neurology

## 2018-08-28 DIAGNOSIS — R1310 Dysphagia, unspecified: Secondary | ICD-10-CM

## 2018-09-03 ENCOUNTER — Ambulatory Visit (HOSPITAL_COMMUNITY)
Admission: RE | Admit: 2018-09-03 | Discharge: 2018-09-03 | Disposition: A | Discharge status: Home / Self Care | Payer: Medicare Other | Source: Ambulatory Visit | Attending: Neurology | Admitting: Neurology

## 2018-09-03 DIAGNOSIS — R1312 Dysphagia, oropharyngeal phase: Secondary | ICD-10-CM | POA: Diagnosis not present

## 2018-09-03 DIAGNOSIS — G20 Parkinson's disease: Secondary | ICD-10-CM

## 2018-09-03 DIAGNOSIS — R1310 Dysphagia, unspecified: Secondary | ICD-10-CM

## 2018-09-03 NOTE — Progress Notes (Addendum)
Objective Swallowing Evaluation: Type of Study: MBS-Modified Barium Swallow Study   Patient Details  Name: Mario George MRN: [id number] Date of Birth: 12-20-1941  Today's Date: 09/03/2018 Time: SLP Start Time (ACUTE ONLY): 1105 -SLP Stop Time (ACUTE ONLY): 1126  SLP Time Calculation (min) (ACUTE ONLY): 21 min   Past Medical History:  Past Medical History:  Diagnosis Date  . Hypercholesteremia   . Paget's disease of bone   . Parkinson's disease (HCC)   . Right inguinal hernia 07/14/2018  . Sleep apnea    quit cpap in 2017   Past Surgical History:  Past Surgical History:  Procedure Laterality Date  . COLONOSCOPY    . INGUINAL HERNIA REPAIR Right 07/14/2018   Procedure: OPEN RIGHT INGUINAL HERNIA REPAIR ERAS PATHWAY;  Surgeon: Claud Kelp, MD;  Location: [facility];  Service: General;  Laterality: Right;  . INSERTION OF MESH Right 07/14/2018   Procedure: INSERTION OF MESH;  Surgeon: Claud Kelp, MD;  Location: [facility];  Service: General;  Laterality: Right;  . KNEE SURGERY Right 2006  . VASECTOMY  1975   HPI: Pt arrives for OP MBS given dx of Parkinsons. Pt admits to occasional coughing with meals, hypophonia otherwise no concerns with swallowing.    No data recorded   Assessment / Plan / Recommendation  CHL IP CLINICAL IMPRESSIONS 09/03/2018  Clinical Impression Pt demonstrates adequate airway protection with mild impariment of swallow function including adequate strength, but slightly decreased ROM, likely from rigidity. This leads to mild vallecular and pyriform sinus residue that pt does not sense. With one verbal cue and visual feedback given pt consistnelty initiated a second swallow to clear residue effectively. A chin tuck was trialed without improvement. Pt recommended to continue his typical diet with added second, effortful swallow. May benefit from f/u with OP SLP to initiate appropriate exercise program in setting of progressive  neuromuscular disease. Discussed verbally with pt and wife.   SLP Visit Diagnosis Dysphagia, oropharyngeal phase (R13.12)  Attention and concentration deficit following --  Frontal lobe and executive function deficit following --  Impact on safety and function Mild aspiration risk      No flowsheet data found.   No flowsheet data found.  CHL IP DIET RECOMMENDATION 09/03/2018  SLP Diet Recommendations Regular solids;Thin liquid  Liquid Administration via Cup;Straw  Medication Administration Whole meds with liquid  Compensations Effortful swallow;Multiple dry swallows after each bite/sip  Postural Changes --      CHL IP OTHER RECOMMENDATIONS 09/03/2018  Recommended Consults --  Oral Care Recommendations Patient independent with oral care  Other Recommendations --      CHL IP FOLLOW UP RECOMMENDATIONS 09/03/2018  Follow up Recommendations Outpatient SLP      No flowsheet data found.         CHL IP ORAL PHASE 09/03/2018  Oral Phase WFL  Oral - Pudding Teaspoon --  Oral - Pudding Cup --  Oral - Honey Teaspoon --  Oral - Honey Cup --  Oral - Nectar Teaspoon --  Oral - Nectar Cup --  Oral - Nectar Straw --  Oral - Thin Teaspoon --  Oral - Thin Cup --  Oral - Thin Straw --  Oral - Puree --  Oral - Mech Soft --  Oral - Regular --  Oral - Multi-Consistency --  Oral - Pill --  Oral Phase - Comment --    CHL IP PHARYNGEAL PHASE 09/03/2018  Pharyngeal Phase Impaired  Pharyngeal- Pudding Teaspoon --  Pharyngeal --  Pharyngeal- Pudding Cup --  Pharyngeal --  Pharyngeal- Honey Teaspoon --  Pharyngeal --  Pharyngeal- Honey Cup --  Pharyngeal --  Pharyngeal- Nectar Teaspoon --  Pharyngeal --  Pharyngeal- Nectar Cup --  Pharyngeal --  Pharyngeal- Nectar Straw --  Pharyngeal --  Pharyngeal- Thin Teaspoon --  Pharyngeal --  Pharyngeal- Thin Cup Pharyngeal residue - valleculae;Pharyngeal residue - pyriform  Pharyngeal --  Pharyngeal- Thin Straw Pharyngeal residue -  valleculae;Pharyngeal residue - pyriform  Pharyngeal --  Pharyngeal- Puree Pharyngeal residue - valleculae;Pharyngeal residue - pyriform  Pharyngeal --  Pharyngeal- Mechanical Soft --  Pharyngeal --  Pharyngeal- Regular Pharyngeal residue - valleculae;Pharyngeal residue - pyriform  Pharyngeal --  Pharyngeal- Multi-consistency --  Pharyngeal --  Pharyngeal- Pill --  Pharyngeal --  Pharyngeal Comment --     CHL IP CERVICAL ESOPHAGEAL PHASE 09/03/2018  Cervical Esophageal Phase WFL  Pudding Teaspoon --  Pudding Cup --  Honey Teaspoon --  Honey Cup --  Nectar Teaspoon --  Nectar Cup --  Nectar Straw --  Thin Teaspoon --  Thin Cup --  Thin Straw --  Puree --  Mechanical Soft --  Regular --  Multi-consistency --  Pill --  Cervical Esophageal Comment --    Harlon Ditty, MA CCC-SLP [phone]  [name], Riley Nearing 09/03/2018, 1:22 PM

## 2018-11-17 ENCOUNTER — Other Ambulatory Visit: Payer: Self-pay | Admitting: Neurology

## 2018-12-07 ENCOUNTER — Telehealth: Payer: Self-pay | Admitting: Neurology

## 2018-12-07 NOTE — Telephone Encounter (Signed)
Received MRI brain results from [facility].  Unsure why this was ordered.  Report states that there was mild small vessel disease.  Remote microhemorrhages within the subcortical lateral right frontal lobe due to a possible remote hypertensive hemorrhage or secondary to remote trauma.  No films

## 2019-01-25 ENCOUNTER — Ambulatory Visit: Payer: Medicare Other | Admitting: Neurology

## 2019-02-08 NOTE — Progress Notes (Signed)
Mario George was seen today in the movement disorders clinic for neurologic consultation at the request of Merlene LaughterStoneking, Hal, MD.  The consultation is for the evaluation of possible Parkinson's disease.  I have reviewed records made available to me.  This patient is accompanied in the office by his spouse who supplements the history.  Specific Symptoms:  Tremor: Yes.   he has noted tremor for about 6 months in the left hand.  It is worse with vigorous exercise.  Wife notes it most with walking and holding steering wheel.  It is present at rest.  Wife noted it occasionally in the right hand.  He is right hand dominant. Family hx of similar:  No. Voice: He has noticed voice changes for about 2 years per patient.  States that he sang a lot as a younger person and noted about 4 years ago he was losing his voice and they were looking for vocal paralysis.  He had been living in [place] and was told that he might need surgery for a vocal cord issue.  He has been trying to hold off on surgery until he could afford to go to [place] city to get it done but then they [place] back [place] the states.  His PCP told him he thought that this was a bigger issue. Sleep: sleeps well  Vivid Dreams:  No.  Acting out dreams:  No., snores and occasionally talk Wet Pillows: Yes.   Postural symptoms:  No.  Falls?  No. Bradykinesia symptoms: shuffling gait, drooling while awake and difficulty getting out of a chair Loss of smell:  Yes.   Loss of taste:  Yes.   - likes very spicy food Urinary Incontinence:  No. Difficulty Swallowing:  Yes.   but just apples.  Some coughing with meals Handwriting, micrographia: No. Trouble with ADL's:  Yes.   (able to do it all but slower)  Trouble buttoning clothing: No. Depression:  No. Memory changes:  No. Hallucinations:  No.  visual distortions: Yes.   N/V:  No. Lightheaded:  No.  Syncope: No. Diplopia:  No. Dyskinesia:  No.  Neuroimaging of the brain has previously been performed.   They were done in September, 2017 in [place].  No report is available in [place].  These were printed out films from [place] and not on a CD.  These were done without gadolinium.  They were unremarkable.  02/03/18 update: Patient seen today in follow-up.  He is accompanied by his wife who supplements history.  A new diagnosis of Parkinson's disease was made last visit.  He was given carbidopa/levodopa 25/100 to start last visit.  The patient has not started it.  States that he wishes to control the disease with nutrition and holistic methods.  Asks me for referral to physician who does integrative therapy and functional medicine.  Pt denies falls.  Pt denies lightheadedness, near syncope.  No hallucinations.  Mood has been good.  He is doing cycling 3 times per week at the [employer].   He is doing pickleball at the [employer].  He is having some drooling, but not particularly bothersome.  Is having a runny nose.  08/24/18 update: Patient is seen today in follow-up for Parkinson's disease.  He is accompanied by his wife who supplements the history.  Last visit, the patient told me he did not want Parkinson's medication and wanted to manage with pure homeopathic approaches.  However, because he called me mid July and wanted to have his levodopa refilled.  There was some confusion, but ultimately he stated that he started taking it right after our last visit.  He has had no side effects with the medication.  He is not sure it helps.  He takes it 7:30am/3-4pm/10pm.    No lightheadedness or near syncope.  No falls.  Some occasional coughing after eating.  Records have been reviewed since last visit.  He underwent surgery for a right inguinal hernia repair on July 14, 2018.  He recovered well.  He cannot go back to exercise until Sept.  02/09/19 update: Patient is seen today in follow-up for Parkinson's disease, accompanied by his wife who supplements the history.  The patient is on carbidopa/levodopa 25/100, 1 tablet at 8 AM/12  pm/5 PM (change dosing time last visit as they were spread out too far).  The patient has been doing well since our last visit.  No falls.  No lightheadedness or near syncope.  He is just walking for exercise.   Modified barium swallow was done on September 03, 2018.  This demonstrated adequate airway protection with mild impairment of swallowing function.  There was mild oropharyngeal phase dysphagia.  He was felt to be a mild aspiration risk.  Since our last visit, I did receive copies of a report from an MRI of the brain dated December 04, 2018 done at [facility].  I do not know why this was ordered.  Report stated that there was mild small vessel disease and remote microhemorrhages within the subcortical lateral right frontal lobe due to a possible remote hypertensive hemorrhage or secondary to remote trauma.  No films were available.  Interestingly, the patient also had a lumbar puncture done through [facility] on December 18, 2018 and January 14, 2019.  The indications for the lumbar puncture were "Parkinson's disease."  It appears that he is entered into our research trial, requiring lumbar punctures.  No notes are available.  They are planning to move to [place] at the end of the month.  Pt reports it was a 30 day in patient study over christmas.  He was given med vs placebo.  He doesn't know which arm he was in.    PREVIOUS MEDICATIONS: none to date  ALLERGIES:   Allergies  Allergen Reactions  . Other     Had rash on arm after pneumonia and flu vaccine together - never had either separately prior to this reaction  . Statins     Weakness/myalgia     CURRENT MEDICATIONS:  Outpatient Encounter Medications as of 02/09/2019  Medication Sig  . b complex vitamins tablet Take 1 tablet by mouth daily.  . carbidopa-levodopa (SINEMET IR) 25-100 MG tablet TAKE 1 TABLET BY MOUTH THREE TIMES DAILY  . cholecalciferol (VITAMIN D) 1000 units tablet Take 1,000 Units by mouth daily.  Marland Kitchen  co-enzyme Q-10 30 MG capsule Take 100 mg by mouth 3 (three) times daily.  Marland Kitchen HYDROcodone-acetaminophen (NORCO) 5-325 MG tablet Take 1-2 tablets by mouth every 6 (six) hours as needed for moderate pain or severe pain.  . Magnesium 400 MG CAPS Take by mouth.  . Misc Natural Products (FIBER 7 PO) Take by mouth.  . Omega-3 Fatty Acids (FISH OIL) 1000 MG CAPS Take by mouth.  . Probiotic Product (PROBIOTIC-10 PO) Take by mouth.  . vitamin B-12 (CYANOCOBALAMIN) 1000 MCG tablet Take 1,000 mcg by mouth daily.   No facility-administered encounter medications on file as of 02/09/2019.     PAST MEDICAL HISTORY:   Past Medical History:  Diagnosis Date  . Hypercholesteremia   . Paget's disease of bone   . Parkinson's disease (HCC)   . Right inguinal hernia 07/14/2018  . Sleep apnea    quit cpap in 2017    PAST SURGICAL HISTORY:   Past Surgical History:  Procedure Laterality Date  . COLONOSCOPY    . INGUINAL HERNIA REPAIR Right 07/14/2018   Procedure: OPEN RIGHT INGUINAL HERNIA REPAIR ERAS PATHWAY;  Surgeon: Claud Kelp, MD;  Location: [facility];  Service: General;  Laterality: Right;  . INSERTION OF MESH Right 07/14/2018   Procedure: INSERTION OF MESH;  Surgeon: Claud Kelp, MD;  Location: [facility];  Service: General;  Laterality: Right;  . KNEE SURGERY Right 2006  . VASECTOMY  1975    SOCIAL HISTORY:   Social History   Socioeconomic History  . Marital status: Married    Spouse name: Not on file  . Number of children: Not on file  . Years of education: Not on file  . Highest education level: Not on file  Occupational History  . Occupation: retired    Comment: health care admin/hospital  Social Needs  . Financial resource strain: Not on file  . Food insecurity:    Worry: Not on file    Inability: Not on file  . Transportation needs:    Medical: Not on file    Non-medical: Not on file  Tobacco Use  . Smoking status: Former Smoker    Last  attempt to quit: 12/30/1982    Years since quitting: 36.1  . Smokeless tobacco: Never Used  Substance and Sexual Activity  . Alcohol use: Yes    Comment: one drink (wine or beer) daily  . Drug use: No  . Sexual activity: Not on file  Lifestyle  . Physical activity:    Days per week: Not on file    Minutes per session: Not on file  . Stress: Not on file  Relationships  . Social connections:    Talks on phone: Not on file    Gets together: Not on file    Attends religious service: Not on file    Active member of club or organization: Not on file    Attends meetings of clubs or organizations: Not on file    Relationship status: Not on file  . Intimate partner violence:    Fear of current or ex partner: Not on file    Emotionally abused: Not on file    Physically abused: Not on file    Forced sexual activity: Not on file  Other Topics Concern  . Not on file  Social History Narrative  . Not on file    FAMILY HISTORY:   Family Status  Relation Name Status  . Father  Deceased  . Mother  Deceased  . Sister x3 Alive  . Brother Civil engineer, contracting  . Child  Deceased       trauma  . Child 2 step Alive       step daughters    ROS:  Review of Systems  Constitutional: Negative.   HENT: Negative.   Eyes: Negative.   Respiratory: Negative.   Cardiovascular: Negative.   Gastrointestinal: Negative.   Genitourinary: Negative.   Musculoskeletal: Negative.   Skin: Negative.      PHYSICAL EXAMINATION:    VITALS:   Vitals:   02/09/19 0802  BP: 100/60  Pulse: 73  SpO2: 97%  Weight: 165 lb 4 oz (75 kg)  Height: 6\' 2"  (  1.88 m)    GEN:  The patient appears stated age and is in NAD. HEENT:  Normocephalic, atraumatic.  The mucous membranes are moist. The superficial temporal arteries are without ropiness or tenderness. CV:  RRR Lungs:  CTAB Neck/HEME:  There are no carotid bruits bilaterally.  Neurological examination:  Orientation: The patient is alert and oriented x3. Cranial  nerves: There is good facial symmetry. The speech is fluent and clear. Soft palate rises symmetrically and there is no tongue deviation. Hearing is intact to conversational tone. Sensation: Sensation is intact to light touch throughout Motor: Strength is 5/5 in the bilateral upper and lower extremities.   Shoulder shrug is equal and symmetric.  There is no pronator drift.  Movement examination: Tone: There is normal tone in the UE/LE  Abnormal movements: There is tremor in the L hand only with ambulation Coordination:  There is no decremation, with any form of RAMS, including alternating supination and pronation of the forearm, hand opening and closing, finger taps, heel taps and toe taps bilaterally Gait and Station: The patient has no difficulty arising out of a deep-seated chair without the use of the hands. The patient's stride length is normal.  He has a stooped posture.  He has a left upper extremity tremor with ambulation.    ASSESSMENT/PLAN:  1. Idiopathic Parkinson's disease.  The patient has tremor, bradykinesia, rigidity and mild postural instability.  -continue carbidopa/levodopa 25/100, 1 po tid.  Risks, benefits, side effects and alternative therapies were discussed.  The opportunity to ask questions was given and they were answered to the best of my ability.  The patient expressed understanding and willingness to follow the outlined treatment protocols.  2.  Dysphasia  -Modified barium swallow was done on September 03, 2018.  This demonstrated adequate airway protection with mild impairment of swallowing function.  There was mild oropharyngeal phase dysphagia.  He was felt to be a mild aspiration risk.  3.  Vasomotor Rhinitis  -Discussed the association with Parkinson's disease.  There is really no particular treatment for this.  4.  Sialorrhea  -This is not particularly bothersome.  No Botox needed at this point in time.  5. Pt moving to [place].  Will let me know if he needs a  referral to new neurologist once he gets there.   Cc:  Merlene Laughter, MD

## 2019-02-09 ENCOUNTER — Encounter: Payer: Self-pay | Admitting: Neurology

## 2019-02-09 ENCOUNTER — Ambulatory Visit (INDEPENDENT_AMBULATORY_CARE_PROVIDER_SITE_OTHER): Payer: Medicare Other | Admitting: Neurology

## 2019-02-09 VITALS — BP 100/60 | HR 73 | Ht 74.0 in | Wt 165.2 lb

## 2019-02-09 DIAGNOSIS — G20 Parkinson's disease: Secondary | ICD-10-CM | POA: Diagnosis not present

## 2019-02-09 MED ORDER — CARBIDOPA-LEVODOPA 25-100 MG PO TABS: 1.0000 | ORAL_TABLET | Freq: Three times a day (TID) | ORAL | 1 refills | Status: DC

## 2020-04-20 IMAGING — RF DG SWALLOWING FUNCTION
12 of 16 series · 12 of 24 positions shown · non-contrast
Comparison: None.

CLINICAL DATA: 77-year-old male with Parkinson's. Dysphagia with
foods, coughing with meals.

EXAM:
MODIFIED BARIUM SWALLOW
TECHNIQUE: Different consistencies of barium were administered orally to the
patient by the Speech Pathologist. Imaging of the pharynx was
performed in the lateral projection.
FLUOROSCOPY TIME:  Fluoroscopy Time:  1 minutes 32 seconds
Radiation Exposure Index (if provided by the fluoroscopic device):
Number of Acquired Spot Images: 0

[Series 1: run · 1 of 42 frames shown (1 of 12)]
[frame 36/42]
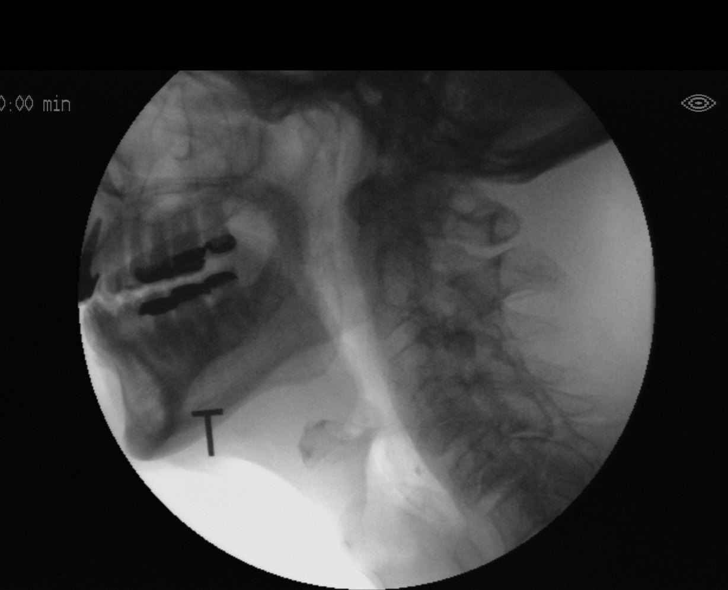

[Series 3: run · 1 of 95 frames shown (2 of 12)]
[frame 15/95]
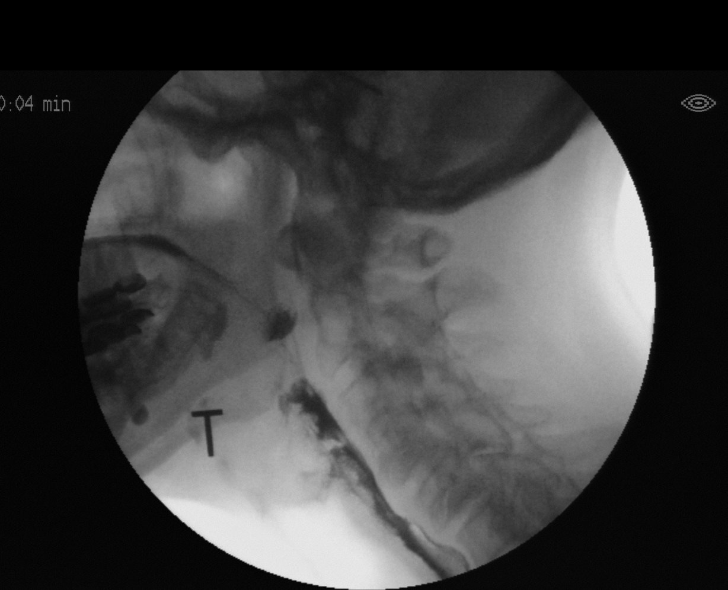

[Series 4: run · 1 of 36 frames shown (3 of 12)]
[frame 13/36]
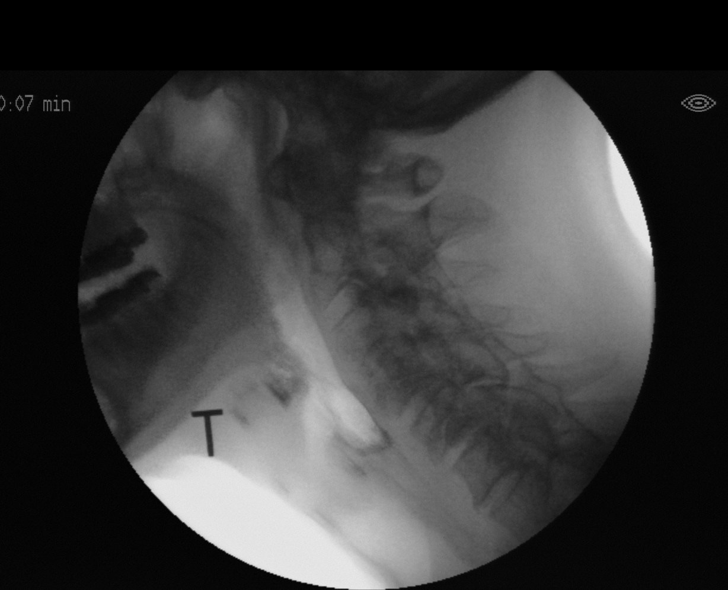

[Series 5: run · 1 of 253 frames shown (4 of 12)]
[frame 216/253]
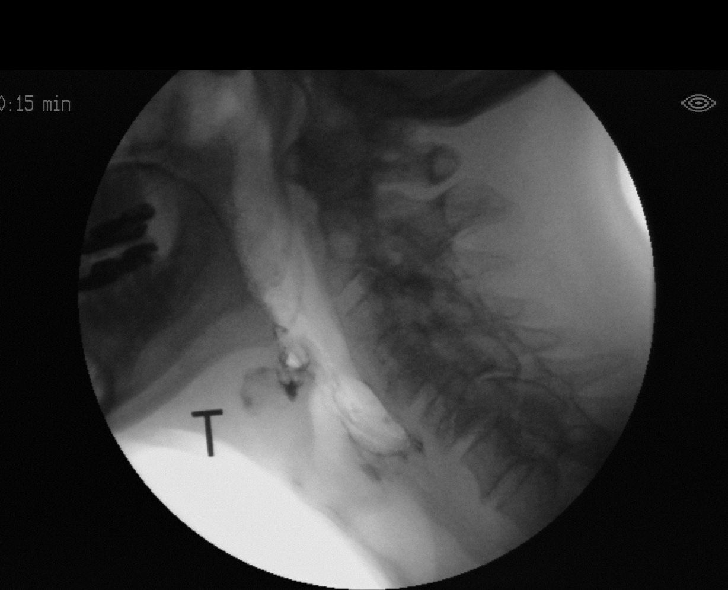

[Series 7: run · 1 of 110 frames shown (5 of 12)]
[frame 8/110]
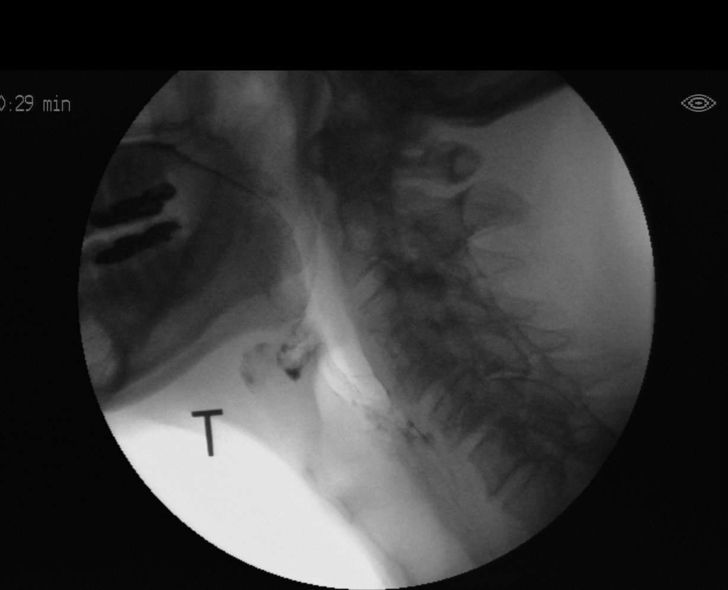

[Series 8: run · 1 of 114 frames shown (6 of 12)]
[frame 58/114]
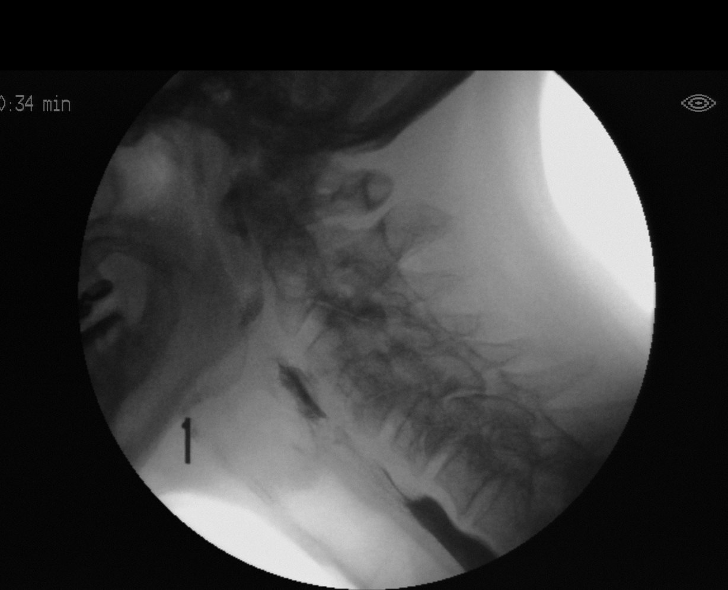

[Series 9: run · 1 of 155 frames shown (7 of 12)]
[frame 132/155]
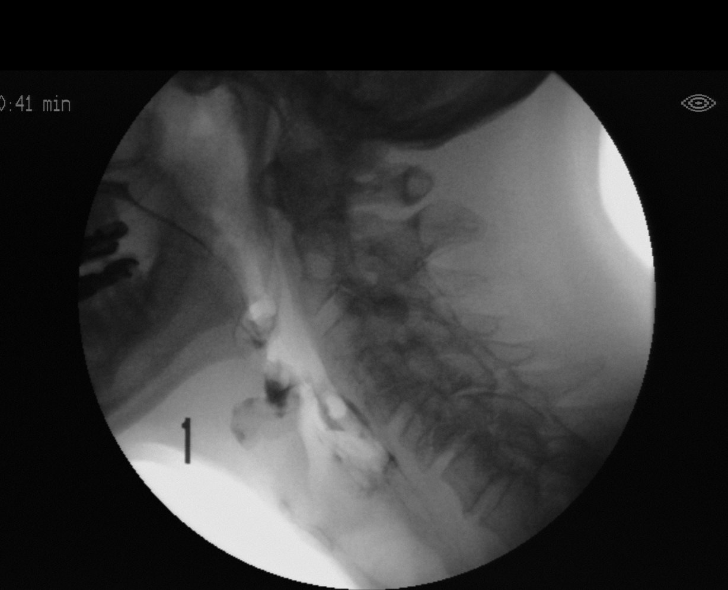

[Series 11: run · 1 of 138 frames shown (8 of 12)]
[frame 1/138]
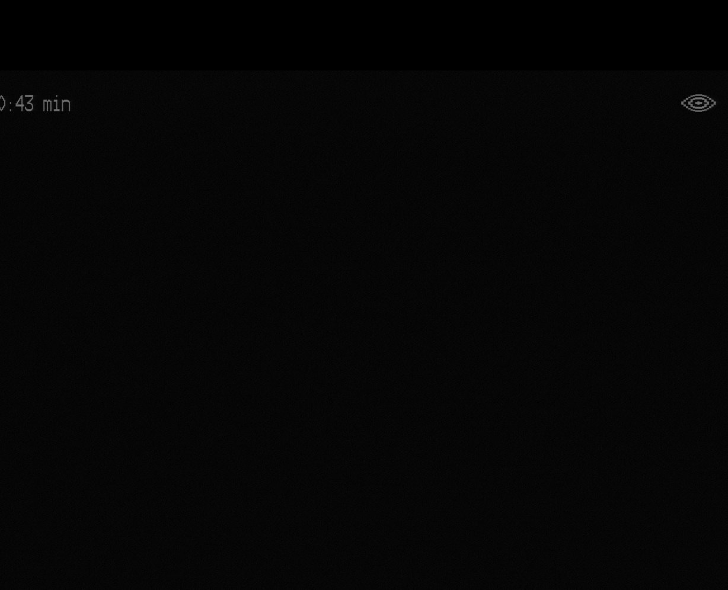

[Series 12: run · 1 of 73 frames shown (9 of 12)]
[frame 63/73]
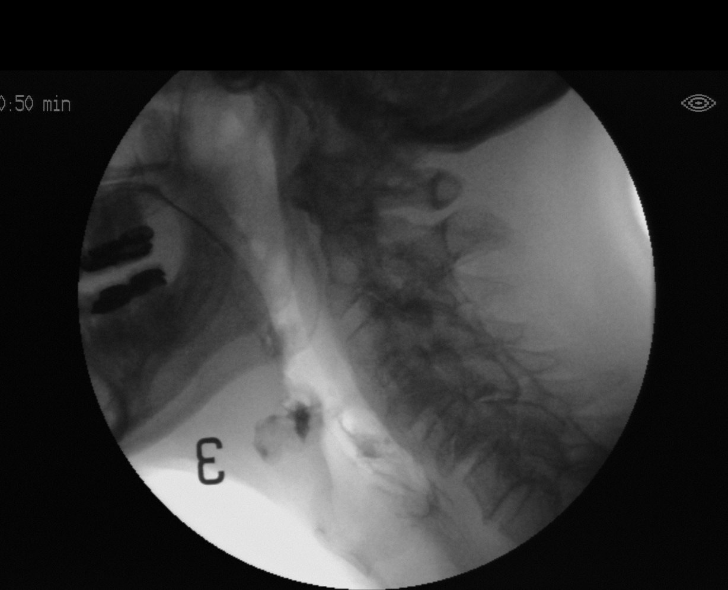

[Series 13: run · 1 of 391 frames shown (10 of 12)]
[frame 333/391]
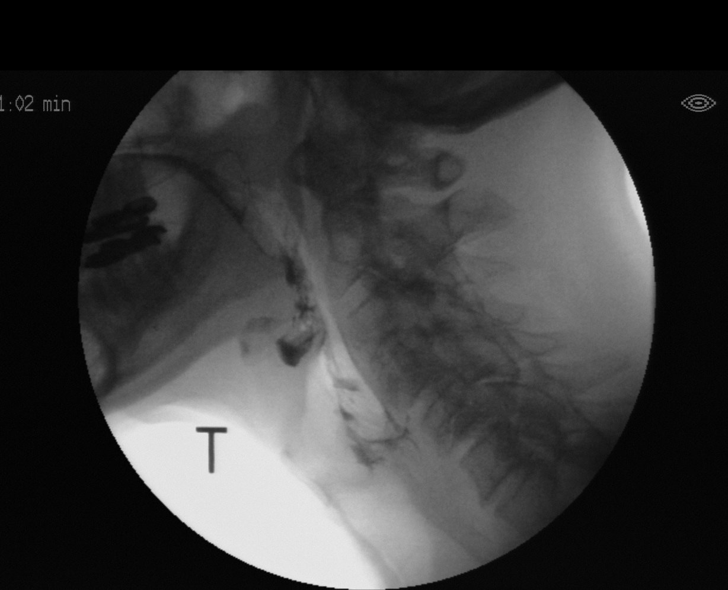

[Series 15: run · 1 of 32 frames shown (11 of 12)]
[frame 5/32]
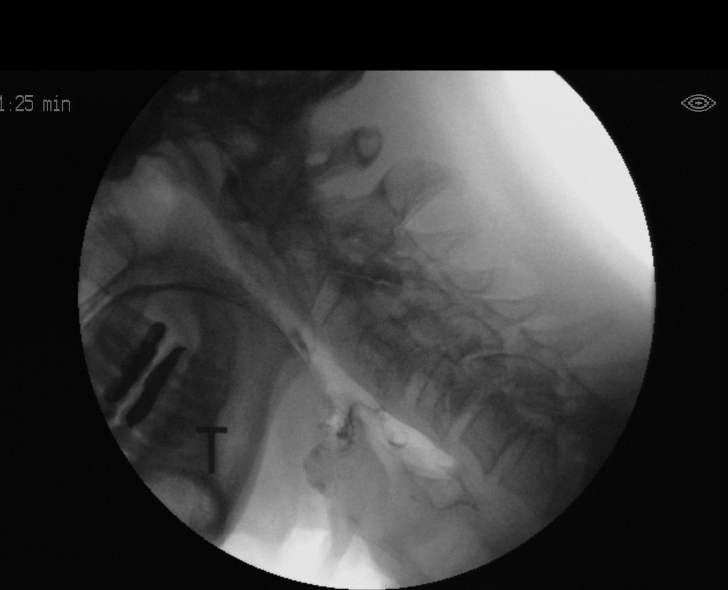

[Series 16: run · 1 of 201 frames shown (12 of 12)]
[frame 171/201]
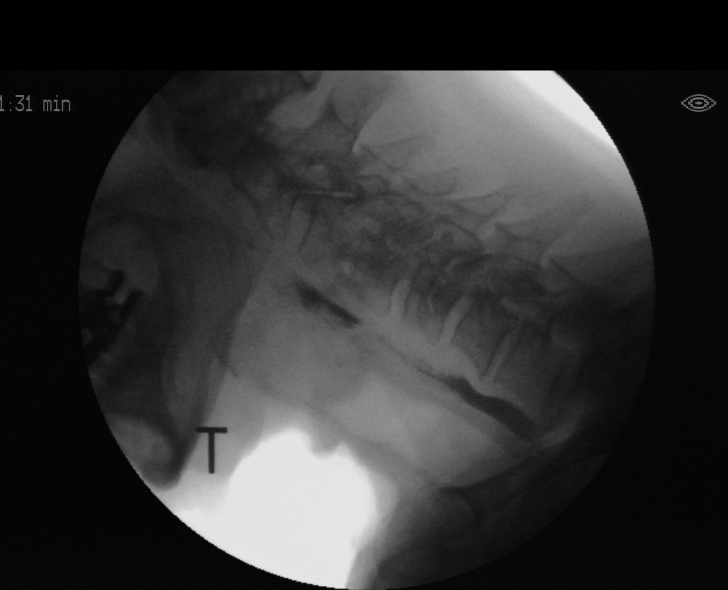

[12 of 24 positions shown; findings below may reference images not displayed]

FINDINGS: Thin liquid-normal aside from mild retention, most often the
vallecula.

Pure-mild retention as seen with thin liquids.

Cracker-similar mild retention, otherwise normal.

Barium tablet -  within normal limits
IMPRESSION: Mild retention of all consistencies, but no penetration or
aspiration.

Please refer to the Speech Pathologists report for complete details
and recommendations.

## 2022-06-12 ENCOUNTER — Encounter: Payer: Self-pay | Admitting: Family Medicine

## 2022-06-12 ENCOUNTER — Ambulatory Visit (INDEPENDENT_AMBULATORY_CARE_PROVIDER_SITE_OTHER): Payer: Medicare Other | Admitting: Family Medicine

## 2022-06-12 VITALS — BP 79/56 | HR 74 | Temp 97.8°F | Ht 72.0 in | Wt 157.0 lb

## 2022-06-12 DIAGNOSIS — H9193 Unspecified hearing loss, bilateral: Secondary | ICD-10-CM

## 2022-06-12 DIAGNOSIS — H532 Diplopia: Secondary | ICD-10-CM

## 2022-06-12 DIAGNOSIS — G20 Parkinson's disease: Secondary | ICD-10-CM | POA: Diagnosis not present

## 2022-06-12 DIAGNOSIS — I951 Orthostatic hypotension: Secondary | ICD-10-CM

## 2022-06-12 DIAGNOSIS — H6122 Impacted cerumen, left ear: Secondary | ICD-10-CM | POA: Diagnosis not present

## 2022-06-12 LAB — CBC
HCT: 40 % (ref 39.0–52.0)
Hemoglobin: 13.4 g/dL (ref 13.0–17.0)
MCHC: 33.6 g/dL (ref 30.0–36.0)
MCV: 94.4 fl (ref 78.0–100.0)
Platelets: 174 10*3/uL (ref 150.0–400.0)
RBC: 4.24 Mil/uL (ref 4.22–5.81)
RDW: 14.2 % (ref 11.5–15.5)
WBC: 4.5 10*3/uL (ref 4.0–10.5)

## 2022-06-12 LAB — COMPREHENSIVE METABOLIC PANEL
ALT: 8 U/L (ref 0–53)
AST: 17 U/L (ref 0–37)
Albumin: 4.2 g/dL (ref 3.5–5.2)
Alkaline Phosphatase: 88 U/L (ref 39–117)
BUN: 24 mg/dL — ABNORMAL HIGH (ref 6–23)
CO2: 29 mEq/L (ref 19–32)
Calcium: 9.2 mg/dL (ref 8.4–10.5)
Chloride: 104 mEq/L (ref 96–112)
Creatinine, Ser: 0.87 mg/dL (ref 0.40–1.50)
GFR: 80.97 mL/min (ref >60.00)
Glucose, Bld: 80 mg/dL (ref 70–99)
Potassium: 4.3 mEq/L (ref 3.5–5.1)
Sodium: 140 mEq/L (ref 135–145)
Total Bilirubin: 0.5 mg/dL (ref 0.2–1.2)
Total Protein: 7.4 g/dL (ref 6.0–8.3)

## 2022-06-12 MED ORDER — CARBIDOPA-LEVODOPA ER 50-200 MG PO TBCR: 1.0000 | EXTENDED_RELEASE_TABLET | Freq: Every day | ORAL | 1 refills | Status: DC

## 2022-06-12 NOTE — Patient Instructions (Signed)
Stop your tamsulosin for 3-4 days to see if your dizziness and low blood pressure improve. Monitor blood pressure and pulse 2-3 times a day so we can review in 1 wk.

## 2022-06-12 NOTE — Progress Notes (Signed)
Office Note 06/12/2022  CC:  Chief Complaint  Patient presents with   [name] by from [place] in May this year   Dizziness    Dizzy spells in the morning off and on for a year or so; more frequently in the last 3 months. Eyes are not focused well, recording blood pressures on his phone. Blood pressures have been low. Most recent one was taken on 5/11; 55/34 p 63, 68/41 p 70, 62/43 p 51 59/39 p 68, 62/36 p 70.  A 1 hr later bp was 133/68 p 68   HPI:  Mario George is a 81 y.o. male who is here accompanied by his wife Mario George to establish care and discuss dizziness.  Patient's most recent primary MD: Dr. Felipa Eth. Old records were reviewed prior to or during today's visit.  Most recent CPE per patient report was October 2022 by his PCP in a different state.  Mario George reports a problem with lightheadedness upon standing that occurs every morning for 1 to 2 hours.  Says during that time his blood pressure drops into the 38G to 66Z systolic.  Says heart rates in the 50s to 70s.  He does not feel presyncopal during these times. no palpitations.  No chest pain.  No headaches. Denies any changes in his tremors or rigidity.  No nausea, jaw pain, or arm pain.  He is on tamsulosin 0.4 mg nightly and says this has helped decrease his nocturia from 8 times a night to 3 times a night.  Additionally, he states that his ears feel muffled lately and he has a history of cerumen impactions.   Past Medical History:  Diagnosis Date   BPH with obstruction/lower urinary tract symptoms    Eczema    Legs.  Triamcinolone   GERD (gastroesophageal reflux disease)    Osteoarthritis, multiple sites    Paget's disease of bone    Zoledronic acid 2002 and again in 2012.  Monitoring alkaline phosphatase levels.   Parkinson's disease ([place])    Diagnosed 2018   Sleep apnea    quit cpap in 2017    Past Surgical History:  Procedure Laterality Date   COLONOSCOPY  2015   2015 normal--no further  screening indicated   INGUINAL HERNIA REPAIR Right 07/14/2018   Procedure: OPEN RIGHT INGUINAL HERNIA REPAIR ERAS PATHWAY;  Surgeon: Fanny Skates, MD;  Location: [facility];  Service: General;  Laterality: Right;   INSERTION OF MESH Right 07/14/2018   Procedure: INSERTION OF MESH;  Surgeon: Fanny Skates, MD;  Location: [facility];  Service: General;  Laterality: Right;   KNEE SURGERY Right 2007   arthroscopic   VASECTOMY  1975    Family History  Problem Relation Age of Onset   Cancer Mother        intestinal   Early death Mother    Alcohol abuse Mother    Liver disease Father        ETOH   Alcohol abuse Father    Arthritis Father    Stroke Sister    Kidney disease Sister    Stroke Sister    Stroke Brother    Arthritis Paternal Grandmother    Early death Child     Social History   Socioeconomic History   Marital status: Married    Spouse name: Not on file   Number of children: Not on file   Years of education: Not on file   Highest education level: Not  on file  Occupational History   Occupation: retired    Comment: health care admin/hospital  Tobacco Use   Smoking status: Former    Types: Cigarettes    Quit date: 12/30/1982    Years since quitting: 39.4   Smokeless tobacco: Never  Vaping Use   Vaping Use: Never used  Substance and Sexual Activity   Alcohol use: Yes    Comment: one drink (wine or beer) daily   Drug use: No   Sexual activity: Not Currently  Other Topics Concern   Not on file  Social History Narrative   Married, several children, also grandchildren   Originally from [place].     Moved to [facility] April 2023.   Educ: College   Occup: retired Programmer, applications   No T/A/Ds   Social Determinants of Radio broadcast assistant Strain: Not on file  Food Insecurity: Not on file  Transportation Needs: Not on file  Physical Activity: Not on file  Stress: Not on file  Social Connections: Not on file   Intimate Partner Violence: Not on file    Outpatient Encounter Medications as of 06/12/2022  Medication Sig   carbidopa-levodopa (SINEMET IR) 25-100 MG tablet Take 1 tablet by mouth 3 (three) times daily. (Patient taking differently: Take 2 tablets by mouth 4 (four) times daily.)   Multiple Vitamin (MULTIVITAMIN ADULT PO) Take by mouth daily.   Psyllium (METAMUCIL PO) Take by mouth 3 (three) times a week.   tamsulosin (FLOMAX) 0.4 MG CAPS capsule Take 0.4 mg by mouth at bedtime.   [DISCONTINUED] carbidopa-levodopa (SINEMET CR) 50-200 MG tablet Take 1 tablet by mouth at bedtime.   [DISCONTINUED] CARBIDOPA-LEVODOPA PO Take 50-200 mg by mouth daily.   carbidopa-levodopa (SINEMET CR) 50-200 MG tablet Take 1 tablet by mouth at bedtime.   [DISCONTINUED] b complex vitamins tablet Take 1 tablet by mouth daily.   [DISCONTINUED] cholecalciferol (VITAMIN D) 1000 units tablet Take 1,000 Units by mouth daily.   [DISCONTINUED] co-enzyme Q-10 30 MG capsule Take 100 mg by mouth 3 (three) times daily.   [DISCONTINUED] HYDROcodone-acetaminophen (NORCO) 5-325 MG tablet Take 1-2 tablets by mouth every 6 (six) hours as needed for moderate pain or severe pain.   [DISCONTINUED] Magnesium 400 MG CAPS Take by mouth.   [DISCONTINUED] Misc Natural Products (FIBER 7 PO) Take by mouth.   [DISCONTINUED] Omega-3 Fatty Acids (FISH OIL) 1000 MG CAPS Take by mouth.   [DISCONTINUED] Probiotic Product (PROBIOTIC-10 PO) Take by mouth.   [DISCONTINUED] vitamin B-12 (CYANOCOBALAMIN) 1000 MCG tablet Take 1,000 mcg by mouth daily.   No facility-administered encounter medications on file as of 06/12/2022.    Allergies  Allergen Reactions   Other     Had rash on arm after pneumonia and flu vaccine together - never had either separately prior to this reaction   Statins     Weakness/myalgia     Review of Systems  Constitutional:  Negative for appetite change, chills, fatigue and fever.  HENT:  Negative for congestion, dental  problem, ear pain and sore throat.   Eyes:  Negative for discharge, redness and visual disturbance.  Respiratory:  Negative for cough, chest tightness, shortness of breath and wheezing.   Cardiovascular:  Negative for chest pain, palpitations and leg swelling.  Gastrointestinal:  Negative for abdominal pain, blood in stool, diarrhea, nausea and vomiting.  Genitourinary:  Negative for difficulty urinating, dysuria, flank pain, frequency, hematuria and urgency.  Musculoskeletal:  Negative for arthralgias, back pain, joint swelling, myalgias and  neck stiffness.  Skin:  Negative for pallor and rash.  Neurological:  Positive for dizziness and tremors. Negative for speech difficulty, weakness and headaches.  Endo/Heme/Allergies:  Negative for adenopathy. Does not bruise/bleed easily.  Psychiatric/Behavioral:  Negative for confusion and sleep disturbance. The patient is not nervous/anxious.    Review of Systems  Constitutional:  Negative for appetite change, chills, fatigue and fever.  HENT:  Negative for congestion, dental problem, ear pain and sore throat.   Eyes:  Negative for discharge, redness and visual disturbance.  Respiratory:  Negative for cough, chest tightness, shortness of breath and wheezing.   Cardiovascular:  Negative for chest pain, palpitations and leg swelling.  Gastrointestinal:  Negative for abdominal pain, blood in stool, diarrhea, nausea and vomiting.  Genitourinary:  Negative for difficulty urinating, dysuria, flank pain, frequency, hematuria and urgency.  Musculoskeletal:  Negative for arthralgias, back pain, joint swelling, myalgias and neck stiffness.  Skin:  Negative for pallor and rash.  Neurological:  Positive for dizziness and tremors. Negative for speech difficulty, weakness and headaches.  Hematological:  Negative for adenopathy. Does not bruise/bleed easily.  Psychiatric/Behavioral:  Negative for confusion and sleep disturbance. The patient is not nervous/anxious.     PE; Blood pressure (!) 79/56, pulse 74, temperature 97.8 F (36.6 C), height 6' (1.829 m), weight 157 lb (71.2 kg), SpO2 98 %.Body mass index is 21.29 kg/m. Gen: Alert, well appearing.    Patient is oriented to person, place, time, and situation. AFFECT: pleasant, lucid thought and speech. ENT: Ears: Right auditory canal clear, tympanic membrane normal.  Left auditory canal with 100% cerumen impaction.  Eyes: no injection, icteris, swelling, or exudate.  EOMI, PERRLA. Nose: no drainage or turbinate edema/swelling.  No injection or focal lesion.  Mouth: lips without lesion/swelling.  Oral mucosa pink and moist.  Dentition intact and without obvious caries or gingival swelling.  Oropharynx without erythema, exudate, or swelling.  Neck: supple/nontender.  No LAD, mass, or TM.  Carotid pulses 2+ bilaterally, without bruits. CV: RRR, no m/r/g.  Occasional ectopic beat. LUNGS: CTA bilat, nonlabored resps, good aeration in all lung fields. ABD: soft, NT, ND, BS normal.  No hepatospenomegaly or mass.  No bruits. EXT: no clubbing, cyanosis, or edema.   Skin - no sores or suspicious lesions or rashes or color changes NEURO: Very minimal resting tremor both arms.   Physical Exam  Gen: Alert, well appearing.  Patient is oriented to person, place, time, and situation.` AFFECT: pleasant, lucid thought and speech. ENT: Ears:Right EAC clear, normal epithelium and TM.  L EAC with 100% occlusion with cerumen. Eyes: no injection, icteris, swelling, or exudate.  EOMI, PERRLA. Nose: no drainage or turbinate edema/swelling.  No injection or focal lesion.  Mouth: lips without lesion/swelling.  Oral mucosa pink and moist.  Dentition intact and without obvious caries or gingival swelling.  Oropharynx without erythema, exudate, or swelling.  CV: RRR, occ ectopic beat, no m/r/g.   LUNGS: CTA bilat, nonlabored resps, good aeration in all lung fields. ABD: soft, NT, ND, BS normal.  No hepatospenomegaly or mass.   No bruits. EXT: no clubbing or cyanosis.  no edema.   Pertinent labs:  Last CBC Lab Results  Component Value Date   WBC 4.5 06/12/2022   HGB 13.4 06/12/2022   HCT 40.0 06/12/2022   MCV 94.4 06/12/2022   MCH 31.1 07/08/2018   RDW 14.2 06/12/2022   PLT 174.0 85/46/2703   Last metabolic panel Lab Results  Component Value Date   GLUCOSE  80 06/12/2022   NA 140 06/12/2022   K 4.3 06/12/2022   CL 104 06/12/2022   CO2 29 06/12/2022   BUN 24 (H) 06/12/2022   CREATININE 0.87 06/12/2022   GFRNONAA >60 07/08/2018   CALCIUM 9.2 06/12/2022   PROT 7.4 06/12/2022   ALBUMIN 4.2 06/12/2022   BILITOT 0.5 06/12/2022   ALKPHOS 88 06/12/2022   AST 17 06/12/2022   ALT 8 06/12/2022   ANIONGAP 5 07/08/2018    ASSESSMENT AND PLAN:   New patient, establishing care.  1.  Orthostatic hypotension.  Suspect multifactorial: Parkinsonism plus Sinemet side effect plus Flomax side effect. Instructions: Stop your tamsulosin for 3-4 days to see if your dizziness and low blood pressure improve. Monitor blood pressure and pulse 2-3 times a day so we can review in 1 wk. Start compression stockings--prescription given today. I believe he has had good response to Sinemet so I do not think discontinuation of this med is in his best interest at this time. If not improved off of Flomax then will start fludrocortisone or midodrine. CBC and c-Met today.  #2 Parkinson's disease. Continue Sinemet. Requests neurology referral to Dr. Deboraha Sprang for second opinion (current neuro is Dr. Carles Collet).  #3 BPH with lower urinary tract obstructive symptoms. See #1 above. If it turns out his Flomax is the main contributor to his orthostatic hypotension then we will do a trial of finasteride.  #4 cerumen impaction left side. Consent obtained. Procedure: Cerumen Disimpaction  Ear curette used to grasp cerumen and remove completely.  There were no complications and following the disimpaction the tympanic membrane was visible and  normal.. Tympanic membrane intact following the procedure.  Auditory canals are normal.  The patient reported relief of symptoms after removal of cerumen   #5 Preventative health care:  Vaccines: Per patient he got pneumonia vaccine 2021.  Last tetanus 2010--> prescription to pharmacy. Shingrix--> prescription to pharmacy. Colon ca screening: He had colonoscopy 2015, normal.  No further screening indicated.  An After Visit Summary was printed and given to the patient.  Return in about 1 week (around 06/19/2022) for f/u orthostatic hypotension.  Signed:  Crissie Sickles, MD           06/12/2022

## 2022-06-13 ENCOUNTER — Ambulatory Visit: Payer: Medicare Other

## 2022-06-13 ENCOUNTER — Other Ambulatory Visit: Payer: Self-pay

## 2022-06-13 MED ORDER — CARBIDOPA-LEVODOPA 25-100 MG PO TABS: 2.0000 | ORAL_TABLET | Freq: Four times a day (QID) | ORAL | Status: DC

## 2022-06-13 MED ORDER — ZOSTER VAC RECOMB ADJUVANTED 50 MCG/0.5ML IM SUSR: 0.5000 mL | Freq: Once | INTRAMUSCULAR | 0 refills | Status: AC

## 2022-06-13 NOTE — Progress Notes (Deleted)
Per orders of ***, pt is here for ***. pt received *** in *** at ***. Given by [place] L. CMA/CPT. Pt tolerated *** well.

## 2022-06-13 NOTE — Progress Notes (Signed)
Provider asked that a shingles prescription be sent to the pharmacy for patient.

## 2022-06-17 ENCOUNTER — Telehealth: Payer: Self-pay

## 2022-06-17 NOTE — Telephone Encounter (Signed)
Please review and advise.

## 2022-06-17 NOTE — Telephone Encounter (Signed)
Patient was told to stop taking Tamsulosin.  He has app with Dr. Milinda Cave on 6/23.  Does provider want him to continue to NOT take it until his appt or does he need to start taking again?  Please advise. [phone]

## 2022-06-18 NOTE — Telephone Encounter (Signed)
Stay off tamsulosin as long as he feels that his bladder is emptying okay.  If he feels like he is retaining urine in his bladder too much then restart tamsulosin.

## 2022-06-18 NOTE — Telephone Encounter (Signed)
Spoke with pt regarding recommendations. Pt voiced understanding

## 2022-06-21 ENCOUNTER — Telehealth: Payer: Self-pay

## 2022-06-21 ENCOUNTER — Ambulatory Visit (INDEPENDENT_AMBULATORY_CARE_PROVIDER_SITE_OTHER): Payer: Medicare Other | Admitting: Family Medicine

## 2022-06-21 ENCOUNTER — Encounter: Payer: Self-pay | Admitting: Family Medicine

## 2022-06-21 VITALS — BP 89/54 | HR 63 | Temp 97.8°F | Ht 72.0 in | Wt 158.2 lb

## 2022-06-21 DIAGNOSIS — I499 Cardiac arrhythmia, unspecified: Secondary | ICD-10-CM | POA: Diagnosis not present

## 2022-06-21 DIAGNOSIS — I959 Hypotension, unspecified: Secondary | ICD-10-CM | POA: Diagnosis not present

## 2022-06-21 DIAGNOSIS — N401 Enlarged prostate with lower urinary tract symptoms: Secondary | ICD-10-CM

## 2022-06-21 DIAGNOSIS — J3800 Paralysis of vocal cords and larynx, unspecified: Secondary | ICD-10-CM | POA: Diagnosis not present

## 2022-06-21 DIAGNOSIS — N138 Other obstructive and reflux uropathy: Secondary | ICD-10-CM

## 2022-06-21 DIAGNOSIS — I951 Orthostatic hypotension: Secondary | ICD-10-CM

## 2022-06-21 MED ORDER — TETANUS-DIPHTH-ACELL PERTUSSIS 5-2-15.5 LF-MCG/0.5 IM SUSP: 0.5000 mL | Freq: Once | INTRAMUSCULAR | 0 refills | Status: AC

## 2022-06-21 NOTE — Telephone Encounter (Signed)
[  10:41 AM] McGowen, Durelle Holms [facility] neurology referral last visit was waiting on my note to be in emr before they schedule him.  Pls notify their office that my note is in from his initial visit last week.

## 2022-06-24 NOTE — Telephone Encounter (Signed)
Noted  

## 2022-08-16 ENCOUNTER — Other Ambulatory Visit: Payer: Self-pay | Admitting: Family Medicine

## 2022-08-29 ENCOUNTER — Telehealth: Payer: Self-pay | Admitting: Family Medicine

## 2022-08-29 MED ORDER — ZOSTER VAC RECOMB ADJUVANTED 50 MCG/0.5ML IM SUSR: 0.5000 mL | Freq: Once | INTRAMUSCULAR | 0 refills | Status: AC

## 2022-08-29 NOTE — Telephone Encounter (Signed)
Pt needs script for Shingrix to be done at pharmacy. He is unsure which pharmacy he needs to go to for this.

## 2022-08-29 NOTE — Telephone Encounter (Signed)
Spoke with patient and advised new rx sent to local pharmacy, he voiced understanding.

## 2022-09-04 DIAGNOSIS — H903 Sensorineural hearing loss, bilateral: Secondary | ICD-10-CM | POA: Diagnosis not present

## 2022-09-04 DIAGNOSIS — R490 Dysphonia: Secondary | ICD-10-CM | POA: Diagnosis not present

## 2022-09-04 DIAGNOSIS — G20 Parkinson's disease: Secondary | ICD-10-CM | POA: Insufficient documentation

## 2022-09-24 ENCOUNTER — Telehealth: Payer: Self-pay | Admitting: Family Medicine

## 2022-09-24 MED ORDER — CARBIDOPA-LEVODOPA 25-100 MG PO TABS: 2.0000 | ORAL_TABLET | Freq: Four times a day (QID) | ORAL | 0 refills | Status: DC

## 2022-09-24 NOTE — Telephone Encounter (Signed)
Pt contacted his mail order pharmacy for a 75 day supply of his Carbidopa, however he is requesting a 5 day supply be sent to his local [facility] on [address] in [place] until that arrives in the mail.

## 2022-09-24 NOTE — Telephone Encounter (Signed)
Patient confirmed he only needed a 5 day supply of the 25-100mg  dose. Rx sent to [facility].

## 2022-09-26 ENCOUNTER — Ambulatory Visit (INDEPENDENT_AMBULATORY_CARE_PROVIDER_SITE_OTHER): Payer: Medicare Other | Admitting: Family Medicine

## 2022-09-26 ENCOUNTER — Encounter: Payer: Self-pay | Admitting: Family Medicine

## 2022-09-26 VITALS — BP 89/54 | HR 85 | Temp 97.5°F | Ht 72.0 in | Wt 154.8 lb

## 2022-09-26 DIAGNOSIS — N138 Other obstructive and reflux uropathy: Secondary | ICD-10-CM | POA: Diagnosis not present

## 2022-09-26 DIAGNOSIS — I950 Idiopathic hypotension: Secondary | ICD-10-CM

## 2022-09-26 DIAGNOSIS — G20 Parkinson's disease: Secondary | ICD-10-CM

## 2022-09-26 DIAGNOSIS — R490 Dysphonia: Secondary | ICD-10-CM

## 2022-09-26 DIAGNOSIS — I951 Orthostatic hypotension: Secondary | ICD-10-CM | POA: Diagnosis not present

## 2022-09-26 DIAGNOSIS — Z23 Encounter for immunization: Secondary | ICD-10-CM

## 2022-09-26 DIAGNOSIS — N401 Enlarged prostate with lower urinary tract symptoms: Secondary | ICD-10-CM

## 2022-09-26 NOTE — Progress Notes (Signed)
OFFICE VISIT  09/26/2022  CC:  Chief Complaint  Patient presents with   Follow-up    Hypotension and dizziness; he has noticed a little in the last 2 weeks but prior it was almost non-existent.    Patient is a 81 y.o. male who presents for 28-month follow-up orthostatic dizziness/hypotension. A/P as of last visit: "#1 orthostatic hypotension.  Suspect multifactorial: Parkinsonism plus Sinemet side effect plus Flomax side effect. BP's improved since DC of Flomax, pt is now asymptomatic.   #2 PACs. He had some irregularity of rhythm on auscultation today so I did want to rule out arrhythmia, especially in light of #1 above. EKG with some PACs but otherwise normal.   #3 BPH with nocturia.  Continue off flomax.  No new med for bph at this time. If his nocturia returns to previous degree then we will discuss trial of finasteride.   #4 Parkinson's disease. Currently in the process of referring to a new neurologist for second opinion--- per patient request.   #5 Hx chronic hoarseness d/t vocal cord paresis, dx approx 2 yrs ago, ENT discussed vocal cord injection procedure at that time but pt held off.  He requests referral to ENT locally to revisit this issue."  INTERIM HX: Mario George feels like he is doing very well. Has had only a couple of mild and brief periods of lightheadedness upon standing. He has not been checking blood pressure at home lately. Hydrating moderately well.  He has tried the compression stockings some. No palpitations, chest pain, shortness of breath, or lower extremity swelling.  He states he gets up about 3 times a night to urinate, has some mild urinary urgency during the day.  He feels like he can live with this fine.  Past Medical History:  Diagnosis Date   BPH with obstruction/lower urinary tract symptoms    Eczema    Legs.  Triamcinolone   GERD (gastroesophageal reflux disease)    Osteoarthritis, multiple sites    Paget's disease of bone    Zoledronic acid  2002 and again in 2012.  Monitoring alkaline phosphatase levels.   Parkinson's disease ([place])    Diagnosed 2018   Sleep apnea    quit cpap in 2017    Past Surgical History:  Procedure Laterality Date   COLONOSCOPY  2015   2015 normal--no further screening indicated   INGUINAL HERNIA REPAIR Right 07/14/2018   Procedure: OPEN RIGHT INGUINAL HERNIA REPAIR ERAS PATHWAY;  Surgeon: Fanny Skates, MD;  Location: [facility];  Service: General;  Laterality: Right;   INSERTION OF MESH Right 07/14/2018   Procedure: INSERTION OF MESH;  Surgeon: Fanny Skates, MD;  Location: [facility];  Service: General;  Laterality: Right;   KNEE SURGERY Right 2007   arthroscopic   VASECTOMY  1975    Outpatient Medications Prior to Visit  Medication Sig Dispense Refill   carbidopa-levodopa (SINEMET CR) 50-200 MG tablet TAKE 1 TABLET BY MOUTH AT  BEDTIME 90 tablet 0   carbidopa-levodopa (SINEMET IR) 25-100 MG tablet Take 2 tablets by mouth 4 (four) times daily. 40 tablet 0   Multiple Vitamin (MULTIVITAMIN ADULT PO) Take by mouth daily.     Psyllium (METAMUCIL PO) Take by mouth daily.     ciprofloxacin-dexamethasone (CIPRODEX) OTIC suspension 4 drops to right ear twice daily (Patient not taking: Reported on 09/26/2022)     No facility-administered medications prior to visit.    Allergies  Allergen Reactions   Other     Had  rash on arm after pneumonia and flu vaccine together - never had either separately prior to this reaction   Statins     Weakness/myalgia     ROS As per HPI  PE:    09/26/2022   10:48 AM 06/21/2022    9:55 AM 06/12/2022    8:58 AM  Vitals with BMI  Height 6\' 0"  6\' 0"  6\' 0"   Weight 154 lbs 13 oz 158 lbs 3 oz 157 lbs  BMI 20.99 21.45 21.29  Systolic 89 89 79  Diastolic 54 54 56  Pulse 85 63 74   Physical Exam  Gen: Alert, well appearing.  Patient is oriented to person, place, time, and situation. AFFECT: pleasant, lucid thought and  speech. CV: RRR, only the occasional ectopic beat, no m/r/g.   LUNGS: CTA bilat, nonlabored resps, good aeration in all lung fields. EXT: no clubbing or cyanosis.  no edema.   LABS:  Last CBC Lab Results  Component Value Date   WBC 4.5 06/12/2022   HGB 13.4 06/12/2022   HCT 40.0 06/12/2022   MCV 94.4 06/12/2022   MCH 31.1 07/08/2018   RDW 14.2 06/12/2022   PLT 174.0 06/12/2022   Last metabolic panel Lab Results  Component Value Date   GLUCOSE 80 06/12/2022   NA 140 06/12/2022   K 4.3 06/12/2022   CL 104 06/12/2022   CO2 29 06/12/2022   BUN 24 (H) 06/12/2022   CREATININE 0.87 06/12/2022   GFRNONAA >60 07/08/2018   CALCIUM 9.2 06/12/2022   PROT 7.4 06/12/2022   ALBUMIN 4.2 06/12/2022   BILITOT 0.5 06/12/2022   ALKPHOS 88 06/12/2022   AST 17 06/12/2022   ALT 8 06/12/2022   ANIONGAP 5 07/08/2018     IMPRESSION AND PLAN:  1) Idiopathic hypotension, orthostatic hypotension. Essentially asymptomatic lately.  He will continue with attempting moderate increase in sodium in diet, wear lower extremity compression stockings, and drink at least 65 ounces of clear fluids a day.   2) BPH with mild obstructive symptoms, mostly nocturia.  He was on Flomax at 1 point but this was likely exacerbating #1 above. He declines any further treatment for this.  3) Parkinson's disease:  Patient is getting established with a neurologist --Dr. 06/14/2022 with [facility]--early next year. He continues on carbidopa-levodopa 25-100, 2 tabs 4 times daily.  Additionally he takes a 50-200 mg tab at bedtime.  4) chronic hoarseness, history of vocal cord paresis and/or polyp. He had an initial ENT consult with [facility] on 09/04/2022. I reviewed their note today.  Flexible nasolaryngoscopy showed everything normal, including normal vocal cord movement and no polyps.  He did have some atrophy of the vocal cords. He does have voice exercises that he admits he does not do very much. ENT did offer a referral  to a vocal cord specialist but he declined at this time.  An After Visit Summary was printed and given to the patient.  FOLLOW UP: Return in about 6 months (around 03/27/2023) for annual CPE (fasting).  Signed:  [facility], MD           09/26/2022

## 2022-10-11 ENCOUNTER — Other Ambulatory Visit: Payer: Self-pay

## 2022-10-11 MED ORDER — CARBIDOPA-LEVODOPA 25-100 MG PO TABS: 2.0000 | ORAL_TABLET | Freq: Four times a day (QID) | ORAL | 0 refills | Status: AC

## 2022-10-21 ENCOUNTER — Telehealth: Payer: Self-pay

## 2022-10-21 NOTE — Telephone Encounter (Signed)
Pt has scheduled appt  [facility] - Client TELEPHONE ADVICE RECORD [facility] Patient Name: Mario George Gender: Male DOB: 09-Mar-1941 Age: 81 Y 44 M 15 D Return Phone Number: [phone] (Primary) Address: City/ State/ Zip: [place]  [address] Client [facility] - Client Client Site [facility] - Day Provider Crissie Sickles - MD Contact Type Call Who Is Calling Patient / Member / Family / Caregiver Call Type Triage / Clinical Relationship To Patient Self Return Phone Number [phone] (Primary) Chief Complaint Leg Pain Reason for Call Symptomatic / Request for [place] states what surgeon would Dr Anitra Lauth Recommend for possible knee replacement surgery? having knee pain; has arthritis; Translation No Nurse Assessment Nurse: Cherre Robins, RN, Ria Comment Date/Time (Eastern Time): 10/21/2022 12:37:22 PM Confirm and document reason for call. If symptomatic, describe symptoms. ---Caller states he is having knee pain. Had xrays six months ago that showed arthritis in both knees. No fever. Does the patient have any new or worsening symptoms? ---Yes Will a triage be completed? ---Yes Related visit to physician within the last 2 weeks? ---No Does the PT have any chronic conditions? (i.e. diabetes, asthma, this includes High risk factors for pregnancy, etc.) ---Yes List chronic conditions. ---parkinson's, pagets disease Is this a behavioral health or substance abuse call? ---No Guidelines Guideline Title Affirmed Question Affirmed Notes Nurse Date/Time Eilene Ghazi Time) Knee Pain [1] MODERATE pain (e.g., interferes with normal activities, limping) AND [2] present > 3 days Weiss-Hilton, Levonne Spiller 10/21/2022 12:39:45 PM Disp. Time Eilene Ghazi Time) Disposition Final User 10/21/2022 12:44:10 PM SEE PCP WITHIN 3 DAYS Yes Weiss-Hilton, RN, Ria Comment PLEASE NOTE: All timestamps  contained within this report are represented as [place] Standard Time. CONFIDENTIALTY NOTICE: This fax transmission is intended only for the addressee. It contains information that is legally privileged, confidential or otherwise protected from use or disclosure. If you are not the intended recipient, you are strictly prohibited from reviewing, disclosing, copying using or disseminating any of this information or taking any action in reliance on or regarding this information. If you have received this fax in error, please notify us immediately by telephone so that we can arrange for its return to [place]. Phone: [phone], Toll-Free: [phone], Fax: [phone] Page: 2 of 2 Call Id: [id number] Final Disposition 10/21/2022 12:44:10 PM SEE PCP WITHIN 3 DAYS Yes Weiss-Hilton, RN, Ivonne Andrew Disagree/Comply Comply Caller Understands Yes PreDisposition InappropriateToAsk Care Advice Given Per Guideline SEE PCP WITHIN 3 DAYS: * You need to be seen within 2 or 3 days. REST YOUR KNEE FOR THE NEXT COUPLE DAYS: * Avoid activities that worsen your pain. LOCAL HEAT: * Apply a warm washcloth or heating pad for 10 minutes three times daily. * This will help to increase circulation and improve healing. CALL BACK IF: * Fever or severe knee pain occurs * Redness or severe swelling occurs * You become worse CARE ADVICE given per Knee Pain (Adult) guideline. Comments User: Carolan Clines, RN Date/Time Eilene Ghazi Time): 10/21/2022 12:41:00 PM states knee pain when walking 7/10, current while still it's 1-2/10

## 2022-10-25 ENCOUNTER — Encounter: Payer: Self-pay | Admitting: Family Medicine

## 2022-10-25 ENCOUNTER — Ambulatory Visit (INDEPENDENT_AMBULATORY_CARE_PROVIDER_SITE_OTHER): Payer: Medicare Other | Admitting: Family Medicine

## 2022-10-25 VITALS — BP 115/75 | HR 69 | Temp 98.0°F | Ht 72.0 in | Wt 152.8 lb

## 2022-10-25 DIAGNOSIS — M170 Bilateral primary osteoarthritis of knee: Secondary | ICD-10-CM | POA: Diagnosis not present

## 2022-10-25 NOTE — Progress Notes (Signed)
OFFICE VISIT  10/25/2022  CC:  Chief Complaint  Patient presents with   Bilateral knee pain    Got worse in the last 4 months. Had XR done in Feb    Patient is a 81 y.o. male who presents for bilat knee pain.  HPI: Several years history of gradually progressive bilateral knee pain. Intermittent swelling of left knee but none recently.  No redness.  No pain at rest but with just 5 to 10 minutes of walking around they bother him quite a bit. 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  He does walk with a cane.  He does not feel like his knees are giving way or locking. He just moved to [place] 6 months ago.  Says he saw an orthopedist prior to moving here and no steps were taken for treatment per his recollection.  He does bring in photocopies of plain films of the knees done on 02/05/2022.  There is marked medial joint space narrowing and patellofemoral arthritis changes as well.  Left greater than right valgus deformity. He occasionally takes Tylenol for his pain and this helps some.  He has never had knee injections.  He has a remote history of right knee arthroscopic surgery.   Past Medical History:  Diagnosis Date   BPH with obstruction/lower urinary tract symptoms    orthostatic hypotension on flomax   Chronic hoarseness    Eczema    Legs.  Triamcinolone   GERD (gastroesophageal reflux disease)    OSA (obstructive sleep apnea)    quit cpap in 2017   Osteoarthritis, multiple sites    Paget's disease of bone    Zoledronic acid 2002 and again in 2012.  Monitoring alkaline phosphatase levels.   Parkinson's disease    Diagnosed 2018   Past Surgical History:  Procedure Laterality Date   COLONOSCOPY  2015   2015 normal--no further screening indicated   INGUINAL HERNIA REPAIR Right 07/14/2018   Procedure: OPEN RIGHT INGUINAL HERNIA REPAIR ERAS PATHWAY;  Surgeon: Fanny Skates, MD;  Location: [facility];  Service: General;  Laterality:  Right;   INSERTION OF MESH Right 07/14/2018   Procedure: INSERTION OF MESH;  Surgeon: Fanny Skates, MD;  Location: [facility];  Service: General;  Laterality: Right;   KNEE SURGERY Right 2007   arthroscopic   VASECTOMY  1975    Outpatient Medications Prior to Visit  Medication Sig Dispense Refill   carbidopa-levodopa (SINEMET CR) 50-200 MG tablet TAKE 1 TABLET BY MOUTH AT  BEDTIME 90 tablet 0   carbidopa-levodopa (SINEMET IR) 25-100 MG tablet Take 2 tablets by mouth 4 (four) times daily. 720 tablet 0   Multiple Vitamin (MULTIVITAMIN ADULT PO) Take by mouth daily.     Psyllium (METAMUCIL PO) Take by mouth daily.     No facility-administered medications prior to visit.    Allergies  Allergen Reactions   Other     Had rash on arm after pneumonia and flu vaccine together - never had either separately prior to this reaction   Statins     Weakness/myalgia     ROS As per HPI  PE:    10/25/2022    2:49 PM 09/26/2022   10:48 AM 06/21/2022    9:55 AM  Vitals with BMI  Height 6\' 0"  6\' 0"  6\' 0"   Weight 152 lbs 13 oz 154 lbs 13 oz 158 lbs 3 oz  BMI 20.72 38.18 29.93  Systolic 716 89 89  Diastolic 75  54 54  Pulse 69 85 63     Physical Exam  Gen: Alert, well appearing.  Patient is oriented to person, place, time, and situation. Gait: Short steps, does not fully extend the knees, balance okay as long as he goes slow. Bilateral bony hypertrophy of the knees, bilateral varus deformity.  No effusion, no instability.  Active and passive range of motion is fully intact with mild stiffness but no pain.  No crepitus.   LABS:  Last metabolic panel Lab Results  Component Value Date   GLUCOSE 80 06/12/2022   NA 140 06/12/2022   K 4.3 06/12/2022   CL 104 06/12/2022   CO2 29 06/12/2022   BUN 24 (H) 06/12/2022   CREATININE 0.87 06/12/2022   GFRNONAA >60 07/08/2018   CALCIUM 9.2 06/12/2022   PROT 7.4 06/12/2022   ALBUMIN 4.2 06/12/2022   BILITOT 0.5 06/12/2022    ALKPHOS 88 06/12/2022   AST 17 06/12/2022   ALT 8 06/12/2022   ANIONGAP 5 07/08/2018   IMPRESSION AND PLAN:  Bilateral knee osteoarthritis. We will have orthopedist see him to discuss next steps for evaluation and management. He will continue Tylenol as needed.  An After Visit Summary was printed and given to the patient.  FOLLOW UP: Return for as needed.  Signed:  Santiago Bumpers, MD           10/25/2022

## 2022-10-30 ENCOUNTER — Ambulatory Visit: Payer: Medicare Other

## 2022-11-04 ENCOUNTER — Other Ambulatory Visit: Payer: Self-pay | Admitting: Family Medicine

## 2022-11-06 ENCOUNTER — Ambulatory Visit: Payer: Medicare Other

## 2022-11-06 ENCOUNTER — Ambulatory Visit (INDEPENDENT_AMBULATORY_CARE_PROVIDER_SITE_OTHER): Payer: Medicare Other

## 2022-11-06 VITALS — Wt 152.0 lb

## 2022-11-06 DIAGNOSIS — Z0000 Encounter for general adult medical examination without abnormal findings: Secondary | ICD-10-CM

## 2022-11-06 NOTE — Progress Notes (Signed)
I connected with  Mario George on 11/06/22 by a audio enabled telemedicine application and verified that I am speaking with the correct person using two identifiers.  Patient Location: Home  Provider Location: Home Office  I discussed the limitations of evaluation and management by telemedicine. The patient expressed understanding and agreed to proceed.   Subjective:   Mario George is a 81 y.o. male who presents for an Initial Medicare Annual Wellness Visit.  Review of Systems     Cardiac Risk Factors include: advanced age (>20men, >71 women);male gender     Objective:    Today's Vitals   11/06/22 1002  Weight: 152 lb (68.9 kg)   Body mass index is 20.61 kg/m.     11/06/2022   10:08 AM 07/14/2018    9:07 AM 07/08/2018   11:56 AM  Advanced Directives  Does Patient Have a Medical Advance Directive? Yes Yes Yes  Type of Estate agent of [place];Living will Living will Living will  Does patient want to make changes to medical advance directive?   No - Patient declined  Copy of Healthcare Power of Attorney in Chart? No - copy requested No - copy requested     Current Medications (verified) Outpatient Encounter Medications as of 11/06/2022  Medication Sig   carbidopa-levodopa (SINEMET CR) 50-200 MG tablet TAKE 1 TABLET BY MOUTH AT  BEDTIME   carbidopa-levodopa (SINEMET IR) 25-100 MG tablet Take 2 tablets by mouth 4 (four) times daily.   Multiple Vitamin (MULTIVITAMIN ADULT PO) Take by mouth daily.   Psyllium (METAMUCIL PO) Take by mouth daily.   No facility-administered encounter medications on file as of 11/06/2022.    Allergies (verified) Other and Statins   History: Past Medical History:  Diagnosis Date   BPH with obstruction/lower urinary tract symptoms    orthostatic hypotension on flomax   Chronic hoarseness    Eczema    Legs.  Triamcinolone   GERD (gastroesophageal reflux disease)    OSA (obstructive sleep apnea)    quit cpap in 2017    Osteoarthritis, multiple sites    Paget's disease of bone    Zoledronic acid 2002 and again in 2012.  Monitoring alkaline phosphatase levels.   Parkinson's disease    Diagnosed 2018   Past Surgical History:  Procedure Laterality Date   COLONOSCOPY  2015   2015 normal--no further screening indicated   INGUINAL HERNIA REPAIR Right 07/14/2018   Procedure: OPEN RIGHT INGUINAL HERNIA REPAIR ERAS PATHWAY;  Surgeon: Claud Kelp, MD;  Location: [facility];  Service: General;  Laterality: Right;   INSERTION OF MESH Right 07/14/2018   Procedure: INSERTION OF MESH;  Surgeon: Claud Kelp, MD;  Location: [facility];  Service: General;  Laterality: Right;   KNEE SURGERY Right 2007   arthroscopic   VASECTOMY  1975   Family History  Problem Relation Age of Onset   Cancer Mother        intestinal   Early death Mother    Alcohol abuse Mother    Liver disease Father        ETOH   Alcohol abuse Father    Arthritis Father    Stroke Sister    Kidney disease Sister    Stroke Sister    Stroke Brother    Arthritis Paternal Grandmother    Early death Child    Social History   Socioeconomic History   Marital status: Married    Spouse name: Not on file  Number of children: Not on file   Years of education: Not on file   Highest education level: Not on file  Occupational History   Occupation: retired    Comment: health care admin/hospital  Tobacco Use   Smoking status: Former    Types: Cigarettes    Quit date: 12/30/1982    Years since quitting: 39.8   Smokeless tobacco: Never  Vaping Use   Vaping Use: Never used  Substance and Sexual Activity   Alcohol use: Yes    Comment: one drink (wine or beer) daily   Drug use: No   Sexual activity: Not Currently  Other Topics Concern   Not on file  Social History Narrative   Married, several children, also grandchildren   Originally from [place].     Moved to [facility] April 2023.   Educ: College    Occup: retired Presenter, broadcasting   No T/A/Ds   Social Determinants of Corporate investment banker Strain: Low Risk  (11/06/2022)   Overall Financial Resource Strain (CARDIA)    Difficulty of Paying Living Expenses: Not hard at all  Food Insecurity: No Food Insecurity (11/06/2022)   Hunger Vital Sign    Worried About Running Out of Food in the Last Year: Never true    Ran Out of Food in the Last Year: Never true  Transportation Needs: No Transportation Needs (11/06/2022)   PRAPARE - Administrator, Civil Service (Medical): No    Lack of Transportation (Non-Medical): No  Physical Activity: Sufficiently Active (11/06/2022)   Exercise Vital Sign    Days of Exercise per Week: 5 days    Minutes of Exercise per Session: 30 min  Stress: No Stress Concern Present (11/06/2022)   [employer] of Occupational Health - Occupational Stress Questionnaire    Feeling of Stress : Not at all  Social Connections: Moderately Isolated (11/06/2022)   Social Connection and Isolation Panel [NHANES]    Frequency of Communication with Friends and Family: More than three times a week    Frequency of Social Gatherings with Friends and Family: Once a week    Attends Religious Services: Never    Database administrator or Organizations: No    Attends Engineer, structural: Never    Marital Status: Married    Tobacco Counseling Counseling given: Not Answered   Clinical Intake:  Pre-visit preparation completed: Yes  Pain : No/denies pain     BMI - recorded: 20.61 Nutritional Status: BMI of 19-24  Normal Nutritional Risks: None Diabetes: No  How often do you need to have someone help you when you read instructions, pamphlets, or other written materials from your doctor or pharmacy?: 1 - Never  Diabetic?no  Interpreter Needed?: No  Information entered by :: Lanier Ensign, LPN   Activities of Daily Living    11/06/2022   10:10 AM  In your present state of health, do you have  any difficulty performing the following activities:  Hearing? 1  Comment slight hearing loss  Vision? 0  Difficulty concentrating or making decisions? 0  Walking or climbing stairs? 0  Dressing or bathing? 0  Doing errands, shopping? 0  Preparing Food and eating ? N  Using the Toilet? N  In the past six months, have you accidently leaked urine? N  Do you have problems with loss of bowel control? N  Managing your Medications? N  Managing your Finances? N  Housekeeping or managing your Housekeeping? N    Patient  Care Team: Jeoffrey Massed, MD as PCP - General (Family Medicine) Jaquita Folds, MD as Referring Physician (Neurology) Tat, Octaviano Batty, DO as Consulting Physician (Neurology) Spainhour, Kermit Balo, PA as Consulting Physician (Otolaryngology)  Indicate any recent Medical Services you may have received from other than [facility] providers in the past year (date may be approximate).     Assessment:   This is a routine wellness examination for Ruddy.  Hearing/Vision screen Hearing Screening - Comments:: Pt slight loss of hearing  Vision Screening - Comments:: Pt follows up with Dr Cathey Endow for annual eye exams   Dietary issues and exercise activities discussed: Current Exercise Habits: Home exercise routine, Type of exercise: stretching;walking;Other - see comments, Time (Minutes): 30, Frequency (Times/Week): 5, Weekly Exercise (Minutes/Week): 150   Goals Addressed             This Visit's Progress    Patient Stated       Stay healthy and active        Depression Screen    11/06/2022   10:06 AM 06/12/2022    9:01 AM  PHQ 2/9 Scores  PHQ - 2 Score 0 0    Fall Risk    11/06/2022   10:09 AM 06/12/2022    8:58 AM 02/09/2019    8:13 AM 08/24/2018    3:33 PM 02/03/2018   10:46 AM  Fall Risk   Falls in the past year? 0 1 0 No No  Number falls in past yr: 0 0 0    Injury with Fall? 0 0 0    Risk for fall due to : Impaired balance/gait;Impaired mobility Impaired  balance/gait;Impaired vision     Follow up Falls prevention discussed Falls evaluation completed Falls evaluation completed      FALL RISK PREVENTION PERTAINING TO THE HOME:  Any stairs in or around the home? No  If so, are there any without handrails? No  Home free of loose throw rugs in walkways, pet beds, electrical cords, etc? Yes  Adequate lighting in your home to reduce risk of falls? Yes   ASSISTIVE DEVICES UTILIZED TO PREVENT FALLS:  Life alert? No  Use of a cane, walker or w/c? Yes  Grab bars in the bathroom? Yes  Shower chair or bench in shower? Yes  Elevated toilet seat or a handicapped toilet? No   TIMED UP AND GO:  Was the test performed? No .   Cognitive Function:        11/06/2022   10:10 AM  6CIT Screen  What Year? 0 points  What month? 0 points  What time? 0 points  Count back from 20 0 points  Months in reverse 0 points  Repeat phrase 0 points  Total Score 0 points    Immunizations Immunization History  Administered Date(s) Administered   Fluad Quad(high Dose 65+) 09/26/2022   PFIZER(Purple Top)SARS-COV-2 Vaccination 02/09/2020, 03/02/2020, 10/13/2020, 04/08/2021   Pfizer Covid-19 Vaccine Bivalent Booster 19yrs & up 10/14/2021   Pneumococcal-Unspecified 12/31/2019   Tdap 12/30/2008   Zoster Recombinat (Shingrix) 08/29/2022    TDAP status: Due, Education has been provided regarding the importance of this vaccine. Advised may receive this vaccine at local pharmacy or Health Dept. Aware to provide a copy of the vaccination record if obtained from local pharmacy or Health Dept. Verbalized acceptance and understanding.  Flu Vaccine status: Up to date  Pneumococcal vaccine status: Due, Education has been provided regarding the importance of this vaccine. Advised may receive this vaccine at local pharmacy  or Health Dept. Aware to provide a copy of the vaccination record if obtained from local pharmacy or Health Dept. Verbalized acceptance and  understanding.  Covid-19 vaccine status: Completed vaccines  Qualifies for Shingles Vaccine? Yes   Zostavax completed Yes   Shingrix Completed?: No.    Education has been provided regarding the importance of this vaccine. Patient has been advised to call insurance company to determine out of pocket expense if they have not yet received this vaccine. Advised may also receive vaccine at local pharmacy or Health Dept. Verbalized acceptance and understanding. Pt received 1st dose  08/29/22  Screening Tests Health Maintenance  Topic Date Due   Pneumonia Vaccine 64+ Years old (1 - PCV) 01/06/2006   TETANUS/TDAP  12/30/2018   COVID-19 Vaccine (6 - Pfizer series) 02/14/2022   Zoster Vaccines- Shingrix (2 of 2) 10/24/2022   Medicare Annual Wellness (AWV)  11/07/2023   INFLUENZA VACCINE  Completed   HPV VACCINES  Aged Out    Health Maintenance  Health Maintenance Due  Topic Date Due   Pneumonia Vaccine 66+ Years old (1 - PCV) 01/06/2006   TETANUS/TDAP  12/30/2018   COVID-19 Vaccine (6 - Pfizer series) 02/14/2022   Zoster Vaccines- Shingrix (2 of 2) 10/24/2022    Colorectal cancer screening: No longer required.    Additional Screening:  Vision Screening: Recommended annual ophthalmology exams for early detection of glaucoma and other disorders of the eye. Is the patient up to date with their annual eye exam?  Yes  Who is the provider or what is the name of the office in which the patient attends annual eye exams? Dr Cathey Endow  If pt is not established with a provider, would they like to be referred to a provider to establish care? No .   Dental Screening: Recommended annual dental exams for proper oral hygiene  Community Resource Referral / Chronic Care Management: CRR required this visit?  No   CCM required this visit?  No      Plan:     I have personally reviewed and noted the following in the patient's chart:   Medical and social history Use of alcohol, tobacco or illicit  drugs  Current medications and supplements including opioid prescriptions. Patient is not currently taking opioid prescriptions. Functional ability and status Nutritional status Physical activity Advanced directives List of other physicians Hospitalizations, surgeries, and ER visits in previous 12 months Vitals Screenings to include cognitive, depression, and falls Referrals and appointments  In addition, I have reviewed and discussed with patient certain preventive protocols, quality metrics, and best practice recommendations. A written personalized care plan for preventive services as well as general preventive health recommendations were provided to patient.     Marzella Schlein, LPN   [id number]   Nurse Notes: none at time

## 2022-11-06 NOTE — Patient Instructions (Signed)
Mr. Mario George , Thank you for taking time to come for your Medicare Wellness Visit. I appreciate your ongoing commitment to your health goals. Please review the following plan we discussed and let me know if I can assist you in the future.   These are the goals we discussed:  Goals      Patient Stated     Stay healthy and active         This is a list of the screening recommended for you and due dates:  Health Maintenance  Topic Date Due   Pneumonia Vaccine (1 - PCV) 01/06/2006   Tetanus Vaccine  12/30/2018   COVID-19 Vaccine (6 - Pfizer series) 02/14/2022   Zoster (Shingles) Vaccine (2 of 2) 10/24/2022   Medicare Annual Wellness Visit  11/07/2023   Flu Shot  Completed   HPV Vaccine  Aged Out    Advanced directives: Please bring a copy of your health care power of attorney and living will to the office at your convenience.  Conditions/risks identified: stay active and healthy   Next appointment: Follow up in one year for your annual wellness visit.   Preventive Care 48 Years and Older, Male  Preventive care refers to lifestyle choices and visits with your health care provider that can promote health and wellness. What does preventive care include? A yearly physical exam. This is also called an annual well check. Dental exams once or twice a year. Routine eye exams. Ask your health care provider how often you should have your eyes checked. Personal lifestyle choices, including: Daily care of your teeth and gums. Regular physical activity. Eating a healthy diet. Avoiding tobacco and drug use. Limiting alcohol use. Practicing safe sex. Taking low doses of aspirin every day. Taking vitamin and mineral supplements as recommended by your health care provider. What happens during an annual well check? The services and screenings done by your health care provider during your annual well check will depend on your age, overall health, lifestyle risk factors, and family history of  disease. Counseling  Your health care provider may ask you questions about your: Alcohol use. Tobacco use. Drug use. Emotional well-being. Home and relationship well-being. Sexual activity. Eating habits. History of falls. Memory and ability to understand (cognition). Work and work Astronomer. Screening  You may have the following tests or measurements: Height, weight, and BMI. Blood pressure. Lipid and cholesterol levels. These may be checked every 5 years, or more frequently if you are over 31 years old. Skin check. Lung cancer screening. You may have this screening every year starting at age 76 if you have a 30-pack-year history of smoking and currently smoke or have quit within the past 15 years. Fecal occult blood test (FOBT) of the stool. You may have this test every year starting at age 46. Flexible sigmoidoscopy or colonoscopy. You may have a sigmoidoscopy every 5 years or a colonoscopy every 10 years starting at age 82. Prostate cancer screening. Recommendations will vary depending on your family history and other risks. Hepatitis C blood test. Hepatitis B blood test. Sexually transmitted disease (STD) testing. Diabetes screening. This is done by checking your blood sugar (glucose) after you have not eaten for a while (fasting). You may have this done every 1-3 years. Abdominal aortic aneurysm (AAA) screening. You may need this if you are a current or former smoker. Osteoporosis. You may be screened starting at age 69 if you are at high risk. Talk with your health care provider about your test  results, treatment options, and if necessary, the need for more tests. Vaccines  Your health care provider may recommend certain vaccines, such as: Influenza vaccine. This is recommended every year. Tetanus, diphtheria, and acellular pertussis (Tdap, Td) vaccine. You may need a Td booster every 10 years. Zoster vaccine. You may need this after age 69. Pneumococcal 13-valent  conjugate (PCV13) vaccine. One dose is recommended after age 35. Pneumococcal polysaccharide (PPSV23) vaccine. One dose is recommended after age 70. Talk to your health care provider about which screenings and vaccines you need and how often you need them. This information is not intended to replace advice given to you by your health care provider. Make sure you discuss any questions you have with your health care provider. Document Released: 01/12/2016 Document Revised: 09/04/2016 Document Reviewed: 10/17/2015 Elsevier Interactive Patient Education  2017 [employer] Prevention in the Home Falls can cause injuries. They can happen to people of all ages. There are many things you can do to make your home safe and to help prevent falls. What can I do on the outside of my home? Regularly fix the edges of walkways and driveways and fix any cracks. Remove anything that might make you trip as you walk through a door, such as a raised step or threshold. Trim any bushes or trees on the path to your home. Use bright outdoor lighting. Clear any walking paths of anything that might make someone trip, such as rocks or tools. Regularly check to see if handrails are loose or broken. Make sure that both sides of any steps have handrails. Any raised decks and porches should have guardrails on the edges. Have any leaves, snow, or ice cleared regularly. Use sand or salt on walking paths during winter. Clean up any spills in your garage right away. This includes oil or grease spills. What can I do in the bathroom? Use night lights. Install grab bars by the toilet and in the tub and shower. Do not use towel bars as grab bars. Use non-skid mats or decals in the tub or shower. If you need to sit down in the shower, use a plastic, non-slip stool. Keep the floor dry. Clean up any water that spills on the floor as soon as it happens. Remove soap buildup in the tub or shower regularly. Attach bath mats  securely with double-sided non-slip rug tape. Do not have throw rugs and other things on the floor that can make you trip. What can I do in the bedroom? Use night lights. Make sure that you have a light by your bed that is easy to reach. Do not use any sheets or blankets that are too big for your bed. They should not hang down onto the floor. Have a firm chair that has side arms. You can use this for support while you get dressed. Do not have throw rugs and other things on the floor that can make you trip. What can I do in the kitchen? Clean up any spills right away. Avoid walking on wet floors. Keep items that you use a lot in easy-to-reach places. If you need to reach something above you, use a strong step stool that has a grab bar. Keep electrical cords out of the way. Do not use floor polish or wax that makes floors slippery. If you must use wax, use non-skid floor wax. Do not have throw rugs and other things on the floor that can make you trip. What can I do with my stairs?  Do not leave any items on the stairs. Make sure that there are handrails on both sides of the stairs and use them. Fix handrails that are broken or loose. Make sure that handrails are as long as the stairways. Check any carpeting to make sure that it is firmly attached to the stairs. Fix any carpet that is loose or worn. Avoid having throw rugs at the top or bottom of the stairs. If you do have throw rugs, attach them to the floor with carpet tape. Make sure that you have a light switch at the top of the stairs and the bottom of the stairs. If you do not have them, ask someone to add them for you. What else can I do to help prevent falls? Wear shoes that: Do not have high heels. Have rubber bottoms. Are comfortable and fit you well. Are closed at the toe. Do not wear sandals. If you use a stepladder: Make sure that it is fully opened. Do not climb a closed stepladder. Make sure that both sides of the stepladder  are locked into place. Ask someone to hold it for you, if possible. Clearly mark and make sure that you can see: Any grab bars or handrails. First and last steps. Where the edge of each step is. Use tools that help you move around (mobility aids) if they are needed. These include: Canes. Walkers. Scooters. Crutches. Turn on the lights when you go into a dark area. Replace any light bulbs as soon as they burn out. Set up your furniture so you have a clear path. Avoid moving your furniture around. If any of your floors are uneven, fix them. If there are any pets around you, be aware of where they are. Review your medicines with your doctor. Some medicines can make you feel dizzy. This can increase your chance of falling. Ask your doctor what other things that you can do to help prevent falls. This information is not intended to replace advice given to you by your health care provider. Make sure you discuss any questions you have with your health care provider. Document Released: 10/12/2009 Document Revised: 05/23/2016 Document Reviewed: 01/20/2015 Elsevier Interactive Patient Education  2017 [employer].

## 2022-11-28 DIAGNOSIS — H524 Presbyopia: Secondary | ICD-10-CM | POA: Diagnosis not present

## 2022-11-28 DIAGNOSIS — H40023 Open angle with borderline findings, high risk, bilateral: Secondary | ICD-10-CM | POA: Diagnosis not present

## 2022-11-28 DIAGNOSIS — H2513 Age-related nuclear cataract, bilateral: Secondary | ICD-10-CM | POA: Diagnosis not present

## 2022-12-02 ENCOUNTER — Telehealth: Payer: Self-pay

## 2022-12-02 NOTE — Telephone Encounter (Signed)
Pt called stating that he has not been contacted by ortho yet. Referral was placed in Oct. I gave pt the number associated with Dr Turner Daniels. Please advise

## 2022-12-12 ENCOUNTER — Ambulatory Visit (INDEPENDENT_AMBULATORY_CARE_PROVIDER_SITE_OTHER): Payer: Medicare Other | Admitting: Family Medicine

## 2022-12-12 ENCOUNTER — Encounter: Payer: Self-pay | Admitting: Family Medicine

## 2022-12-12 VITALS — BP 120/78 | HR 87 | Temp 98.1°F | Ht 72.0 in | Wt 156.2 lb

## 2022-12-12 DIAGNOSIS — R051 Acute cough: Secondary | ICD-10-CM | POA: Diagnosis not present

## 2022-12-12 DIAGNOSIS — J180 Bronchopneumonia, unspecified organism: Secondary | ICD-10-CM | POA: Diagnosis not present

## 2022-12-12 LAB — POCT INFLUENZA A/B
Influenza A, POC: NEGATIVE
Influenza B, POC: NEGATIVE

## 2022-12-12 LAB — POC COVID19 BINAXNOW: SARS Coronavirus 2 Ag: NEGATIVE

## 2022-12-12 MED ORDER — DOXYCYCLINE HYCLATE 100 MG PO CAPS: 100.0000 mg | ORAL_CAPSULE | Freq: Two times a day (BID) | ORAL | 0 refills | Status: AC

## 2022-12-12 MED ORDER — HYDROCODONE BIT-HOMATROP MBR 5-1.5 MG/5ML PO SOLN: ORAL | 0 refills | Status: DC

## 2022-12-12 NOTE — Progress Notes (Signed)
OFFICE VISIT  12/12/2022  CC:  Chief Complaint  Patient presents with   Chest Congestion    Has taken Mucinex (12 hr), cold tablets (day & night), Robitussin, Emergen-C immune. Last dose, mid-day yesterday.    Headache   Body Aches   Fatigue    Symptoms started 9 days ago (12/5) , last home covid test was last Fri (12/8).    Cough    Productive, green phlegm. Unable to sleep due to coughing.    Patient is a 81 y.o. male who presents accompanied by his wife for cough.  HPI: Onset 9 d/a: Nasal congestion, runny nose, postnasal drip, cough, body aches.  Temperature up to 100.4.  Some headache and sore throat.  No pain in the face.  No nausea, vomiting, or diarrhea. Several days ago he tried a COVID test at home and it was negative. Has been taking Mucinex and it does help expectorate mucus.  ROS as above, plus-->  no SOB, no wheezing, no dizziness, no rashes, no melena/hematochezia.  No polyuria or polydipsia.  No recent changes in lower legs.  No palpitations.     Past Medical History:  Diagnosis Date   BPH with obstruction/lower urinary tract symptoms    orthostatic hypotension on flomax   Chronic hoarseness    Eczema    Legs.  Triamcinolone   GERD (gastroesophageal reflux disease)    OSA (obstructive sleep apnea)    quit cpap in 2017   Osteoarthritis, multiple sites    Paget's disease of bone    Zoledronic acid 2002 and again in 2012.  Monitoring alkaline phosphatase levels.   Parkinson's disease    Diagnosed 2018    Past Surgical History:  Procedure Laterality Date   COLONOSCOPY  2015   2015 normal--no further screening indicated   INGUINAL HERNIA REPAIR Right 07/14/2018   Procedure: OPEN RIGHT INGUINAL HERNIA REPAIR ERAS PATHWAY;  Surgeon: Claud Kelp, MD;  Location: [facility];  Service: General;  Laterality: Right;   INSERTION OF MESH Right 07/14/2018   Procedure: INSERTION OF MESH;  Surgeon: Claud Kelp, MD;  Location: [facility];  Service: General;  Laterality: Right;   KNEE SURGERY Right 2007   arthroscopic   VASECTOMY  1975    Outpatient Medications Prior to Visit  Medication Sig Dispense Refill   carbidopa-levodopa (SINEMET CR) 50-200 MG tablet TAKE 1 TABLET BY MOUTH AT  BEDTIME 90 tablet 1   carbidopa-levodopa (SINEMET IR) 25-100 MG tablet Take 2 tablets by mouth 4 (four) times daily. 720 tablet 0   Multiple Vitamin (MULTIVITAMIN ADULT PO) Take by mouth daily.     Psyllium (METAMUCIL PO) Take by mouth daily.     No facility-administered medications prior to visit.    Allergies  Allergen Reactions   Other     Had rash on arm after pneumonia and flu vaccine together - never had either separately prior to this reaction   Statins     Weakness/myalgia     ROS As per HPI  PE:    12/12/2022   11:05 AM 11/06/2022   10:02 AM 10/25/2022    2:49 PM  Vitals with BMI  Height 6\' 0"   6\' 0"   Weight 156 lbs 3 oz 152 lbs 152 lbs 13 oz  BMI 21.18 20.61 20.72  Systolic 120  115  Diastolic 78  75  Pulse 87  69  02 sat 99% RA  Physical Exam  VS: noted--normal. Gen: alert, NAD, NONTOXIC APPEARING.  HEENT: eyes without injection, drainage, or swelling.  Ears: EACs clear, TMs with normal light reflex and landmarks.  Nose: Clear rhinorrhea, with some dried, crusty exudate adherent to mildly injected mucosa.  No purulent d/c.  No paranasal sinus TTP.  No facial swelling.  Throat and mouth without focal lesion.  No pharyngial swelling, erythema, or exudate.   Neck: supple, no LAD.   LUNGS: CTA bilat, nonlabored resps.   CV: RRR, some ectopy noted.  No m/r/g. EXT: no c/c/e SKIN: no rash   LABS:  Last CBC Lab Results  Component Value Date   WBC 4.5 06/12/2022   HGB 13.4 06/12/2022   HCT 40.0 06/12/2022   MCV 94.4 06/12/2022   MCH 31.1 07/08/2018   RDW 14.2 06/12/2022   PLT 174.0 06/12/2022   Last metabolic panel Lab Results  Component Value Date   GLUCOSE 80 06/12/2022   NA 140 06/12/2022    K 4.3 06/12/2022   CL 104 06/12/2022   CO2 29 06/12/2022   BUN 24 (H) 06/12/2022   CREATININE 0.87 06/12/2022   GFRNONAA >60 07/08/2018   CALCIUM 9.2 06/12/2022   PROT 7.4 06/12/2022   ALBUMIN 4.2 06/12/2022   BILITOT 0.5 06/12/2022   ALKPHOS 88 06/12/2022   AST 17 06/12/2022   ALT 8 06/12/2022   ANIONGAP 5 07/08/2018   IMPRESSION AND PLAN:  Acute sinusitis and acute bronchopneumonia. Doxycycline 100 twice daily x 10 days. Hycodan syrup, 1 to 2 teaspoon twice daily as needed. Therapeutic expectations and side effect profile of medication discussed today.  Patient's questions answered. Okay to continue either Robitussin or Mucinex plain.  Flu and covid today negative.  An After Visit Summary was printed and given to the patient.  FOLLOW UP: No follow-ups on file.  Signed:  Santiago Bumpers, MD           12/12/2022

## 2022-12-31 DIAGNOSIS — G20A1 Parkinson's disease without dyskinesia, without mention of fluctuations: Secondary | ICD-10-CM | POA: Diagnosis not present

## 2023-01-02 DIAGNOSIS — R948 Abnormal results of function studies of other organs and systems: Secondary | ICD-10-CM | POA: Diagnosis not present

## 2023-01-02 DIAGNOSIS — G20A1 Parkinson's disease without dyskinesia, without mention of fluctuations: Secondary | ICD-10-CM | POA: Diagnosis not present

## 2023-01-02 DIAGNOSIS — R1312 Dysphagia, oropharyngeal phase: Secondary | ICD-10-CM | POA: Diagnosis not present

## 2023-01-02 DIAGNOSIS — R498 Other voice and resonance disorders: Secondary | ICD-10-CM | POA: Diagnosis not present

## 2023-01-09 ENCOUNTER — Ambulatory Visit (INDEPENDENT_AMBULATORY_CARE_PROVIDER_SITE_OTHER): Payer: Medicare Other | Admitting: Family Medicine

## 2023-01-09 ENCOUNTER — Inpatient Hospital Stay (HOSPITAL_COMMUNITY)
Admission: EM | Admit: 2023-01-09 | Discharge: 2023-01-14 | DRG: 287 | Disposition: A | Discharge status: Home / Self Care | Payer: Medicare Other | Attending: Internal Medicine | Admitting: Internal Medicine

## 2023-01-09 ENCOUNTER — Emergency Department (HOSPITAL_COMMUNITY): Payer: Medicare Other

## 2023-01-09 ENCOUNTER — Encounter: Payer: Self-pay | Admitting: Family Medicine

## 2023-01-09 ENCOUNTER — Other Ambulatory Visit: Payer: Self-pay

## 2023-01-09 VITALS — BP 87/57 | HR 106 | Temp 97.3°F | Wt 168.6 lb

## 2023-01-09 DIAGNOSIS — I499 Cardiac arrhythmia, unspecified: Secondary | ICD-10-CM

## 2023-01-09 DIAGNOSIS — G20A1 Parkinson's disease without dyskinesia, without mention of fluctuations: Secondary | ICD-10-CM | POA: Diagnosis present

## 2023-01-09 DIAGNOSIS — I255 Ischemic cardiomyopathy: Secondary | ICD-10-CM | POA: Diagnosis present

## 2023-01-09 DIAGNOSIS — R29810 Facial weakness: Secondary | ICD-10-CM | POA: Diagnosis not present

## 2023-01-09 DIAGNOSIS — Z79899 Other long term (current) drug therapy: Secondary | ICD-10-CM | POA: Diagnosis not present

## 2023-01-09 DIAGNOSIS — I4819 Other persistent atrial fibrillation: Secondary | ICD-10-CM | POA: Diagnosis not present

## 2023-01-09 DIAGNOSIS — R0602 Shortness of breath: Secondary | ICD-10-CM | POA: Diagnosis not present

## 2023-01-09 DIAGNOSIS — Z8261 Family history of arthritis: Secondary | ICD-10-CM

## 2023-01-09 DIAGNOSIS — I4891 Unspecified atrial fibrillation: Secondary | ICD-10-CM

## 2023-01-09 DIAGNOSIS — J90 Pleural effusion, not elsewhere classified: Secondary | ICD-10-CM | POA: Diagnosis not present

## 2023-01-09 DIAGNOSIS — R600 Localized edema: Secondary | ICD-10-CM | POA: Diagnosis not present

## 2023-01-09 DIAGNOSIS — Z1152 Encounter for screening for COVID-19: Secondary | ICD-10-CM | POA: Diagnosis not present

## 2023-01-09 DIAGNOSIS — R471 Dysarthria and anarthria: Secondary | ICD-10-CM | POA: Diagnosis not present

## 2023-01-09 DIAGNOSIS — I110 Hypertensive heart disease with heart failure: Principal | ICD-10-CM | POA: Diagnosis present

## 2023-01-09 DIAGNOSIS — I2510 Atherosclerotic heart disease of native coronary artery without angina pectoris: Secondary | ICD-10-CM | POA: Diagnosis present

## 2023-01-09 DIAGNOSIS — G4733 Obstructive sleep apnea (adult) (pediatric): Secondary | ICD-10-CM | POA: Insufficient documentation

## 2023-01-09 DIAGNOSIS — Z841 Family history of disorders of kidney and ureter: Secondary | ICD-10-CM | POA: Diagnosis not present

## 2023-01-09 DIAGNOSIS — Z8249 Family history of ischemic heart disease and other diseases of the circulatory system: Secondary | ICD-10-CM

## 2023-01-09 DIAGNOSIS — Z823 Family history of stroke: Secondary | ICD-10-CM | POA: Diagnosis not present

## 2023-01-09 DIAGNOSIS — I480 Paroxysmal atrial fibrillation: Secondary | ICD-10-CM | POA: Diagnosis not present

## 2023-01-09 DIAGNOSIS — Z888 Allergy status to other drugs, medicaments and biological substances status: Secondary | ICD-10-CM | POA: Diagnosis not present

## 2023-01-09 DIAGNOSIS — M7989 Other specified soft tissue disorders: Secondary | ICD-10-CM | POA: Diagnosis not present

## 2023-01-09 DIAGNOSIS — I5043 Acute on chronic combined systolic (congestive) and diastolic (congestive) heart failure: Secondary | ICD-10-CM

## 2023-01-09 DIAGNOSIS — I5021 Acute systolic (congestive) heart failure: Secondary | ICD-10-CM | POA: Diagnosis not present

## 2023-01-09 DIAGNOSIS — I509 Heart failure, unspecified: Secondary | ICD-10-CM

## 2023-01-09 DIAGNOSIS — D6869 Other thrombophilia: Secondary | ICD-10-CM | POA: Diagnosis not present

## 2023-01-09 DIAGNOSIS — H903 Sensorineural hearing loss, bilateral: Secondary | ICD-10-CM | POA: Diagnosis not present

## 2023-01-09 DIAGNOSIS — I2582 Chronic total occlusion of coronary artery: Secondary | ICD-10-CM | POA: Diagnosis not present

## 2023-01-09 DIAGNOSIS — M889 Osteitis deformans of unspecified bone: Secondary | ICD-10-CM | POA: Diagnosis not present

## 2023-01-09 DIAGNOSIS — I959 Hypotension, unspecified: Secondary | ICD-10-CM | POA: Diagnosis not present

## 2023-01-09 DIAGNOSIS — Z87891 Personal history of nicotine dependence: Secondary | ICD-10-CM | POA: Diagnosis not present

## 2023-01-09 DIAGNOSIS — I5023 Acute on chronic systolic (congestive) heart failure: Secondary | ICD-10-CM | POA: Diagnosis not present

## 2023-01-09 DIAGNOSIS — K219 Gastro-esophageal reflux disease without esophagitis: Secondary | ICD-10-CM | POA: Diagnosis present

## 2023-01-09 DIAGNOSIS — R918 Other nonspecific abnormal finding of lung field: Secondary | ICD-10-CM | POA: Diagnosis not present

## 2023-01-09 DIAGNOSIS — J811 Chronic pulmonary edema: Secondary | ICD-10-CM | POA: Diagnosis not present

## 2023-01-09 HISTORY — DX: Transient cerebral ischemic attack, unspecified: G45.9

## 2023-01-09 HISTORY — DX: Ischemic cardiomyopathy: I25.5

## 2023-01-09 HISTORY — DX: Unspecified atrial fibrillation: I48.91

## 2023-01-09 HISTORY — DX: Atherosclerotic heart disease of native coronary artery without angina pectoris: I25.10

## 2023-01-09 LAB — CBC WITH DIFFERENTIAL/PLATELET
Abs Immature Granulocytes: 0.01 10*3/uL (ref 0.00–0.07)
Basophils Absolute: 0 10*3/uL (ref 0.0–0.1)
Basophils Relative: 1 %
Eosinophils Absolute: 0.2 10*3/uL (ref 0.0–0.5)
Eosinophils Relative: 5 %
HCT: 38.1 % — ABNORMAL LOW (ref 39.0–52.0)
Hemoglobin: 12.5 g/dL — ABNORMAL LOW (ref 13.0–17.0)
Immature Granulocytes: 0 %
Lymphocytes Relative: 20 %
Lymphs Abs: 0.9 10*3/uL (ref 0.7–4.0)
MCH: 31.6 pg (ref 26.0–34.0)
MCHC: 32.8 g/dL (ref 30.0–36.0)
MCV: 96.5 fL (ref 80.0–100.0)
Monocytes Absolute: 0.5 10*3/uL (ref 0.1–1.0)
Monocytes Relative: 12 %
Neutro Abs: 2.7 10*3/uL (ref 1.7–7.7)
Neutrophils Relative %: 62 %
Platelets: 190 10*3/uL (ref 150–400)
RBC: 3.95 MIL/uL — ABNORMAL LOW (ref 4.22–5.81)
RDW: 14.6 % (ref 11.5–15.5)
WBC: 4.4 10*3/uL (ref 4.0–10.5)
nRBC: 0 % (ref 0.0–0.2)

## 2023-01-09 LAB — BRAIN NATRIURETIC PEPTIDE: B Natriuretic Peptide: 1095.4 pg/mL — ABNORMAL HIGH (ref 0.0–100.0)

## 2023-01-09 LAB — BASIC METABOLIC PANEL
Anion gap: 8 (ref 5–15)
BUN: 23 mg/dL (ref 8–23)
CO2: 21 mmol/L — ABNORMAL LOW (ref 22–32)
Calcium: 8.3 mg/dL — ABNORMAL LOW (ref 8.9–10.3)
Chloride: 108 mmol/L (ref 98–111)
Creatinine, Ser: 0.94 mg/dL (ref 0.61–1.24)
GFR, Estimated: >60 mL/min (ref >60)
Glucose, Bld: 102 mg/dL — ABNORMAL HIGH (ref 70–99)
Potassium: 4.1 mmol/L (ref 3.5–5.1)
Sodium: 137 mmol/L (ref 135–145)

## 2023-01-09 LAB — RESP PANEL BY RT-PCR (RSV, FLU A&B, COVID)  RVPGX2
Influenza A by PCR: NEGATIVE
Influenza B by PCR: NEGATIVE
Resp Syncytial Virus by PCR: NEGATIVE
SARS Coronavirus 2 by RT PCR: NEGATIVE

## 2023-01-09 LAB — TROPONIN I (HIGH SENSITIVITY)
Troponin I (High Sensitivity): 20 ng/L — ABNORMAL HIGH (ref <18)
Troponin I (High Sensitivity): 20 ng/L — ABNORMAL HIGH (ref <18)

## 2023-01-09 LAB — TSH: TSH: 3.404 u[IU]/mL (ref 0.350–4.500)

## 2023-01-09 LAB — MAGNESIUM: Magnesium: 2.1 mg/dL (ref 1.7–2.4)

## 2023-01-09 MED ORDER — ADULT MULTIVITAMIN W/MINERALS CH
1.0000 | ORAL_TABLET | Freq: Every day | ORAL | Status: DC
  Administered 2023-01-10 – 2023-01-14 (×5): 1 via ORAL
  Filled 2023-01-09 (×5): qty 1

## 2023-01-09 MED ORDER — ONDANSETRON HCL 4 MG/2ML IJ SOLN: 4.0000 mg | Freq: Four times a day (QID) | INTRAMUSCULAR | Status: DC | PRN

## 2023-01-09 MED ORDER — CARBIDOPA-LEVODOPA ER 50-200 MG PO TBCR
1.0000 | EXTENDED_RELEASE_TABLET | Freq: Every day | ORAL | Status: DC
  Administered 2023-01-09 – 2023-01-13 (×5): 1 via ORAL
  Filled 2023-01-09 (×6): qty 1

## 2023-01-09 MED ORDER — SODIUM CHLORIDE 0.9 % IV SOLN: 250.0000 mL | INTRAVENOUS | Status: DC | PRN

## 2023-01-09 MED ORDER — SODIUM CHLORIDE 0.9% FLUSH: 3.0000 mL | INTRAVENOUS | Status: DC | PRN

## 2023-01-09 MED ORDER — ENOXAPARIN SODIUM 40 MG/0.4ML IJ SOSY: 40.0000 mg | PREFILLED_SYRINGE | Freq: Every day | INTRAMUSCULAR | Status: DC

## 2023-01-09 MED ORDER — FUROSEMIDE 10 MG/ML IJ SOLN: 40.0000 mg | Freq: Two times a day (BID) | INTRAMUSCULAR | Status: DC

## 2023-01-09 MED ORDER — SODIUM CHLORIDE 0.9% FLUSH
3.0000 mL | Freq: Two times a day (BID) | INTRAVENOUS | Status: DC
  Administered 2023-01-10 – 2023-01-14 (×7): 3 mL via INTRAVENOUS

## 2023-01-09 MED ORDER — FUROSEMIDE 10 MG/ML IJ SOLN
20.0000 mg | Freq: Once | INTRAMUSCULAR | Status: AC
  Administered 2023-01-09: 20 mg via INTRAVENOUS
  Filled 2023-01-09: qty 2

## 2023-01-09 MED ORDER — ACETAMINOPHEN 325 MG PO TABS: 650.0000 mg | ORAL_TABLET | ORAL | Status: DC | PRN

## 2023-01-09 MED ORDER — CARBIDOPA-LEVODOPA 25-100 MG PO TABS
2.0000 | ORAL_TABLET | Freq: Four times a day (QID) | ORAL | Status: DC
  Administered 2023-01-10 – 2023-01-14 (×17): 2 via ORAL
  Filled 2023-01-09 (×20): qty 2

## 2023-01-09 NOTE — ED Provider Notes (Signed)
[facility] EMERGENCY DEPARTMENT Provider Note   CSN: [id number] Arrival date & time: 01/09/23  1534     History  Chief Complaint  Patient presents with   Leg Swelling    Mario George is a 82 y.o. male.  Patient is an 82 year old male with a past medical history of Parkinson's presenting to the emergency department from his primary care doctor's office with new onset A-fib and shortness of breath.  The patient states that he has had increased swelling in his legs over the last 6 to 8 weeks.  He states that he has had increasing shortness of breath on exertion as well as orthopnea.  He states he was seen by his primary doctor today who noted that he was newly in A-fib and recommended that he come to the emergency department.  The patient states that he has intermittent chest pain that he attributes to acid reflux but states he does not have any active chest pain at this time.  He denies any recent fevers or chills, nausea, vomiting or diarrhea.  He states that he has had a lingering cough from a recent viral infection about a month ago.  The history is provided by the patient and a relative.       Home Medications Prior to Admission medications   Medication Sig Start Date End Date Taking? Authorizing Provider  carbidopa-levodopa (SINEMET CR) 50-200 MG tablet TAKE 1 TABLET BY MOUTH AT  BEDTIME 11/05/22   McGowen, Maryjean Morn, MD  carbidopa-levodopa (SINEMET IR) 25-100 MG tablet Take 2 tablets by mouth 4 (four) times daily. 10/11/22   McGowen, Maryjean Morn, MD  HYDROcodone bit-homatropine [facility] 5-1.5 MG/5ML syrup 1-2 tsp po bid prn cough 12/12/22   McGowen, Maryjean Morn, MD  Multiple Vitamin (MULTIVITAMIN ADULT PO) Take by mouth daily.    [provider]  Psyllium (METAMUCIL PO) Take by mouth daily.    [provider]      Allergies    Other and Statins    Review of Systems   Review of Systems  Physical Exam Updated Vital Signs BP (!) 128/101   Pulse  (!) 56   Temp 97.8 F (36.6 C) (Oral)   Resp 19   Ht 6' (1.829 m)   Wt 76.2 kg   SpO2 97%   BMI 22.78 kg/m  Physical Exam Vitals and nursing note reviewed.  Constitutional:      General: He is not in acute distress.    Appearance: Normal appearance.  HENT:     Head: Normocephalic and atraumatic.     Nose: Nose normal.     Mouth/Throat:     Mouth: Mucous membranes are moist.     Pharynx: Oropharynx is clear.  Eyes:     Extraocular Movements: Extraocular movements intact.     Conjunctiva/sclera: Conjunctivae normal.  Neck:     Comments: JVD above clavicle Cardiovascular:     Rate and Rhythm: Normal rate. Rhythm irregular.     Pulses: Normal pulses.     Heart sounds: Normal heart sounds.  Pulmonary:     Effort: Pulmonary effort is normal.     Breath sounds: Normal breath sounds.  Abdominal:     General: Abdomen is flat.     Palpations: Abdomen is soft.     Tenderness: There is no abdominal tenderness.  Musculoskeletal:        General: Normal range of motion.     Cervical back: Normal range of motion and neck supple.  Right lower leg: Edema (2+ to knee) present.     Left lower leg: Edema (2+ to knee) present.  Skin:    General: Skin is warm and dry.  Neurological:     General: No focal deficit present.     Mental Status: He is alert and oriented to person, place, and time.  Psychiatric:        Mood and Affect: Mood normal.        Behavior: Behavior normal.     ED Results / Procedures / Treatments   Labs (all labs ordered are listed, but only abnormal results are displayed) Labs Reviewed  BASIC METABOLIC PANEL - Abnormal; Notable for the following components:      Result Value   CO2 21 (*)    Glucose, Bld 102 (*)    Calcium 8.3 (*)    All other components within normal limits  CBC WITH DIFFERENTIAL/PLATELET - Abnormal; Notable for the following components:   RBC 3.95 (*)    Hemoglobin 12.5 (*)    HCT 38.1 (*)    All other components within normal limits   BRAIN NATRIURETIC PEPTIDE - Abnormal; Notable for the following components:   B Natriuretic Peptide 1,095.4 (*)    All other components within normal limits  TROPONIN I (HIGH SENSITIVITY) - Abnormal; Notable for the following components:   Troponin I (High Sensitivity) 20 (*)    All other components within normal limits  RESP PANEL BY RT-PCR (RSV, FLU A&B, COVID)  RVPGX2  MAGNESIUM  TSH  TROPONIN I (HIGH SENSITIVITY)    EKG EKG Interpretation  Date/Time:  Thursday January 09 2023 15:48:25 EST Ventricular Rate:  94 PR Interval:    QRS Duration: 88 QT Interval:  376 QTC Calculation: 470 R Axis:   51 Text Interpretation: Atrial fibrillation Anterior infarct , age undetermined Abnormal ECG No previous ECGs available Confirmed by Leanord Asal ([id number]) on 01/09/2023 6:06:38 PM  Radiology DG Chest 2 View  Result Date: 01/09/2023 CLINICAL DATA:  Shortness of breath, leg swelling EXAM: CHEST - 2 VIEW COMPARISON:  None Available. FINDINGS: Heart size is upper limits of normal. Aortic atherosclerosis. Pulmonary vascular congestion. Mild diffuse interstitial prominence with patchy right basilar opacity. Small bilateral pleural effusions. No pneumothorax. IMPRESSION: Findings suggestive of CHF with pulmonary edema and small bilateral pleural effusions. Electronically Signed   By: Davina Poke D.O.   On: 01/09/2023 17:04    Procedures Procedures    Medications Ordered in ED Medications  furosemide (LASIX) injection 20 mg (20 mg Intravenous Given 01/09/23 2010)    ED Course/ Medical Decision Making/ A&P                           Medical Decision Making This patient presents to the ED with chief complaint(s) of shortness of breath, leg swelling with pertinent past medical history of Parkinson's which further complicates the presenting complaint. The complaint involves an extensive differential diagnosis and also carries with it a high risk of complications and morbidity.    The  differential diagnosis includes CHF, ACS, patient has no overlying erythema or warmth making cellulitis unlikely, the patient has equal swelling bilaterally making DVT unlikely, hepatic dysfunction, renal dysfunction, arrhythmia, electrolyte abnormality, anemia  Additional history obtained: Additional history obtained from family Records reviewed Primary Care Documents  ED Course and Reassessment: Patient was initially evaluated by provider in triage and had EKG, labs and chest x-ray performed.  EKG is in rate controlled  A-fib and labs and chest x-ray are consistent with new onset CHF with pulmonary edema and pleural effusions and a BNP of greater than thousand.  Patient will be started on IV Lasix.  Patient will additionally have a troponin, mag and TSH to evaluate for possible causes of his new onset A-fib and CHF.  Patient will require admission for further management.  Independent labs interpretation:  The following labs were independently interpreted: elevated BNP, mildly elevated troponin  Independent visualization of imaging: - I independently visualized the following imaging with scope of interpretation limited to determining acute life threatening conditions related to emergency care: CXR, which revealed pulmonary edema with small pleural effusions  Consultation: - Consulted or discussed management/test interpretation w/ external professional: hospitalist  Consideration for admission or further workup: patient requires admission for further management of new onset a fib with new CHF Social Determinants of health: N/A    Amount and/or Complexity of Data Reviewed Labs: ordered.  Risk Prescription drug management. Decision regarding hospitalization.          Final Clinical Impression(s) / ED Diagnoses Final diagnoses:  Atrial fibrillation, new onset ([place])  Acute congestive heart failure, unspecified heart failure type [facility])    Rx / DC Orders ED Discharge Orders      None         Kemper Durie, DO 01/09/23 2239

## 2023-01-09 NOTE — H&P (Signed)
PCP:   Jeoffrey Massed, MD   Chief Complaint:    HPI: This is a 82 year old male with past medical history of patient's disease, essential tremors, PACs, BPH.  Around 6 weeks ago he noted he had been developing swollen ankles.  Not long after, he became quite ill with by respiratory type symptoms.  He had a bad cough and cold, it took him 3 weeks to get over it, it took a lot of energy out of him.  After this he noted that he is ankles are now swollen up to his thigh.  He went to see his PCP, EKG done in the office showed atrial fibrillation.  It was suggested he go to the ER. Per patient he has chest pain but describes this as a GERD type chest pains.  It is worse when he is out walking the dogs in the cold.  Chest pain resolved with cessation of his activity.  He denies any palpitation or lightheadedness  In the ER, patient diagnosed with atrial fibrillation not in RVR.  Heart rate however in the low 100s.  Chest x-ray positive for CHF, these are new diagnosis  Review of Systems:  The patient denies anorexia, fever, weight loss,, vision loss, decreased hearing, hoarseness, chest pain, syncope, dyspnea on exertion, peripheral edema, balance deficits, hemoptysis, abdominal pain, melena, hematochezia, severe indigestion/heartburn, hematuria, incontinence, genital sores, muscle weakness, suspicious skin lesions, transient blindness, difficulty walking, depression, unusual weight change, abnormal bleeding, enlarged lymph nodes, angioedema, and breast masses. Positives: Chest pains, shortness of breath  Past Medical History: Past Medical History:  Diagnosis Date   BPH with obstruction/lower urinary tract symptoms    orthostatic hypotension on flomax   Chronic hoarseness    Eczema    Legs.  Triamcinolone   GERD (gastroesophageal reflux disease)    OSA (obstructive sleep apnea)    quit cpap in 2017   Osteoarthritis, multiple sites    Paget's disease of bone    Zoledronic acid 2002 and again  in 2012.  Monitoring alkaline phosphatase levels.   Parkinson's disease    Diagnosed 2018   Past Surgical History:  Procedure Laterality Date   COLONOSCOPY  2015   2015 normal--no further screening indicated   INGUINAL HERNIA REPAIR Right 07/14/2018   Procedure: OPEN RIGHT INGUINAL HERNIA REPAIR ERAS PATHWAY;  Surgeon: Claud Kelp, MD;  Location: [facility];  Service: General;  Laterality: Right;   INSERTION OF MESH Right 07/14/2018   Procedure: INSERTION OF MESH;  Surgeon: Claud Kelp, MD;  Location: [facility];  Service: General;  Laterality: Right;   KNEE SURGERY Right 2007   arthroscopic   VASECTOMY  1975    Medications: Prior to Admission medications   Medication Sig Start Date End Date Taking? Authorizing Provider  carbidopa-levodopa (SINEMET CR) 50-200 MG tablet TAKE 1 TABLET BY MOUTH AT  BEDTIME 11/05/22   McGowen, Maryjean Morn, MD  carbidopa-levodopa (SINEMET IR) 25-100 MG tablet Take 2 tablets by mouth 4 (four) times daily. 10/11/22   McGowen, Maryjean Morn, MD  HYDROcodone bit-homatropine [facility]) 5-1.5 MG/5ML syrup 1-2 tsp po bid prn cough 12/12/22   McGowen, Maryjean Morn, MD  Multiple Vitamin (MULTIVITAMIN ADULT PO) Take by mouth daily.    [provider]  Psyllium (METAMUCIL PO) Take by mouth daily.    [provider]    Allergies:   Allergies  Allergen Reactions   Other     Had rash on arm after pneumonia and flu vaccine together -  never had either separately prior to this reaction   Statins     Weakness/myalgia     Social History:  reports that he quit smoking about 40 years ago. His smoking use included cigarettes. He has never used smokeless tobacco. He reports current alcohol use. He reports that he does not use drugs.  Family History: Family History  Problem Relation Age of Onset   Cancer Mother        intestinal   Early death Mother    Alcohol abuse Mother    Liver disease Father        ETOH   Alcohol  abuse Father    Arthritis Father    Stroke Sister    Kidney disease Sister    Stroke Sister    Stroke Brother    Arthritis Paternal Grandmother    Early death Child     Physical Exam: Vitals:   01/09/23 2004 01/09/23 2036 01/09/23 2130 01/09/23 2200  BP:  (!) 133/112 (!) 139/92 (!) 128/101  Pulse:  94 (!) 114 (!) 56  Resp:  12 20 19   Temp: 97.8 F (36.6 C)     TempSrc: Oral     SpO2:  100% 90% 97%  Weight:      Height:        General:  Alert and oriented times three, well developed and nourished, no acute distress Eyes: PERRLA, pink conjunctiva, no scleral icterus ENT: Moist oral mucosa, neck supple, no thyromegaly, +JVD Lungs: clear to ascultation, no wheeze, no crackles, no use of accessory muscles Cardiovascular: irregular rate and irregular rhythm, no regurgitation, no gallops, no murmurs. No carotid bruits, no JVD Abdomen: soft, positive BS, non-tender, non-distended, no organomegaly, not an acute abdomen GU: not examined Neuro: CN II - XII grossly intact, sensation intact Musculoskeletal: strength 5/5 all extremities, no clubbing, cyanosis. ++B/L LE edema up to thigh Skin: no rash, no subcutaneous crepitation, no decubitus Psych: appropriate patient   Labs on Admission:  Recent Labs    01/09/23 1642 01/09/23 2018  NA 137  --   K 4.1  --   CL 108  --   CO2 21*  --   GLUCOSE 102*  --   BUN 23  --   CREATININE 0.94  --   CALCIUM 8.3*  --   MG  --  2.1    Recent Labs    01/09/23 1642  WBC 4.4  NEUTROABS 2.7  HGB 12.5*  HCT 38.1*  MCV 96.5  PLT 190    Recent Labs    01/09/23 2018  TSH 3.404    Micro Results: Recent Results (from the past 240 hour(s))  Resp panel by RT-PCR (RSV, Flu A&B, Covid) Anterior Nasal Swab     Status: None   Collection Time: 01/09/23  8:26 PM   Specimen: Anterior Nasal Swab  Result Value Ref Range Status   SARS Coronavirus 2 by RT PCR NEGATIVE NEGATIVE Final    Comment: (NOTE) SARS-CoV-2 target nucleic acids are  NOT DETECTED.  The SARS-CoV-2 RNA is generally detectable in upper respiratory specimens during the acute phase of infection. The lowest concentration of SARS-CoV-2 viral copies this assay can detect is 138 copies/mL. A negative result does not preclude SARS-Cov-2 infection and should not be used as the sole basis for treatment or other patient management decisions. A negative result may occur with  improper specimen collection/handling, submission of specimen other than nasopharyngeal swab, presence of viral mutation(s) within the areas targeted by this assay,  and inadequate number of viral copies(<138 copies/mL). A negative result must be combined with clinical observations, patient history, and epidemiological information. The expected result is Negative.  Fact Sheet for Patients:  EntrepreneurPulse.com.au  Fact Sheet for Healthcare Providers:  IncredibleEmployment.be  This test is no t yet approved or cleared by the [place] FDA and  has been authorized for detection and/or diagnosis of SARS-CoV-2 by FDA under an Emergency Use Authorization (EUA). This EUA will remain  in effect (meaning this test can be used) for the duration of the COVID-19 declaration under Section 564(b)(1) of the Act, 21 U.S.C.section 360bbb-3(b)(1), unless the authorization is terminated  or revoked sooner.       Influenza A by PCR NEGATIVE NEGATIVE Final   Influenza B by PCR NEGATIVE NEGATIVE Final    Comment: (NOTE) The Xpert Xpress SARS-CoV-2/FLU/RSV plus assay is intended as an aid in the diagnosis of influenza from Nasopharyngeal swab specimens and should not be used as a sole basis for treatment. Nasal washings and aspirates are unacceptable for Xpert Xpress SARS-CoV-2/FLU/RSV testing.  Fact Sheet for Patients: EntrepreneurPulse.com.au  Fact Sheet for Healthcare Providers: IncredibleEmployment.be  This test is not  yet approved or cleared by the [place] FDA and has been authorized for detection and/or diagnosis of SARS-CoV-2 by FDA under an Emergency Use Authorization (EUA). This EUA will remain in effect (meaning this test can be used) for the duration of the COVID-19 declaration under Section 564(b)(1) of the Act, 21 U.S.C. section 360bbb-3(b)(1), unless the authorization is terminated or revoked.     Resp Syncytial Virus by PCR NEGATIVE NEGATIVE Final    Comment: (NOTE) Fact Sheet for Patients: EntrepreneurPulse.com.au  Fact Sheet for Healthcare Providers: IncredibleEmployment.be  This test is not yet approved or cleared by the [place] FDA and has been authorized for detection and/or diagnosis of SARS-CoV-2 by FDA under an Emergency Use Authorization (EUA). This EUA will remain in effect (meaning this test can be used) for the duration of the COVID-19 declaration under Section 564(b)(1) of the Act, 21 U.S.C. section 360bbb-3(b)(1), unless the authorization is terminated or revoked.  Performed at [facility], [place] [address] [place], [place] [address]      Radiological Exams on Admission: DG Chest 2 View  Result Date: 01/09/2023 CLINICAL DATA:  Shortness of breath, leg swelling EXAM: CHEST - 2 VIEW COMPARISON:  None Available. FINDINGS: Heart size is upper limits of normal. Aortic atherosclerosis. Pulmonary vascular congestion. Mild diffuse interstitial prominence with patchy right basilar opacity. Small bilateral pleural effusions. No pneumothorax. IMPRESSION: Findings suggestive of CHF with pulmonary edema and small bilateral pleural effusions. Electronically Signed   By: Davina Poke D.O.   On: 01/09/2023 17:04    Assessment/Plan Present on Admission:  Acute congestive heart failure- new -CHF order set initiated -2D echo in a.m. -IV Lasix twice daily, strict I's and O's, daily weights -Currently patient on room  air -Cardiology consult placed  Paroxysmal atrial fibrillation ([place]) - new -Toprol 25 mg p.o. twice daily started -Mali score: 3 (age and HF), anticoagulation started   Parkinson's disease/essential tremors -Continue Sinemet CR, IR   Sensorineural hearing loss (SNHL) of both ears   Aliviana Burdell 01/09/2023, 11:09 PM

## 2023-01-09 NOTE — ED Triage Notes (Signed)
"  Went to PCP for swollen ankles and legs, they did EKG and said I needed to go to ED to get checked out" per pt

## 2023-01-09 NOTE — ED Provider Triage Note (Signed)
Emergency Medicine Provider Triage Evaluation Note  TAOS TAPP , a 82 y.o. male  was evaluated in triage.  Pt complains of bilateral lower extremity swelling and shortness of breath.  Patient has had swelling of the ankles for 6 weeks, but has increased to swelling above the level of the knees in the past week.  Has had to sleep semirecumbent due to the shortness of breath.  Had a cold for most of the month in December.  Does not think he has a history of CHF.  Has had A-fib a couple times in the past but has never been treated for it and describes it as paroxysmal.  Denies chest pain  Review of Systems  Positive: As above Negative: As above  Physical Exam  BP (!) 123/94 (BP Location: Right Arm)   Pulse 90   Temp (!) 97.5 F (36.4 C)   Resp 20   Ht 6' (1.829 m)   Wt 76.2 kg   SpO2 100%   BMI 22.78 kg/m  Gen:   Awake, no distress   Resp:  Normal effort  MSK:   Moves extremities without difficulty  Other:  Significant pitting edema of bilateral lower extremities above the level of the knee  Medical Decision Making  Medically screening exam initiated at 4:24 PM.  Appropriate orders placed.  Micael Hampshire was informed that the remainder of the evaluation will be completed by another provider, this initial triage assessment does not replace that evaluation, and the importance of remaining in the ED until their evaluation is complete.  She is EKG shows A-fib.  Shortness of breath workup initiated   Roylene Reason, Hershal Coria 01/09/23 1626

## 2023-01-09 NOTE — Progress Notes (Signed)
OFFICE VISIT  01/09/2023  CC:  Chief Complaint  Patient presents with   Leg Swelling    Onset November ted hose have not helped.    Patient is a 82 y.o. male who presents for legs swelling.  HPI: Patient began to note swelling in both feet approximately 6 to 8 weeks ago. Then gradually noted accumulation of fluid occurring from the knees down about 10 days ago.  Feels somewhat short of breath and less stamina than usual.    Denies chest pain, palpitations, dizziness, nausea, diaphoresis, arm pain, or jaw pain.  He does feel like he has gained fluid weight, denies any change in sodium intake or calorie intake. Some itching on the lower legs but no pain. No prior history of any swelling of the legs.  ROS as above, plus--> no fevers, no wheezing, no cough,  no HAs, no rashes, no melena/hematochezia.  No polyuria or polydipsia.  No myalgias or arthralgias.  No focal weakness or paresthesias.  No acute vision or hearing abnormalities.  No dysuria or unusual/new urinary urgency or frequency.  No n/v/d or abd pain.    Past Medical History:  Diagnosis Date   BPH with obstruction/lower urinary tract symptoms    orthostatic hypotension on flomax   Chronic hoarseness    Eczema    Legs.  Triamcinolone   GERD (gastroesophageal reflux disease)    OSA (obstructive sleep apnea)    quit cpap in 2017   Osteoarthritis, multiple sites    Paget's disease of bone    Zoledronic acid 2002 and again in 2012.  Monitoring alkaline phosphatase levels.   Parkinson's disease    Diagnosed 2018    Past Surgical History:  Procedure Laterality Date   COLONOSCOPY  2015   2015 normal--no further screening indicated   INGUINAL HERNIA REPAIR Right 07/14/2018   Procedure: OPEN RIGHT INGUINAL HERNIA REPAIR ERAS PATHWAY;  Surgeon: Fanny Skates, MD;  Location: [facility];  Service: General;  Laterality: Right;   INSERTION OF MESH Right 07/14/2018   Procedure: INSERTION OF MESH;  Surgeon:  Fanny Skates, MD;  Location: [facility];  Service: General;  Laterality: Right;   KNEE SURGERY Right 2007   arthroscopic   VASECTOMY  1975    Outpatient Medications Prior to Visit  Medication Sig Dispense Refill   carbidopa-levodopa (SINEMET CR) 50-200 MG tablet TAKE 1 TABLET BY MOUTH AT  BEDTIME 90 tablet 1   carbidopa-levodopa (SINEMET IR) 25-100 MG tablet Take 2 tablets by mouth 4 (four) times daily. 720 tablet 0   HYDROcodone bit-homatropine (HYCODAN) 5-1.5 MG/5ML syrup 1-2 tsp po bid prn cough 120 mL 0   Multiple Vitamin (MULTIVITAMIN ADULT PO) Take by mouth daily.     Psyllium (METAMUCIL PO) Take by mouth daily.     No facility-administered medications prior to visit.    Allergies  Allergen Reactions   Other     Had rash on arm after pneumonia and flu vaccine together - never had either separately prior to this reaction   Statins     Weakness/myalgia     Review of Systems  As per HPI  PE:    01/09/2023    3:48 PM 01/09/2023    3:46 PM 01/09/2023    1:43 PM  Vitals with BMI  Height 6\' 0"     Weight 168 lbs  168 lbs 10 oz  BMI 95.63  87.56  Systolic  433 87  Diastolic  94 57  Pulse  90  106  02 sat 98% RA today  Physical Exam  Gen: Alert, well appearing.  Patient is oriented to person, place, time, and situation. AFFECT: pleasant, lucid thought and speech. CV: irregular rhythm, mild tach (105-110), no m/r. Chest is clear, no wheezing or rales. Normal symmetric air entry throughout both lung fields. No chest wall deformities or tenderness. EXT: warm and pink, 3+ bilat LL pitting edema from knees down into feet. R calf circ 10 below inf pole of patella is 39 cm, L side is 37 cm No tenderness.  LABS:  Last CBC Lab Results  Component Value Date   WBC 4.4 01/09/2023   HGB 12.5 (L) 01/09/2023   HCT 38.1 (L) 01/09/2023   MCV 96.5 01/09/2023   MCH 31.6 01/09/2023   RDW 14.6 01/09/2023   PLT 190 13/07/6577   Last metabolic panel Lab Results   Component Value Date   GLUCOSE 102 (H) 01/09/2023   NA 137 01/09/2023   K 4.1 01/09/2023   CL 108 01/09/2023   CO2 21 (L) 01/09/2023   BUN 23 01/09/2023   CREATININE 0.94 01/09/2023   GFRNONAA >60 01/09/2023   CALCIUM 8.3 (L) 01/09/2023   PROT 7.4 06/12/2022   ALBUMIN 4.2 06/12/2022   BILITOT 0.5 06/12/2022   ALKPHOS 88 06/12/2022   AST 17 06/12/2022   ALT 8 06/12/2022   ANIONGAP 8 01/09/2023   12 lead EKG today: a-fib, rate 114, TWI V3 through V5, No ST segment changes, no Q waves. Low voltage.  Normal QRS duration and QT interval. Compared to EKG 06/21/22, a-fib and TWI are new.  IMPRESSION AND PLAN:  #1 bilateral lower extremity edema.  #2 New diagnosis A-fib, mildly rapid ventricular response.  This patient's CHA2DS2-VASc Score is 2 (age>75).  #3 abnormal EKG--question acute ischemic changes.  #4 hypotension, asymptomatic.  Recommended he go to the emergency department for further evaluation, possible cardiology evaluation.  I did not start any medications here today.  An After Visit Summary was printed and given to the patient.  FOLLOW UP: No follow-ups on file.  Signed:  Crissie Sickles, MD           01/09/2023

## 2023-01-10 ENCOUNTER — Inpatient Hospital Stay (HOSPITAL_COMMUNITY): Payer: Medicare Other

## 2023-01-10 ENCOUNTER — Other Ambulatory Visit (HOSPITAL_COMMUNITY): Payer: Self-pay

## 2023-01-10 DIAGNOSIS — I480 Paroxysmal atrial fibrillation: Secondary | ICD-10-CM

## 2023-01-10 DIAGNOSIS — I5021 Acute systolic (congestive) heart failure: Secondary | ICD-10-CM

## 2023-01-10 LAB — CBC WITH DIFFERENTIAL/PLATELET
Abs Immature Granulocytes: 0.01 10*3/uL (ref 0.00–0.07)
Basophils Absolute: 0 10*3/uL (ref 0.0–0.1)
Basophils Relative: 1 %
Eosinophils Absolute: 0.2 10*3/uL (ref 0.0–0.5)
Eosinophils Relative: 3 %
HCT: 39.6 % (ref 39.0–52.0)
Hemoglobin: 13.2 g/dL (ref 13.0–17.0)
Immature Granulocytes: 0 %
Lymphocytes Relative: 19 %
Lymphs Abs: 1 10*3/uL (ref 0.7–4.0)
MCH: 31.3 pg (ref 26.0–34.0)
MCHC: 33.3 g/dL (ref 30.0–36.0)
MCV: 93.8 fL (ref 80.0–100.0)
Monocytes Absolute: 0.6 10*3/uL (ref 0.1–1.0)
Monocytes Relative: 13 %
Neutro Abs: 3.2 10*3/uL (ref 1.7–7.7)
Neutrophils Relative %: 64 %
Platelets: 192 10*3/uL (ref 150–400)
RBC: 4.22 MIL/uL (ref 4.22–5.81)
RDW: 14.4 % (ref 11.5–15.5)
WBC: 5 10*3/uL (ref 4.0–10.5)
nRBC: 0 % (ref 0.0–0.2)

## 2023-01-10 LAB — ECHOCARDIOGRAM COMPLETE
Area-P 1/2: 8.37 cm2
Calc EF: 32.9 %
Height: 72 in
MV M vel: 4.07 m/s
MV Peak grad: 66.3 mmHg
S' Lateral: 3.4 cm
Single Plane A2C EF: 37 %
Single Plane A4C EF: 27.1 %
Weight: 2688 oz

## 2023-01-10 LAB — BASIC METABOLIC PANEL
Anion gap: 12 (ref 5–15)
BUN: 18 mg/dL (ref 8–23)
CO2: 23 mmol/L (ref 22–32)
Calcium: 8.5 mg/dL — ABNORMAL LOW (ref 8.9–10.3)
Chloride: 102 mmol/L (ref 98–111)
Creatinine, Ser: 0.99 mg/dL (ref 0.61–1.24)
GFR, Estimated: >60 mL/min (ref >60)
Glucose, Bld: 89 mg/dL (ref 70–99)
Potassium: 3.7 mmol/L (ref 3.5–5.1)
Sodium: 137 mmol/L (ref 135–145)

## 2023-01-10 LAB — HEPARIN LEVEL (UNFRACTIONATED): Heparin Unfractionated: 0.24 IU/mL — ABNORMAL LOW (ref 0.30–0.70)

## 2023-01-10 MED ORDER — DAPAGLIFLOZIN PROPANEDIOL 10 MG PO TABS
10.0000 mg | ORAL_TABLET | Freq: Every day | ORAL | Status: DC
  Administered 2023-01-10 – 2023-01-14 (×5): 10 mg via ORAL
  Filled 2023-01-10 (×5): qty 1

## 2023-01-10 MED ORDER — HEPARIN (PORCINE) 25000 UT/250ML-% IV SOLN
1250.0000 [IU]/h | INTRAVENOUS | Status: DC
  Administered 2023-01-10: 1100 [IU]/h via INTRAVENOUS
  Administered 2023-01-11 – 2023-01-12 (×2): 1250 [IU]/h via INTRAVENOUS
  Filled 2023-01-10 (×5): qty 250

## 2023-01-10 MED ORDER — METOPROLOL TARTRATE 25 MG PO TABS
25.0000 mg | ORAL_TABLET | Freq: Two times a day (BID) | ORAL | Status: DC
  Administered 2023-01-10: 25 mg via ORAL
  Filled 2023-01-10: qty 1

## 2023-01-10 MED ORDER — SPIRONOLACTONE 12.5 MG HALF TABLET
12.5000 mg | ORAL_TABLET | Freq: Every day | ORAL | Status: DC
  Administered 2023-01-10 – 2023-01-14 (×5): 12.5 mg via ORAL
  Filled 2023-01-10 (×5): qty 1

## 2023-01-10 MED ORDER — FUROSEMIDE 10 MG/ML IJ SOLN
40.0000 mg | Freq: Two times a day (BID) | INTRAMUSCULAR | Status: DC
  Filled 2023-01-10: qty 4

## 2023-01-10 MED ORDER — ASPIRIN 81 MG PO CHEW
81.0000 mg | CHEWABLE_TABLET | Freq: Every day | ORAL | Status: DC
  Administered 2023-01-10 – 2023-01-12 (×3): 81 mg via ORAL
  Filled 2023-01-10 (×5): qty 1

## 2023-01-10 MED ORDER — ENOXAPARIN SODIUM 80 MG/0.8ML IJ SOSY
1.0000 mg/kg | PREFILLED_SYRINGE | Freq: Two times a day (BID) | INTRAMUSCULAR | Status: DC
  Administered 2023-01-10: 75 mg via SUBCUTANEOUS
  Filled 2023-01-10: qty 0.75

## 2023-01-10 MED ORDER — FUROSEMIDE 10 MG/ML IJ SOLN
40.0000 mg | Freq: Two times a day (BID) | INTRAMUSCULAR | Status: DC
  Administered 2023-01-10: 40 mg via INTRAVENOUS
  Filled 2023-01-10: qty 4

## 2023-01-10 MED ORDER — PERFLUTREN LIPID MICROSPHERE
1.0000 mL | INTRAVENOUS | Status: AC | PRN
  Administered 2023-01-10: 3 mL via INTRAVENOUS

## 2023-01-10 MED ORDER — SODIUM CHLORIDE 0.9% FLUSH
3.0000 mL | Freq: Two times a day (BID) | INTRAVENOUS | Status: DC
  Administered 2023-01-10 – 2023-01-13 (×4): 3 mL via INTRAVENOUS

## 2023-01-10 NOTE — Consult Note (Addendum)
Cardiology Consultation   Patient ID: Mario George MRN: [id number]; DOB: [birth year]  Admit date: 01/09/2023 Date of Consult: 01/10/2023  PCP:  Tammi Sou, MD   [facility] Cardiologist:  Kirk Ruths, MD new   Patient Profile:   Mario George is a 82 y.o. 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 Florene Glen.  History of Present Illness:   Mario George has no prior cardiac history. He was diagnosed with Parkinson's disease in 2018. He follows with neurology and is noted to walk independently. He has freezing of gait and postural instability, per neuro note in Jan 2024. He does have a  history of orthostatic hypotension that has not given him problems recently. Flomax for BPH has been discontinued.   He presented to his PCP after becoming ill for three weeks with a respiratory virus. He was able to recover from the URI, but developed lower extremity edema that progressed to his mid-thighs.  He also reported shortness of breath and less stamina than usual. He also reported orthopnea. EKG in office showed Afib RVR and he was directed to the ER.   On arrival, he reports having Afib "a couple of times in the past" but states he was never treated for it and has not been anticoagulated. He reported leg swelling over the last 2 weeks with some chest pain he attributes to GERD. EKG in the ER showed rate controlled Afib with labs and CXR consistent with CHF. He was treated with IV lasix and cardiology was consulted.   During my interview, he describes ankle swelling 6 weeks ago and then developed URI that lasted 3 weeks. Ankle swelling progressed to his thighs prompting PCP visit. He states he has had Afib in the past but never needed to be treated for it - I ask further questions  and he admits that he does not like taking medications and it may have been a shared decision making process that lead to forgoing [place]. He does describe exertional chest pain over the the last six months. He walks the aisles of the grocery store but rides in [place]. When walking, he as noted chest pain in the center of his chest that improves with rest. He does note that more exertion leads to greater chest pain. He has not fallen in the last six months. He uses a walker for bathroom trips in the middle of the night and a cane until his PD medications take effect in the morning.  He does have a smoking history of about 30 pack years, quit in the mid 1980s. Heart disease in his grandfather (87s), stroke in three of his siblings. He lives at home with his wife. Daughter Mario George is bedside and helps with history.  He retired from [employer] and [employer].  Past Medical History:  Diagnosis Date   BPH with obstruction/lower urinary tract symptoms    orthostatic hypotension on flomax   Chronic hoarseness    Eczema    Legs.  Triamcinolone   GERD (gastroesophageal reflux disease)    OSA (obstructive sleep apnea)    quit cpap in 2017   Osteoarthritis, multiple sites    Paget's disease of bone    Zoledronic acid 2002 and again in 2012.  Monitoring alkaline phosphatase levels.  Parkinson's disease    Diagnosed 2018    Past Surgical History:  Procedure Laterality Date   COLONOSCOPY  2015   2015 normal--no further screening indicated   INGUINAL HERNIA REPAIR Right 07/14/2018   Procedure: OPEN RIGHT INGUINAL HERNIA REPAIR ERAS PATHWAY;  Surgeon: Fanny Skates, MD;  Location: [facility];  Service: General;  Laterality: Right;   INSERTION OF MESH Right 07/14/2018   Procedure: INSERTION OF MESH;  Surgeon: Fanny Skates, MD;  Location: [facility];  Service: General;  Laterality: Right;   KNEE SURGERY Right 2007   arthroscopic   VASECTOMY  1975     Home  Medications:  Prior to Admission medications   Medication Sig Start Date End Date Taking? Authorizing Provider  carbidopa-levodopa (SINEMET CR) 50-200 MG tablet TAKE 1 TABLET BY MOUTH AT  BEDTIME 11/05/22   McGowen, Adrian Blackwater, MD  carbidopa-levodopa (SINEMET IR) 25-100 MG tablet Take 2 tablets by mouth 4 (four) times daily. 10/11/22   McGowen, Adrian Blackwater, MD  HYDROcodone bit-homatropine [facility]) 5-1.5 MG/5ML syrup 1-2 tsp po bid prn cough 12/12/22   McGowen, Adrian Blackwater, MD  Multiple Vitamin (MULTIVITAMIN ADULT PO) Take by mouth daily.    [provider]  Psyllium (METAMUCIL PO) Take by mouth daily.    [provider]    Inpatient Medications: Scheduled Meds:  carbidopa-levodopa  1 tablet Oral QHS   carbidopa-levodopa  2 tablet Oral QID   enoxaparin (LOVENOX) injection  1 mg/kg Subcutaneous BID   furosemide  40 mg Intravenous BID   metoprolol tartrate  25 mg Oral BID   multivitamin with minerals  1 tablet Oral Daily   sodium chloride flush  3 mL Intravenous Q12H   Continuous Infusions:  sodium chloride     PRN Meds: sodium chloride, acetaminophen, ondansetron (ZOFRAN) IV, sodium chloride flush  Allergies:    Allergies  Allergen Reactions   Statins Other (See Comments)    Weakness/myalgia    Other Rash    Had rash on arm after pneumonia and flu vaccine together - never had either separately prior to this reaction.  Zquil - "felt like his skin was crawling"     Social History:   Social History   Socioeconomic History   Marital status: Married    Spouse name: Not on file   Number of children: Not on file   Years of education: Not on file   Highest education level: Not on file  Occupational History   Occupation: retired    Comment: health care admin/hospital  Tobacco Use   Smoking status: Former    Types: Cigarettes    Quit date: 12/30/1982    Years since quitting: 40.0   Smokeless tobacco: Never  Vaping Use   Vaping Use: Never used  Substance and Sexual  Activity   Alcohol use: Yes    Comment: one drink (wine or beer) daily   Drug use: No   Sexual activity: Not Currently  Other Topics Concern   Not on file  Social History Narrative   Married, several children, also grandchildren   Originally from [place].     Moved to [facility] April 2023.   Educ: College   Occup: retired Programmer, applications   No T/A/Ds   Social Determinants of Radio broadcast assistant Strain: Low Risk  (11/06/2022)   Overall Financial Resource Strain (CARDIA)    Difficulty of Paying Living Expenses: Not hard at all  Food Insecurity: No Food Insecurity (11/06/2022)  Hunger Vital Sign    Worried About Running Out of Food in the Last Year: Never true    Ran Out of Food in the Last Year: Never true  Transportation Needs: No Transportation Needs (11/06/2022)   PRAPARE - Administrator, Civil Service (Medical): No    Lack of Transportation (Non-Medical): No  Physical Activity: Sufficiently Active (11/06/2022)   Exercise Vital Sign    Days of Exercise per Week: 5 days    Minutes of Exercise per Session: 30 min  Stress: No Stress Concern Present (11/06/2022)   [employer] of Occupational Health - Occupational Stress Questionnaire    Feeling of Stress : Not at all  Social Connections: Moderately Isolated (11/06/2022)   Social Connection and Isolation Panel [NHANES]    Frequency of Communication with Friends and Family: More than three times a week    Frequency of Social Gatherings with Friends and Family: Once a week    Attends Religious Services: Never    Database administrator or Organizations: No    Attends Banker Meetings: Never    Marital Status: Married  Catering manager Violence: Not At Risk (11/06/2022)   Humiliation, Afraid, Rape, and Kick questionnaire    Fear of Current or Ex-Partner: No    Emotionally Abused: No    Physically Abused: No    Sexually Abused: No    Family History:    Family History  Problem Relation  Age of Onset   Cancer Mother        intestinal   Early death Mother    Alcohol abuse Mother    Liver disease Father        ETOH   Alcohol abuse Father    Arthritis Father    Stroke Sister    Kidney disease Sister    Stroke Sister    Stroke Brother    Arthritis Paternal Grandmother    Early death Child      ROS:  Please see the history of present illness.   All other ROS reviewed and negative.     Physical Exam/Data:   Vitals:   01/10/23 0400 01/10/23 0430 01/10/23 0507 01/10/23 0511  BP: 101/80 103/67 (!) 115/99   Pulse:  100 99   Resp: (!) 21 17    Temp:    97.8 F (36.6 C)  TempSrc:    Oral  SpO2: 94% 92%    Weight:      Height:        Intake/Output Summary (Last 24 hours) at 01/10/2023 0928 Last data filed at 01/10/2023 0530 Gross per 24 hour  Intake 40 ml  Output 3750 ml  Net -3710 ml      01/09/2023    3:48 PM 01/09/2023    1:43 PM 12/12/2022   11:05 AM  Last 3 Weights  Weight (lbs) 168 lb 168 lb 9.6 oz 156 lb 3.2 oz  Weight (kg) 76.204 kg 76.476 kg 70.852 kg     Body mass index is 22.78 kg/m.  General:  Well nourished, well developed, in no acute distress HEENT: normal Neck: + JVD Vascular: No carotid bruits; Distal pulses 2+ bilaterally Cardiac:  irregular rhythm, regular rate Lungs:  clear in upper lobes, crackles in bases Abd: soft, nontender, no hepatomegaly  Ext: no edema Musculoskeletal:  No deformities, BUE and BLE strength normal and equal Skin: warm and dry  Neuro:  CNs 2-12 intact, no focal abnormalities noted Psych:  Normal affect   EKG:  The EKG was personally reviewed and demonstrates:  atrial fibrillation with VR 94, anterior Q waves Telemetry:  Telemetry was personally reviewed and demonstrates:  rate controlled Afib in the 80s  Relevant CV Studies:  Echo pending  Laboratory Data:  High Sensitivity Troponin:   Recent Labs  Lab 01/09/23 2018 01/09/23 2213  TROPONINIHS 20* 20*     Chemistry Recent Labs  Lab  01/09/23 1642 01/09/23 2018 01/10/23 0526  NA 137  --  137  K 4.1  --  3.7  CL 108  --  102  CO2 21*  --  23  GLUCOSE 102*  --  89  BUN 23  --  18  CREATININE 0.94  --  0.99  CALCIUM 8.3*  --  8.5*  MG  --  2.1  --   GFRNONAA >60  --  >60  ANIONGAP 8  --  12    No results for input(s): "PROT", "ALBUMIN", "AST", "ALT", "ALKPHOS", "BILITOT" in the last 168 hours. Lipids No results for input(s): "CHOL", "TRIG", "HDL", "LABVLDL", "LDLCALC", "CHOLHDL" in the last 168 hours.  Hematology Recent Labs  Lab 01/09/23 1642 01/10/23 0526  WBC 4.4 5.0  RBC 3.95* 4.22  HGB 12.5* 13.2  HCT 38.1* 39.6  MCV 96.5 93.8  MCH 31.6 31.3  MCHC 32.8 33.3  RDW 14.6 14.4  PLT 190 192   Thyroid  Recent Labs  Lab 01/09/23 2018  TSH 3.404    BNP Recent Labs  Lab 01/09/23 1642  BNP 1,095.4*    DDimer No results for input(s): "DDIMER" in the last 168 hours.   Radiology/Studies:  DG Chest 2 View  Result Date: 01/09/2023 CLINICAL DATA:  Shortness of breath, leg swelling EXAM: CHEST - 2 VIEW COMPARISON:  None Available. FINDINGS: Heart size is upper limits of normal. Aortic atherosclerosis. Pulmonary vascular congestion. Mild diffuse interstitial prominence with patchy right basilar opacity. Small bilateral pleural effusions. No pneumothorax. IMPRESSION: Findings suggestive of CHF with pulmonary edema and small bilateral pleural effusions. Electronically Signed   By: Davina Poke D.O.   On: 01/09/2023 17:04     Assessment and Plan:   Atrial fibrillation with RVR Hx of PAF - EKG with rate controlled AFib - telemetry with Afib in the 80s, no significant RVR - echo pending - TSH 3.4 - Mg 2.1, K  3.7 - currently being treated with scheduled lopressor 25 mg BID - depending on echo results, rhythm control may be his best strategy given PD and orthostatic hypotension - he has not yet ambulated on this dose of BB - rhythm control strategies may include amiodarone vs DCCV once euvolemic -  given that he is asymptomatic and rate controlled, will not attempt rhythm control at this time given he has not been anticoagulated - will need to collect orthostatic vitals and ambulate with PT on this dose of BB once more euvolemic   Need for anticoagulation This patients CHA2DS2-VASc Score and unadjusted Ischemic Stroke Rate (% per year) is equal to 3.2 % stroke rate/year from a score of 3 (CHF, 2age) Pt denies prior history of [place], but this may be because he does not like taking medication I discussed both eliquis and coumadin, he is concerned about cost, - his copay for eliquis will be $47   Acute congestive heart failure - leg edema to mid thighs - BNP 1100 - CXR with pulmonary edema - has received 20 mg IV lasix x 2 doses and 40 mg IV lasix when I was in  the room - he is diuresing very well with 3.7 L urine output so far - agree with 40 mg IV lasix BID - echo pending - GDMT may be difficult given propensity for orthostatic hypotension - will likely be a good candidate for SGLT2i  Chest pain - HST 20 --> 20 - EKG with anterior Q waves new since June 2023 tracing - chest pain describes as exertional with walking, relieved with rest - given new anterior Q waves, I discussed possible ischemic evaluation once more euvolemic - depending on echo results and rhythm control, this could be coronary CTA  or definitive angiography - he has fairly good functional capacity despite PD  Parkinson's disease Orthostatic hypotension Will need to be cautious with rate controlling medications as well as GDMT for CHF - management per primary  Risk Assessment/Risk Scores:   New York Heart Association (NYHA) Functional Class NYHA Class IV  CHA2DS2-VASc Score = 3   This indicates a 3.2% annual risk of stroke. The patient's score is based upon: CHF History: 1 HTN History: 0 Diabetes History: 0 Stroke History: 0 Vascular Disease History: 0 Age Score: 2 Gender Score: 0   For questions or  updates, please contact [place] Please consult [email or web address] for contact info under   Signed, Ledora Bottcher, PA  01/10/2023 9:28 AM As above, patient seen and examined.  Briefly he is an 82 year old male with past medical history of Parkinson's, Paget's, obstructive sleep apnea, benign prostatic hypertrophy, gastroesophageal reflux disease for evaluation of atrial fibrillation and new onset acute systolic congestive heart failure.  Patient states that for the past 6 to 8 weeks he has had progressive dyspnea on exertion and bilateral lower extremity edema.  He denies palpitations.  He also over the past 6 months notices chest discomfort in the substernal area with exertion relieved with rest.  No associated symptoms.  He was admitted and found to be in atrial fibrillation and cardiology asked to evaluate. Patient is volume overloaded on examination.  Systolic blood pressure is in the 90-100 range. Chest x-ray shows CHF.  BUN 18 and creatinine 0.99.  BNP 1095.  Troponins are 20 and 20.  Hemoglobin 13.2.  Electrocardiogram shows atrial fibrillation, cannot rule out septal infarct and anterior T wave inversion.  TSH is normal at 3.404.  1 persistent atrial fibrillation-patient is noted to be in atrial fibrillation which is new compared to June 2023.  This may be contributing to his CHF.  His blood pressure is borderline.  Hold metoprolol and follow heart rate.  If it increases significantly we will add amiodarone.  CHA2DS2-VASc is 3.  Will add IV heparin and transition to apixaban once all procedures complete.  I would favor TEE guided cardioversion following catheterization.  2 acute systolic congestive heart failure-preliminary echocardiogram shows severely reduced LV function with wall motion abnormalities.  Will await final results.  Continue Lasix 40 mg IV twice daily and add spironolactone 12.5 mg daily.  Add Farxiga 10 mg daily.  Follow renal function closely.  3 cardiomyopathy-LV  function appears to be reduced on initial echo images.  Will await final report.  There also appears to be distal septal and apical wall motion abnormality.  He also was complaining of chest pain with exertion relieved with rest.  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  Patient needs right and left cardiac catheterization.  Will arrange for Monday.  The risk and benefits including myocardial infarction, CVA and death discussed and he agrees to  proceed.  Blood pressure is limiting guideline directed medical therapy at this point.  Will add ARB and beta-blocker later if heart rate allows and once CHF improves.  Add aspirin 81 mg daily for probable coronary disease.  He is intolerant to statins.  4 Parkinson's-patient has some instability with ambulation but does not fall.  Will have to follow closely with addition of anticoagulation.  Kirk Ruths, MD

## 2023-01-10 NOTE — Progress Notes (Signed)
ANTICOAGULATION CONSULT NOTE - Initial Consult  Pharmacy Consult for heparin Indication: atrial fibrillation  Allergies  Allergen Reactions   Statins Other (See Comments)    Weakness/myalgia    Other Rash    Had rash on arm after pneumonia and flu vaccine together - never had either separately prior to this reaction.  Zquil - "felt like his skin was crawling"     Patient Measurements: Height: 6\' 1"  (185.4 cm) Weight: 70.5 kg (155 lb 6.8 oz) IBW/kg (Calculated) : 79.9 HEPARIN DW (KG): 70.5   Vital Signs: Temp: 97.7 F (36.5 C) (01/12 1936) Temp Source: Oral (01/12 1936) BP: 99/80 (01/12 1936) Pulse Rate: 40 (01/12 1704)  Labs: Recent Labs    01/09/23 1642 01/09/23 2018 01/09/23 2213 01/10/23 0526 01/10/23 2053  HGB 12.5*  --   --  13.2  --   HCT 38.1*  --   --  39.6  --   PLT 190  --   --  192  --   HEPARINUNFRC  --   --   --   --  0.24*  CREATININE 0.94  --   --  0.99  --   TROPONINIHS  --  20* 20*  --   --      Estimated Creatinine Clearance: 57.4 mL/min (by C-G formula based on SCr of 0.99 mg/dL).   Medical History: Past Medical History:  Diagnosis Date   BPH with obstruction/lower urinary tract symptoms    orthostatic hypotension on flomax   Chronic hoarseness    Eczema    Legs.  Triamcinolone   GERD (gastroesophageal reflux disease)    OSA (obstructive sleep apnea)    quit cpap in 2017   Osteoarthritis, multiple sites    Paget's disease of bone    Zoledronic acid 2002 and again in 2012.  Monitoring alkaline phosphatase levels.   Parkinson's disease    Diagnosed 2018    Assessment: Patient presents to ED after visit with PCP showed afib with RVR. Patient reports having afib before but he is not on [facility] PTA. Per cardiology plan for TEE guided cardioversion following cath. Cath scheduled for 1/15. On admit, patient started on enoxaparin for afib but has been discontinued. Last dose 1/12 0511. Pharmacy consulted to dose heparin.   CBC wnl. Will forgo  bolus given enoxaparin dose this AM. Heparin to start 4 hours before next dose of enoxaparin due.   Heparin level came back subtherapeutic tonight. We will increase the rate and check level in AM.   Goal of Therapy:  Heparin level 0.3-0.7 units/ml Monitor platelets by anticoagulation protocol: Yes   Plan:  Increase heparin infusion to 1250 units/hr Will f/u heparin level in AM Monitor daily heparin level and CBC Continue to monitor H&H     Onnie Boer, PharmD, BCIDP, AAHIVP, CPP Infectious Disease Pharmacist 01/10/2023 9:36 PM

## 2023-01-10 NOTE — Progress Notes (Signed)
ANTICOAGULATION CONSULT NOTE - Initial Consult  Pharmacy Consult for heparin Indication: atrial fibrillation  Allergies  Allergen Reactions   Statins Other (See Comments)    Weakness/myalgia    Other Rash    Had rash on arm after pneumonia and flu vaccine together - never had either separately prior to this reaction.  Zquil - "felt like his skin was crawling"     Patient Measurements: Height: 6' (182.9 cm) Weight: 76.2 kg (168 lb) IBW/kg (Calculated) : 77.6 HEPARIN DW (KG): 76.2   Vital Signs: Temp: 97.8 F (36.6 C) (01/12 0511) Temp Source: Oral (01/12 0511) BP: 113/94 (01/12 1000) Pulse Rate: 90 (01/12 1000)  Labs: Recent Labs    01/09/23 1642 01/09/23 2018 01/09/23 2213 01/10/23 0526  HGB 12.5*  --   --  13.2  HCT 38.1*  --   --  39.6  PLT 190  --   --  192  CREATININE 0.94  --   --  0.99  TROPONINIHS  --  20* 20*  --     Estimated Creatinine Clearance: 62 mL/min (by C-G formula based on SCr of 0.99 mg/dL).   Medical History: Past Medical History:  Diagnosis Date   BPH with obstruction/lower urinary tract symptoms    orthostatic hypotension on flomax   Chronic hoarseness    Eczema    Legs.  Triamcinolone   GERD (gastroesophageal reflux disease)    OSA (obstructive sleep apnea)    quit cpap in 2017   Osteoarthritis, multiple sites    Paget's disease of bone    Zoledronic acid 2002 and again in 2012.  Monitoring alkaline phosphatase levels.   Parkinson's disease    Diagnosed 2018    Assessment: Patient presents to ED after visit with PCP showed afib with RVR. Patient reports having afib before but he is not on [facility] PTA. Per cardiology plan for TEE guided cardioversion following cath. Cath scheduled for 1/15. On admit, patient started on enoxaparin for afib but has been discontinued. Last dose 1/12 0511. Pharmacy consulted to dose heparin.   CBC wnl. Will forgo bolus given enoxaparin dose this AM. Heparin to start 4 hours before next dose of enoxaparin  due.   Goal of Therapy:  Heparin level 0.3-0.7 units/ml Monitor platelets by anticoagulation protocol: Yes   Plan:  Start heparin infusion at 1100 units/hr today at 1300  Will f/u heparin level in 8 hours Monitor daily heparin level and CBC Continue to monitor H&H     Gena Fray, PharmD PGY1 Pharmacy Resident   01/10/2023 11:19 AM

## 2023-01-10 NOTE — Progress Notes (Signed)
PROGRESS NOTE    Mario George  GUY:[id number] DOB: June 25, 1941 DOA: 01/09/2023 PCP: Tammi Sou, MD  Chief Complaint  Patient presents with   Leg Swelling    Brief Narrative:   82 year old male with past medical history of paget's disease, essential tremors, PACs, BPH.  Around 6 weeks ago he noted he had been developing swollen ankles.  Not long after, he became quite ill with by respiratory type symptoms.  He had [name] bad cough and cold, it took him 3 weeks to get over it, it took [name] lot of energy out of him.  After this he noted that he is ankles are now swollen up to his thigh.  He went to see his PCP, EKG done in the office showed atrial fibrillation.  It was suggested he go to the ER. Per patient he has chest pain but describes this as [name] GERD type chest pains.  It is worse when he is out walking the dogs in the cold.  Chest pain resolved with cessation of his activity.  He denies any palpitation or lightheadedness   In the ER, patient diagnosed with atrial fibrillation not in RVR.  Heart rate however in the low 100s.  Chest x-ray positive for CHF, these are new diagnosis   Assessment & Plan:   Principal Problem:   Paroxysmal atrial fibrillation (HCC) Active Problems:   Parkinson's disease   Sensorineural hearing loss (SNHL) of both ears   Acute congestive heart failure (HCC)   Paget disease of bone   OSA (obstructive sleep apnea)  Acute Heart Failure Exacerbation CXR with pulm edema and small bilateral effusions EF unknown, echo pending Continue BID lasix for now Cardiology c/s, appreciate recs Strict I/O, daily weights  Atrial Fibrillation  TSH wnl, echo pending Metoprolol 25 mg BID started Chadsvasc 3 (age + HF) - was started on lovenox, will c/s TOC team for cost of eliquis/xarelto  Parkinson's Disease  Sinemet  Paget's Disease  Noted, outpatient f/u   BPH Not on any home meds  GERD Not on any home meds    DVT prophylaxis: therapeutic lovenox Code Status:  full Family Communication: none Disposition:   Status is: Inpatient Remains inpatient appropriate because: awaiting completion of HF w/u, continued diuresis   Consultants:  cardiology  Procedures:  none  Antimicrobials:  Anti-infectives (From admission, onward)    None       Subjective: Feels better than he has in [name] few weeks  Objective: Vitals:   01/10/23 0400 01/10/23 0430 01/10/23 0507 01/10/23 0511  BP: 101/80 103/67 (!) 115/99   Pulse:  100 99   Resp: (!) 21 17    Temp:    97.8 F (36.6 C)  TempSrc:    Oral  SpO2: 94% 92%    Weight:      Height:        Intake/Output Summary (Last 24 hours) at 01/10/2023 0850 Last data filed at 01/10/2023 0530 Gross per 24 hour  Intake 40 ml  Output 3750 ml  Net -3710 ml   Filed Weights   01/09/23 1548  Weight: 76.2 kg    Examination:  General exam: Appears calm and comfortable  Respiratory system: Clear to auscultation. Respiratory effort normal. Cardiovascular system: irregularly irregular Gastrointestinal system: Abdomen is nondistended, soft and nontender. Central nervous system: Alert and oriented. No focal neurological deficits. Extremities: bilateral 1 + LE edema   Data Reviewed: I have personally reviewed following labs and imaging studies  CBC: Recent Labs  Lab  01/09/23 1642 01/10/23 0526  WBC 4.4 5.0  NEUTROABS 2.7 3.2  HGB 12.5* 13.2  HCT 38.1* 39.6  MCV 96.5 93.8  PLT 190 192    Basic Metabolic Panel: Recent Labs  Lab 01/09/23 1642 01/09/23 2018 01/10/23 0526  NA 137  --  137  K 4.1  --  3.7  CL 108  --  102  CO2 21*  --  23  GLUCOSE 102*  --  89  BUN 23  --  18  CREATININE 0.94  --  0.99  CALCIUM 8.3*  --  8.5*  MG  --  2.1  --     GFR: Estimated Creatinine Clearance: 62 mL/min (by C-G formula based on SCr of 0.99 mg/dL).  Liver Function Tests: No results for input(s): "AST", "ALT", "ALKPHOS", "BILITOT", "PROT", "ALBUMIN" in the last 168 hours.  CBG: No results for  input(s): "GLUCAP" in the last 168 hours.   Recent Results (from the past 240 hour(s))  Resp panel by RT-PCR (RSV, Flu Binnie Vonderhaar&B, Covid) Anterior Nasal Swab     Status: None   Collection Time: 01/09/23  8:26 PM   Specimen: Anterior Nasal Swab  Result Value Ref Range Status   SARS Coronavirus 2 by RT PCR NEGATIVE NEGATIVE Final    Comment: (NOTE) SARS-CoV-2 target nucleic acids are NOT DETECTED.  The SARS-CoV-2 RNA is generally detectable in upper respiratory specimens during the acute phase of infection. The lowest concentration of SARS-CoV-2 viral copies this assay can detect is 138 copies/mL. [name] negative result does not preclude SARS-Cov-2 infection and should not be used as the sole basis for treatment or other patient management decisions. [name] negative result may occur with  improper specimen collection/handling, submission of specimen other than nasopharyngeal swab, presence of viral mutation(s) within the areas targeted by this assay, and inadequate number of viral copies(<138 copies/mL). [name] negative result must be combined with clinical observations, patient history, and epidemiological information. The expected result is Negative.  Fact Sheet for Patients:  BloggerCourse.com  Fact Sheet for Healthcare Providers:  SeriousBroker.it  This test is no t yet approved or cleared by the [place] FDA and  has been authorized for detection and/or diagnosis of SARS-CoV-2 by FDA under an Emergency Use Authorization (EUA). This EUA will remain  in effect (meaning this test can be used) for the duration of the COVID-19 declaration under Section 564(b)(1) of the Act, 21 U.S.C.section 360bbb-3(b)(1), unless the authorization is terminated  or revoked sooner.       Influenza [name] by PCR NEGATIVE NEGATIVE Final   Influenza B by PCR NEGATIVE NEGATIVE Final    Comment: (NOTE) The Xpert Xpress SARS-CoV-2/FLU/RSV plus assay is intended as an  aid in the diagnosis of influenza from Nasopharyngeal swab specimens and should not be used as [name] sole basis for treatment. Nasal washings and aspirates are unacceptable for Xpert Xpress SARS-CoV-2/FLU/RSV testing.  Fact Sheet for Patients: BloggerCourse.com  Fact Sheet for Healthcare Providers: SeriousBroker.it  This test is not yet approved or cleared by the [place] FDA and has been authorized for detection and/or diagnosis of SARS-CoV-2 by FDA under an Emergency Use Authorization (EUA). This EUA will remain in effect (meaning this test can be used) for the duration of the COVID-19 declaration under Section 564(b)(1) of the Act, 21 U.S.C. section 360bbb-3(b)(1), unless the authorization is terminated or revoked.     Resp Syncytial Virus by PCR NEGATIVE NEGATIVE Final    Comment: (NOTE) Fact Sheet for Patients: BloggerCourse.com  Fact  Sheet for Healthcare Providers: IncredibleEmployment.be  This test is not yet approved or cleared by the [place] and has been authorized for detection and/or diagnosis of SARS-CoV-2 by FDA under an Emergency Use Authorization (EUA). This EUA will remain in effect (meaning this test can be used) for the duration of the COVID-19 declaration under Section 564(b)(1) of the Act, 21 U.S.C. section 360bbb-3(b)(1), unless the authorization is terminated or revoked.  Performed at [facility], [place] [address] [place], [place] [address]          Radiology Studies: DG Chest 2 View  Result Date: 01/09/2023 CLINICAL DATA:  Shortness of breath, leg swelling EXAM: CHEST - 2 VIEW COMPARISON:  None Available. FINDINGS: Heart size is upper limits of normal. Aortic atherosclerosis. Pulmonary vascular congestion. Mild diffuse interstitial prominence with patchy right basilar opacity. Small bilateral pleural effusions. No pneumothorax. IMPRESSION:  Findings suggestive of CHF with pulmonary edema and small bilateral pleural effusions. Electronically Signed   By: Davina Poke D.O.   On: 01/09/2023 17:04        Scheduled Meds:  carbidopa-levodopa  1 tablet Oral QHS   carbidopa-levodopa  2 tablet Oral QID   enoxaparin (LOVENOX) injection  1 mg/kg Subcutaneous BID   furosemide  40 mg Intravenous Q12H   metoprolol tartrate  25 mg Oral BID   multivitamin with minerals  1 tablet Oral Daily   sodium chloride flush  3 mL Intravenous Q12H   Continuous Infusions:  sodium chloride       LOS: 1 day    Time spent: over 30 min    Fayrene Helper, MD [facility]   To contact the attending provider between 7A-7P or the covering provider during after hours 7P-7A, please log into the web site [email or web address] and access using universal [place] password for that web site. If you do not have the password, please call the hospital operator.  01/10/2023, 8:50 AM

## 2023-01-10 NOTE — TOC Benefit Eligibility Note (Addendum)
Patient Teacher, English as a foreign language completed.    The patient is currently admitted and upon discharge could be taking Eliquis 5 mg.  The current 30 day co-pay is $47.00.   The patient is currently admitted and upon discharge could be taking Xarelto 20 mg.  The current 30 day co-pay is $47.00.   The patient is currently admitted and upon discharge could be taking Farxiga 10 mg.  The current 30 day co-pay is $47.00.   The patient is insured through [place], [place] Patient Advocate Specialist [place] Patient Advocate Team Direct Number: [phone]  Fax: [phone]

## 2023-01-11 DIAGNOSIS — I4819 Other persistent atrial fibrillation: Secondary | ICD-10-CM | POA: Diagnosis not present

## 2023-01-11 DIAGNOSIS — I480 Paroxysmal atrial fibrillation: Secondary | ICD-10-CM | POA: Diagnosis not present

## 2023-01-11 LAB — CBC
HCT: 42.5 % (ref 39.0–52.0)
Hemoglobin: 14.4 g/dL (ref 13.0–17.0)
MCH: 31.9 pg (ref 26.0–34.0)
MCHC: 33.9 g/dL (ref 30.0–36.0)
MCV: 94 fL (ref 80.0–100.0)
Platelets: 179 10*3/uL (ref 150–400)
RBC: 4.52 MIL/uL (ref 4.22–5.81)
RDW: 14.3 % (ref 11.5–15.5)
WBC: 5.6 10*3/uL (ref 4.0–10.5)
nRBC: 0 % (ref 0.0–0.2)

## 2023-01-11 LAB — BASIC METABOLIC PANEL
Anion gap: 11 (ref 5–15)
BUN: 25 mg/dL — ABNORMAL HIGH (ref 8–23)
CO2: 24 mmol/L (ref 22–32)
Calcium: 8.4 mg/dL — ABNORMAL LOW (ref 8.9–10.3)
Chloride: 103 mmol/L (ref 98–111)
Creatinine, Ser: 1.11 mg/dL (ref 0.61–1.24)
GFR, Estimated: >60 mL/min (ref >60)
Glucose, Bld: 102 mg/dL — ABNORMAL HIGH (ref 70–99)
Potassium: 4 mmol/L (ref 3.5–5.1)
Sodium: 138 mmol/L (ref 135–145)

## 2023-01-11 LAB — HEPARIN LEVEL (UNFRACTIONATED)
Heparin Unfractionated: 0.43 IU/mL (ref 0.30–0.70)
Heparin Unfractionated: 0.36 IU/mL (ref 0.30–0.70)

## 2023-01-11 MED ORDER — PSYLLIUM 95 % PO PACK
1.0000 | PACK | Freq: Every day | ORAL | Status: DC
  Administered 2023-01-11 – 2023-01-14 (×4): 1 via ORAL
  Filled 2023-01-11 (×4): qty 1

## 2023-01-11 MED ORDER — MAGNESIUM HYDROXIDE 400 MG/5ML PO SUSP: 30.0000 mL | Freq: Every day | ORAL | Status: DC | PRN

## 2023-01-11 MED ORDER — FUROSEMIDE 10 MG/ML IJ SOLN
40.0000 mg | Freq: Every day | INTRAMUSCULAR | Status: DC
  Administered 2023-01-12: 40 mg via INTRAVENOUS
  Filled 2023-01-11: qty 4

## 2023-01-11 MED ORDER — AMIODARONE HCL 200 MG PO TABS
400.0000 mg | ORAL_TABLET | Freq: Two times a day (BID) | ORAL | Status: DC
  Administered 2023-01-11 – 2023-01-13 (×5): 400 mg via ORAL
  Filled 2023-01-11 (×5): qty 2

## 2023-01-11 NOTE — Progress Notes (Signed)
[place] for heparin Indication: atrial fibrillation  Allergies  Allergen Reactions   Statins Other (See Comments)    Weakness/myalgia    Other Rash    Had rash on arm after pneumonia and flu vaccine together - never had either separately prior to this reaction.  Zquil - "felt like his skin was crawling"     Patient Measurements: Height: 6\' 1"  (185.4 cm) Weight: 69 kg (152 lb 1.9 oz) IBW/kg (Calculated) : 79.9 HEPARIN DW (KG): 70.5   Vital Signs: Temp: 97.4 F (36.3 C) (01/13 1130) Temp Source: Oral (01/13 1130) BP: 91/61 (01/13 1130) Pulse Rate: 64 (01/13 1130)  Labs: Recent Labs    01/09/23 1642 01/09/23 2018 01/09/23 2213 01/10/23 0526 01/10/23 2053 01/11/23 0530 01/11/23 1426  HGB 12.5*  --   --  13.2  --  14.4  --   HCT 38.1*  --   --  39.6  --  42.5  --   PLT 190  --   --  192  --  179  --   HEPARINUNFRC  --   --   --   --  0.24* 0.43 0.36  CREATININE 0.94  --   --  0.99  --  1.11  --   TROPONINIHS  --  20* 20*  --   --   --   --      Estimated Creatinine Clearance: 50.1 mL/min (by C-G formula based on SCr of 1.11 mg/dL).   Medical History: Past Medical History:  Diagnosis Date   BPH with obstruction/lower urinary tract symptoms    orthostatic hypotension on flomax   Chronic hoarseness    Eczema    Legs.  Triamcinolone   GERD (gastroesophageal reflux disease)    OSA (obstructive sleep apnea)    quit cpap in 2017   Osteoarthritis, multiple sites    Paget's disease of bone    Zoledronic acid 2002 and again in 2012.  Monitoring alkaline phosphatase levels.   Parkinson's disease    Diagnosed 2018    Assessment: Patient presents to ED after visit with PCP showed afib with RVR. Patient reports having afib before but he is not on [facility] PTA. Per cardiology plan for TEE guided cardioversion following cath. Cath scheduled for 1/15. On admit, patient started on enoxaparin for afib but has been discontinued. Last dose  1/12 0511. Pharmacy consulted to dose heparin.   Heparin level 0.36 on drip rate 1250 units/hr therapeutic. Heparin continues to be therapeutic. Hgb 14.4 and plt 179. No s/sx bleeding noted in chart.     Goal of Therapy:  Heparin level 0.3-0.7 units/ml Monitor platelets by anticoagulation protocol: Yes   Plan:  Continue heparin infusion at 1250 units/hr  Monitor daily heparin level and CBC Continue to monitor H&H    Gena Fray, PharmD PGY1 Pharmacy Resident   01/11/2023 3:07 PM

## 2023-01-11 NOTE — Progress Notes (Signed)
Rounding Note    Patient Name: Mario George Date of Encounter: 01/11/2023  [facility] Cardiologist: Kirk Ruths, MD   Subjective   No CP or dyspnea  Inpatient Medications    Scheduled Meds:  aspirin  81 mg Oral Daily   carbidopa-levodopa  1 tablet Oral QHS   carbidopa-levodopa  2 tablet Oral QID   dapagliflozin propanediol  10 mg Oral Daily   furosemide  40 mg Intravenous BID   multivitamin with minerals  1 tablet Oral Daily   sodium chloride flush  3 mL Intravenous Q12H   sodium chloride flush  3 mL Intravenous Q12H   spironolactone  12.5 mg Oral Daily   Continuous Infusions:  sodium chloride     heparin 1,250 Units/hr (01/10/23 2235)   PRN Meds: sodium chloride, acetaminophen, magnesium hydroxide, ondansetron (ZOFRAN) IV, sodium chloride flush   Vital Signs    Vitals:   01/10/23 1704 01/10/23 1936 01/11/23 0027 01/11/23 0755  BP: 110/82 99/80 103/75 (!) 90/56  Pulse: (!) 40   78  Resp: 18 16  16   Temp: (!) 97.5 F (36.4 C) 97.7 F (36.5 C) 98.1 F (36.7 C) 97.7 F (36.5 C)  TempSrc: Oral Oral Oral Oral  SpO2: 95%   95%  Weight: 70.5 kg  69 kg   Height: 6\' 1"  (1.854 m)       Intake/Output Summary (Last 24 hours) at 01/11/2023 0824 Last data filed at 01/11/2023 0500 Gross per 24 hour  Intake 383.75 ml  Output 950 ml  Net -566.25 ml      01/11/2023   12:27 AM 01/10/2023    5:04 PM 01/09/2023    3:48 PM  Last 3 Weights  Weight (lbs) 152 lb 1.9 oz 155 lb 6.8 oz 168 lb  Weight (kg) 69 kg 70.5 kg 76.204 kg      Telemetry    Atrial fibrillation - Personally Reviewed   Physical Exam   GEN: No acute distress.   Neck: No JVD Cardiac: irregular Respiratory: Clear to auscultation bilaterally. GI: Soft, nontender, non-distended  MS: trace edema Neuro:  Nonfocal  Psych: Normal affect   Labs    High Sensitivity Troponin:   Recent Labs  Lab 01/09/23 2018 01/09/23 2213  TROPONINIHS 20* 20*     Chemistry Recent Labs  Lab  01/09/23 1642 01/09/23 2018 01/10/23 0526 01/11/23 0530  NA 137  --  137 138  K 4.1  --  3.7 4.0  CL 108  --  102 103  CO2 21*  --  23 24  GLUCOSE 102*  --  89 102*  BUN 23  --  18 25*  CREATININE 0.94  --  0.99 1.11  CALCIUM 8.3*  --  8.5* 8.4*  MG  --  2.1  --   --   GFRNONAA >60  --  >60 >60  ANIONGAP 8  --  12 11    Hematology Recent Labs  Lab 01/09/23 1642 01/10/23 0526 01/11/23 0530  WBC 4.4 5.0 5.6  RBC 3.95* 4.22 4.52  HGB 12.5* 13.2 14.4  HCT 38.1* 39.6 42.5  MCV 96.5 93.8 94.0  MCH 31.6 31.3 31.9  MCHC 32.8 33.3 33.9  RDW 14.6 14.4 14.3  PLT 190 192 179   Thyroid  Recent Labs  Lab 01/09/23 2018  TSH 3.404    BNP Recent Labs  Lab 01/09/23 1642  BNP 1,095.4*     Radiology    ECHOCARDIOGRAM COMPLETE  Result Date: 01/10/2023  ECHOCARDIOGRAM REPORT   Patient Name:   Mario George Date of Exam: 01/10/2023 Medical Rec #:  [id number]   Height:       72.0 in Accession #:    [id number]  Weight:       168.0 lb Date of Birth:  Jun 06, 1941    BSA:          1.978 m Patient Age:    82 years    BP:           113/94 mmHg Patient Gender: M           HR:           90 bpm. Exam Location:  Inpatient Procedure: 2D Echo, Cardiac Doppler and Color Doppler Indications:    CHF-Acute Systolic Y69.48  History:        Patient has no prior history of Echocardiogram examinations.                 Risk Factors:Sleep Apnea. Parkinson's disease. Paget's disease.  Sonographer:    Darlina Sicilian RDCS Referring Phys: [place]  1. Left ventricular ejection fraction, by estimation, is 25 to 30%. The left ventricle has severely decreased function. The left ventricle demonstrates regional wall motion abnormalities (see scoring diagram/findings for description). Left ventricular diastolic function could not be evaluated.  2. Right ventricular systolic function is low normal. The right ventricular size is normal. There is normal pulmonary artery systolic pressure. The estimated  right ventricular systolic pressure is 54.6 mmHg.  3. Right atrial size was mildly dilated.  4. The mitral valve is grossly normal. Mild mitral valve regurgitation. No evidence of mitral stenosis.  5. The aortic valve is tricuspid. Aortic valve regurgitation is not visualized. No aortic stenosis is present.  6. The inferior vena cava is normal in size with <50% respiratory variability, suggesting right atrial pressure of 8 mmHg. Conclusion(s)/Recommendation(s): Findings consistent with ischemic cardiomyopathy. FINDINGS  Left Ventricle: Left ventricular ejection fraction, by estimation, is 25 to 30%. The left ventricle has severely decreased function. The left ventricle demonstrates regional wall motion abnormalities. The left ventricular internal cavity size was normal  in size. There is no left ventricular hypertrophy. Left ventricular diastolic function could not be evaluated due to atrial fibrillation. Left ventricular diastolic function could not be evaluated.  LV Wall Scoring: The mid and distal anterior septum, mid inferoseptal segment, apical anterior segment, and apex are akinetic. Right Ventricle: The right ventricular size is normal. No increase in right ventricular wall thickness. Right ventricular systolic function is low normal. There is normal pulmonary artery systolic pressure. The tricuspid regurgitant velocity is 1.99 m/s,  and with an assumed right atrial pressure of 8 mmHg, the estimated right ventricular systolic pressure is 27.0 mmHg. Left Atrium: Left atrial size was normal in size. Right Atrium: Right atrial size was mildly dilated. Pericardium: There is no evidence of pericardial effusion. Mitral Valve: The mitral valve is grossly normal. Mild mitral valve regurgitation. No evidence of mitral valve stenosis. Tricuspid Valve: The tricuspid valve is grossly normal. Tricuspid valve regurgitation is mild . No evidence of tricuspid stenosis. Aortic Valve: The aortic valve is tricuspid. Aortic  valve regurgitation is not visualized. No aortic stenosis is present. Pulmonic Valve: The pulmonic valve was not well visualized. Pulmonic valve regurgitation is not visualized. No evidence of pulmonic stenosis. Aorta: The aortic root is normal in size and structure. Venous: The inferior vena cava is normal in size with less than 50% respiratory variability, suggesting  right atrial pressure of 8 mmHg. IAS/Shunts: No atrial level shunt detected by color flow Doppler. Additional Comments: There is pleural effusion in both left and right lateral regions.  LEFT VENTRICLE PLAX 2D LVIDd:         4.30 cm      Diastology LVIDs:         3.40 cm      LV e' medial:    6.63 cm/s LV PW:         0.90 cm      LV E/e' medial:  9.8 LV IVS:        0.90 cm      LV e' lateral:   9.52 cm/s LVOT diam:     1.70 cm      LV E/e' lateral: 6.8 LV SV:         18 LV SV Index:   9 LVOT Area:     2.27 cm  LV Volumes (MOD) LV vol d, MOD A2C: 115.0 ml LV vol d, MOD A4C: 92.3 ml LV vol s, MOD A2C: 72.4 ml LV vol s, MOD A4C: 67.3 ml LV SV MOD A2C:     42.6 ml LV SV MOD A4C:     92.3 ml LV SV MOD BP:      34.8 ml RIGHT VENTRICLE TAPSE (M-mode): 1.9 cm LEFT ATRIUM             Index        RIGHT ATRIUM           Index LA diam:        4.10 cm 2.07 cm/m   RA Area:     19.70 cm LA Vol (A2C):   69.3 ml 35.03 ml/m  RA Volume:   54.40 ml  27.50 ml/m LA Vol (A4C):   54.1 ml 27.35 ml/m LA Biplane Vol: 61.5 ml 31.09 ml/m  AORTIC VALVE LVOT Vmax:   55.80 cm/s LVOT Vmean:  39.400 cm/s LVOT VTI:    0.080 m  AORTA Ao Root diam: 3.10 cm MITRAL VALVE               TRICUSPID VALVE MV Area (PHT): 8.37 cm    TR Peak grad:   15.8 mmHg MV Decel Time: 91 msec     TR Vmax:        199.00 cm/s MR Peak grad: 66.3 mmHg MR Mean grad: 42.0 mmHg    SHUNTS MR Vmax:      407.00 cm/s  Systemic VTI:  0.08 m MR Vmean:     309.0 cm/s   Systemic Diam: 1.70 cm MV E velocity: 64.70 cm/s Lennie Odor MD Electronically signed by Lennie Odor MD Signature Date/Time: 01/10/2023/1:27:03  PM    Final    DG Chest 2 View  Result Date: 01/09/2023 CLINICAL DATA:  Shortness of breath, leg swelling EXAM: CHEST - 2 VIEW COMPARISON:  None Available. FINDINGS: Heart size is upper limits of normal. Aortic atherosclerosis. Pulmonary vascular congestion. Mild diffuse interstitial prominence with patchy right basilar opacity. Small bilateral pleural effusions. No pneumothorax. IMPRESSION: Findings suggestive of CHF with pulmonary edema and small bilateral pleural effusions. Electronically Signed   By: Duanne Guess D.O.   On: 01/09/2023 17:04      Patient Profile      83 year old male with past medical history of Parkinson's, Paget's, obstructive sleep apnea, benign prostatic hypertrophy, gastroesophageal reflux disease for evaluation of atrial fibrillation and new onset acute systolic congestive heart failure.  Echocardiogram this  admission shows ejection fraction 25 to 30% with akinesis of the distal septum, apex and distal inferior wall, mild right atrial enlargement, mild mitral regurgitation.   Assessment & Plan    1 persistent atrial fibrillation-patient remains in atrial fibrillation this morning and this is new compared to June 2023.  This is felt to be contributing to his congestive heart failure.  Continue IV heparin and transition to apixaban once all procedures complete.  Plan is for TEE guided cardioversion prior to discharge.  Given severe LV dysfunction he will likely require antiarrhythmic to maintain sinus rhythm.  Will begin amiodarone both for rate control and to help maintain sinus rhythm following cardioversion.  Begin 400 mg twice daily for 1 week then 200 mg daily thereafter.     2 acute systolic congestive heart failure-echo shows severe LV dysfunction.  Volume status has improved.  Decrease Lasix to 40 mg daily and continue spironolactone and Farxiga at present dose.  Continue to follow renal function.     3 cardiomyopathy-LV function severely reduced and there wall  motion abnormalities.  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  Plan is for cardiac catheterization as outlined.  I am unable to add guideline directed medical therapy at this point as his blood pressure is borderline.  Will try and add low-dose beta-blocker and ARB later as tolerated.  Continue aspirin for presumed coronary artery disease.  He is intolerant to statins.     4 Parkinson's-patient has some instability with ambulation but does not fall.  Will have to follow closely with addition of anticoagulation.  For questions or updates, please contact [facility] Please consult [email or web address] for contact info under        Signed, Olga Millers, MD  01/11/2023, 8:24 AM

## 2023-01-11 NOTE — Progress Notes (Signed)
[place] for heparin Indication: atrial fibrillation  Allergies  Allergen Reactions   Statins Other (See Comments)    Weakness/myalgia    Other Rash    Had rash on arm after pneumonia and flu vaccine together - never had either separately prior to this reaction.  Zquil - "felt like his skin was crawling"     Patient Measurements: Height: 6\' 1"  (185.4 cm) Weight: 69 kg (152 lb 1.9 oz) IBW/kg (Calculated) : 79.9 HEPARIN DW (KG): 70.5   Vital Signs: Temp: 98.1 F (36.7 C) (01/13 0027) Temp Source: Oral (01/13 0027) BP: 103/75 (01/13 0027)  Labs: Recent Labs    01/09/23 1642 01/09/23 2018 01/09/23 2213 01/10/23 0526 01/10/23 2053 01/11/23 0530  HGB 12.5*  --   --  13.2  --  14.4  HCT 38.1*  --   --  39.6  --  42.5  PLT 190  --   --  192  --  179  HEPARINUNFRC  --   --   --   --  0.24* 0.43  CREATININE 0.94  --   --  0.99  --  1.11  TROPONINIHS  --  20* 20*  --   --   --      Estimated Creatinine Clearance: 50.1 mL/min (by C-G formula based on SCr of 1.11 mg/dL).   Medical History: Past Medical History:  Diagnosis Date   BPH with obstruction/lower urinary tract symptoms    orthostatic hypotension on flomax   Chronic hoarseness    Eczema    Legs.  Triamcinolone   GERD (gastroesophageal reflux disease)    OSA (obstructive sleep apnea)    quit cpap in 2017   Osteoarthritis, multiple sites    Paget's disease of bone    Zoledronic acid 2002 and again in 2012.  Monitoring alkaline phosphatase levels.   Parkinson's disease    Diagnosed 2018    Assessment: Patient presents to ED after visit with PCP showed afib with RVR. Patient reports having afib before but he is not on [facility] PTA. Per cardiology plan for TEE guided cardioversion following cath. Cath scheduled for 1/15. On admit, patient started on enoxaparin for afib but has been discontinued. Last dose 1/12 0511. Pharmacy consulted to dose heparin.   CBC wnl. Will forgo  bolus given enoxaparin dose this AM. Heparin to start 4 hours before next dose of enoxaparin due.   1/13 AM update:  Heparin level therapeutic after rate increase  Goal of Therapy:  Heparin level 0.3-0.7 units/ml Monitor platelets by anticoagulation protocol: Yes   Plan:  Cont heparin 1250 units/hr 1400 heparin level  Narda Bonds, PharmD, BCPS Clinical Pharmacist Phone: [phone]

## 2023-01-11 NOTE — Progress Notes (Addendum)
PROGRESS NOTE    Mario George  WUJ:[id number] DOB: 1941-11-05 DOA: 01/09/2023 PCP: Jeoffrey Massed, MD  Chief Complaint  Patient presents with   Leg Swelling    Brief Narrative:   82 year old male with past medical history of paget's disease, essential tremors, PACs, BPH.  Around 6 weeks ago he noted he had been developing swollen ankles.  Not long after, he became quite ill with by respiratory type symptoms.  He had Taeshawn Helfman bad cough and cold, it took him 3 weeks to get over it, it took Makaelyn Aponte lot of energy out of him.  After this he noted that he is ankles are now swollen up to his thigh.  He went to see his PCP, EKG done in the office showed atrial fibrillation.  It was suggested he go to the ER. Per patient he has chest pain but describes this as Klaus Casteneda GERD type chest pains.  It is worse when he is out walking the dogs in the cold.  Chest pain resolved with cessation of his activity.  He denies any palpitation or lightheadedness   In the ER, patient diagnosed with atrial fibrillation not in RVR.  Heart rate however in the low 100s.  Chest x-ray positive for CHF, these are new diagnosis   Assessment & Plan:   Principal Problem:   Paroxysmal atrial fibrillation (HCC) Active Problems:   Parkinson's disease   Sensorineural hearing loss (SNHL) of both ears   Acute congestive heart failure (HCC)   Paget disease of bone   OSA (obstructive sleep apnea)  Acute Heart Failure Exacerbation CXR with pulm edema and small bilateral effusions EF 25-30%, RWMA, low normal RVSF - findings c/w ischemic cardiomyopathy Diuresis per cardiology, appreciate assistance Plan for cardiac cath Monday Spironolactone, lasix, farxiga - BP limiting GDMT Cardiology c/s, appreciate recs Strict I/O, daily weights  Atrial Fibrillation  TSH wnl, echo pending Beta blocker held.  Started on amiodarone. Chadsvasc 3 (age + HF) - heparin gtt  Parkinson's Disease  Sinemet  Paget's Disease  Noted, outpatient f/u   BPH Not  on any home meds  GERD Not on any home meds    DVT prophylaxis: therapeutic lovenox Code Status: full Family Communication: daughter Amy Disposition:   Status is: Inpatient Remains inpatient appropriate because: awaiting completion of HF w/u, continued diuresis   Consultants:  cardiology  Procedures:  none  Antimicrobials:  Anti-infectives (From admission, onward)    None       Subjective: Feels Mario George little better today  Objective: Vitals:   01/10/23 1704 01/10/23 1936 01/11/23 0027 01/11/23 0755  BP: 110/82 99/80 103/75 (!) 90/56  Pulse: (!) 40   78  Resp: 18 16  16   Temp: (!) 97.5 F (36.4 C) 97.7 F (36.5 C) 98.1 F (36.7 C) 97.7 F (36.5 C)  TempSrc: Oral Oral Oral Oral  SpO2: 95%   95%  Weight: 70.5 kg  69 kg   Height: 6\' 1"  (1.854 m)       Intake/Output Summary (Last 24 hours) at 01/11/2023 1057 Last data filed at 01/11/2023 0842 Gross per 24 hour  Intake 623.75 ml  Output 950 ml  Net -326.25 ml   Filed Weights   01/09/23 1548 01/10/23 1704 01/11/23 0027  Weight: 76.2 kg 70.5 kg 69 kg    Examination:  General: No acute distress. Sitting up in chair Cardiovascular: irregularly irregular Lungs: Clear to auscultation bilaterally Abdomen: Soft, nontender, nondistended Neurological: Alert and oriented 3. Moves all extremities Cranial nerves II  through XII grossly intact. Skin: Warm and dry. No rashes or lesions. Extremities: improved LE edema, trace    Data Reviewed: I have personally reviewed following labs and imaging studies  CBC: Recent Labs  Lab 01/09/23 1642 01/10/23 0526 01/11/23 0530  WBC 4.4 5.0 5.6  NEUTROABS 2.7 3.2  --   HGB 12.5* 13.2 14.4  HCT 38.1* 39.6 42.5  MCV 96.5 93.8 94.0  PLT 190 192 329    Basic Metabolic Panel: Recent Labs  Lab 01/09/23 1642 01/09/23 2018 01/10/23 0526 01/11/23 0530  NA 137  --  137 138  K 4.1  --  3.7 4.0  CL 108  --  102 103  CO2 21*  --  23 24  GLUCOSE 102*  --  89 102*  BUN  23  --  18 25*  CREATININE 0.94  --  0.99 1.11  CALCIUM 8.3*  --  8.5* 8.4*  MG  --  2.1  --   --     GFR: Estimated Creatinine Clearance: 50.1 mL/min (by C-G formula based on SCr of 1.11 mg/dL).  Liver Function Tests: No results for input(s): "AST", "ALT", "ALKPHOS", "BILITOT", "PROT", "ALBUMIN" in the last 168 hours.  CBG: No results for input(s): "GLUCAP" in the last 168 hours.   Recent Results (from the past 240 hour(s))  Resp panel by RT-PCR (RSV, Flu Cassia Fein&B, Covid) Anterior Nasal Swab     Status: None   Collection Time: 01/09/23  8:26 PM   Specimen: Anterior Nasal Swab  Result Value Ref Range Status   SARS Coronavirus 2 by RT PCR NEGATIVE NEGATIVE Final    Comment: (NOTE) SARS-CoV-2 target nucleic acids are NOT DETECTED.  The SARS-CoV-2 RNA is generally detectable in upper respiratory specimens during the acute phase of infection. The lowest concentration of SARS-CoV-2 viral copies this assay can detect is 138 copies/mL. Verlon Pischke negative result does not preclude SARS-Cov-2 infection and should not be used as the sole basis for treatment or other patient management decisions. Adrie Picking negative result may occur with  improper specimen collection/handling, submission of specimen other than nasopharyngeal swab, presence of viral mutation(s) within the areas targeted by this assay, and inadequate number of viral copies(<138 copies/mL). Joson Sapp negative result must be combined with clinical observations, patient history, and epidemiological information. The expected result is Negative.  Fact Sheet for Patients:  EntrepreneurPulse.com.au  Fact Sheet for Healthcare Providers:  IncredibleEmployment.be  This test is no t yet approved or cleared by the [place] FDA and  has been authorized for detection and/or diagnosis of SARS-CoV-2 by FDA under an Emergency Use Authorization (EUA). This EUA will remain  in effect (meaning this test can be used) for the  duration of the COVID-19 declaration under Section 564(b)(1) of the Act, 21 U.S.C.section 360bbb-3(b)(1), unless the authorization is terminated  or revoked sooner.       Influenza [place] by PCR NEGATIVE NEGATIVE Final   Influenza B by PCR NEGATIVE NEGATIVE Final    Comment: (NOTE) The Xpert Xpress SARS-CoV-2/FLU/RSV plus assay is intended as an aid in the diagnosis of influenza from Nasopharyngeal swab specimens and should not be used as Tanis Hensarling sole basis for treatment. Nasal washings and aspirates are unacceptable for Xpert Xpress SARS-CoV-2/FLU/RSV testing.  Fact Sheet for Patients: EntrepreneurPulse.com.au  Fact Sheet for Healthcare Providers: IncredibleEmployment.be  This test is not yet approved or cleared by the [place] FDA and has been authorized for detection and/or diagnosis of SARS-CoV-2 by FDA under an Emergency Use Authorization (EUA).  This EUA will remain in effect (meaning this test can be used) for the duration of the COVID-19 declaration under Section 564(b)(1) of the Act, 21 U.S.C. section 360bbb-3(b)(1), unless the authorization is terminated or revoked.     Resp Syncytial Virus by PCR NEGATIVE NEGATIVE Final    Comment: (NOTE) Fact Sheet for Patients: BloggerCourse.com  Fact Sheet for Healthcare Providers: SeriousBroker.it  This test is not yet approved or cleared by the [place] FDA and has been authorized for detection and/or diagnosis of SARS-CoV-2 by FDA under an Emergency Use Authorization (EUA). This EUA will remain in effect (meaning this test can be used) for the duration of the COVID-19 declaration under Section 564(b)(1) of the Act, 21 U.S.C. section 360bbb-3(b)(1), unless the authorization is terminated or revoked.  Performed at [facility], [address] [place], [place] [address]          Radiology Studies: ECHOCARDIOGRAM  COMPLETE  Result Date: 01/10/2023    ECHOCARDIOGRAM REPORT   Patient Name:   Mario George Date of Exam: 01/10/2023 Medical Rec #:  [id number]   Height:       72.0 in Accession #:    [id number]  Weight:       168.0 lb Date of Birth:  Sep 19, 1941    BSA:          1.978 m Patient Age:    82 years    BP:           113/94 mmHg Patient Gender: M           HR:           90 bpm. Exam Location:  Inpatient Procedure: 2D Echo, Cardiac Doppler and Color Doppler Indications:    CHF-Acute Systolic I50.21  History:        Patient has no prior history of Echocardiogram examinations.                 Risk Factors:Sleep Apnea. Parkinson's disease. Paget's disease.  Sonographer:    Leta Jungling RDCS Referring Phys: [phone] DEBBY CROSLEY IMPRESSIONS  1. Left ventricular ejection fraction, by estimation, is 25 to 30%. The left ventricle has severely decreased function. The left ventricle demonstrates regional wall motion abnormalities (see scoring diagram/findings for description). Left ventricular diastolic function could not be evaluated.  2. Right ventricular systolic function is low normal. The right ventricular size is normal. There is normal pulmonary artery systolic pressure. The estimated right ventricular systolic pressure is 23.8 mmHg.  3. Right atrial size was mildly dilated.  4. The mitral valve is grossly normal. Mild mitral valve regurgitation. No evidence of mitral stenosis.  5. The aortic valve is tricuspid. Aortic valve regurgitation is not visualized. No aortic stenosis is present.  6. The inferior vena cava is normal in size with <50% respiratory variability, suggesting right atrial pressure of 8 mmHg. Conclusion(s)/Recommendation(s): Findings consistent with ischemic cardiomyopathy. FINDINGS  Left Ventricle: Left ventricular ejection fraction, by estimation, is 25 to 30%. The left ventricle has severely decreased function. The left ventricle demonstrates regional wall motion abnormalities. The left ventricular internal  cavity size was normal  in size. There is no left ventricular hypertrophy. Left ventricular diastolic function could not be evaluated due to atrial fibrillation. Left ventricular diastolic function could not be evaluated.  LV Wall Scoring: The mid and distal anterior septum, mid inferoseptal segment, apical anterior segment, and apex are akinetic. Right Ventricle: The right ventricular size is normal. No increase in right ventricular wall thickness.  Right ventricular systolic function is low normal. There is normal pulmonary artery systolic pressure. The tricuspid regurgitant velocity is 1.99 m/s,  and with an assumed right atrial pressure of 8 mmHg, the estimated right ventricular systolic pressure is 98.3 mmHg. Left Atrium: Left atrial size was normal in size. Right Atrium: Right atrial size was mildly dilated. Pericardium: There is no evidence of pericardial effusion. Mitral Valve: The mitral valve is grossly normal. Mild mitral valve regurgitation. No evidence of mitral valve stenosis. Tricuspid Valve: The tricuspid valve is grossly normal. Tricuspid valve regurgitation is mild . No evidence of tricuspid stenosis. Aortic Valve: The aortic valve is tricuspid. Aortic valve regurgitation is not visualized. No aortic stenosis is present. Pulmonic Valve: The pulmonic valve was not well visualized. Pulmonic valve regurgitation is not visualized. No evidence of pulmonic stenosis. Aorta: The aortic root is normal in size and structure. Venous: The inferior vena cava is normal in size with less than 50% respiratory variability, suggesting right atrial pressure of 8 mmHg. IAS/Shunts: No atrial level shunt detected by color flow Doppler. Additional Comments: There is pleural effusion in both left and right lateral regions.  LEFT VENTRICLE PLAX 2D LVIDd:         4.30 cm      Diastology LVIDs:         3.40 cm      LV e' medial:    6.63 cm/s LV PW:         0.90 cm      LV E/e' medial:  9.8 LV IVS:        0.90 cm      LV e'  lateral:   9.52 cm/s LVOT diam:     1.70 cm      LV E/e' lateral: 6.8 LV SV:         18 LV SV Index:   9 LVOT Area:     2.27 cm  LV Volumes (MOD) LV vol d, MOD A2C: 115.0 ml LV vol d, MOD A4C: 92.3 ml LV vol s, MOD A2C: 72.4 ml LV vol s, MOD A4C: 67.3 ml LV SV MOD A2C:     42.6 ml LV SV MOD A4C:     92.3 ml LV SV MOD BP:      34.8 ml RIGHT VENTRICLE TAPSE (M-mode): 1.9 cm LEFT ATRIUM             Index        RIGHT ATRIUM           Index LA diam:        4.10 cm 2.07 cm/m   RA Area:     19.70 cm LA Vol (A2C):   69.3 ml 35.03 ml/m  RA Volume:   54.40 ml  27.50 ml/m LA Vol (A4C):   54.1 ml 27.35 ml/m LA Biplane Vol: 61.5 ml 31.09 ml/m  AORTIC VALVE LVOT Vmax:   55.80 cm/s LVOT Vmean:  39.400 cm/s LVOT VTI:    0.080 m  AORTA Ao Root diam: 3.10 cm MITRAL VALVE               TRICUSPID VALVE MV Area (PHT): 8.37 cm    TR Peak grad:   15.8 mmHg MV Decel Time: 91 msec     TR Vmax:        199.00 cm/s MR Peak grad: 66.3 mmHg MR Mean grad: 42.0 mmHg    SHUNTS MR Vmax:      407.00 cm/s  Systemic VTI:  0.08 m MR Vmean:     309.0 cm/s   Systemic Diam: 1.70 cm MV E velocity: 64.70 cm/s Lennie Odor MD Electronically signed by Lennie Odor MD Signature Date/Time: 01/10/2023/1:27:03 PM    Final    DG Chest 2 View  Result Date: 01/09/2023 CLINICAL DATA:  Shortness of breath, leg swelling EXAM: CHEST - 2 VIEW COMPARISON:  None Available. FINDINGS: Heart size is upper limits of normal. Aortic atherosclerosis. Pulmonary vascular congestion. Mild diffuse interstitial prominence with patchy right basilar opacity. Small bilateral pleural effusions. No pneumothorax. IMPRESSION: Findings suggestive of CHF with pulmonary edema and small bilateral pleural effusions. Electronically Signed   By: Duanne Guess D.O.   On: 01/09/2023 17:04        Scheduled Meds:  amiodarone  400 mg Oral BID   aspirin  81 mg Oral Daily   carbidopa-levodopa  1 tablet Oral QHS   carbidopa-levodopa  2 tablet Oral QID   dapagliflozin propanediol   10 mg Oral Daily   [START ON 01/12/2023] furosemide  40 mg Intravenous Daily   multivitamin with minerals  1 tablet Oral Daily   psyllium  1 packet Oral Daily   sodium chloride flush  3 mL Intravenous Q12H   sodium chloride flush  3 mL Intravenous Q12H   spironolactone  12.5 mg Oral Daily   Continuous Infusions:  sodium chloride     heparin 1,250 Units/hr (01/11/23 0948)     LOS: 2 days    Time spent: over 30 min    Lacretia Nicks, MD [facility]   To contact the attending provider between 7A-7P or the covering provider during after hours 7P-7A, please log into the web site [email or web address] and access using universal [place] password for that web site. If you do not have the password, please call the hospital operator.  01/11/2023, 10:57 AM

## 2023-01-12 ENCOUNTER — Inpatient Hospital Stay (HOSPITAL_COMMUNITY): Payer: Medicare Other

## 2023-01-12 DIAGNOSIS — I5021 Acute systolic (congestive) heart failure: Secondary | ICD-10-CM | POA: Diagnosis not present

## 2023-01-12 DIAGNOSIS — I480 Paroxysmal atrial fibrillation: Secondary | ICD-10-CM | POA: Diagnosis not present

## 2023-01-12 LAB — CBC WITH DIFFERENTIAL/PLATELET
Abs Immature Granulocytes: 0.01 10*3/uL (ref 0.00–0.07)
Basophils Absolute: 0 10*3/uL (ref 0.0–0.1)
Basophils Relative: 1 %
Eosinophils Absolute: 0.3 10*3/uL (ref 0.0–0.5)
Eosinophils Relative: 6 %
HCT: 39.6 % (ref 39.0–52.0)
Hemoglobin: 12.9 g/dL — ABNORMAL LOW (ref 13.0–17.0)
Immature Granulocytes: 0 %
Lymphocytes Relative: 28 %
Lymphs Abs: 1.5 10*3/uL (ref 0.7–4.0)
MCH: 31.2 pg (ref 26.0–34.0)
MCHC: 32.6 g/dL (ref 30.0–36.0)
MCV: 95.9 fL (ref 80.0–100.0)
Monocytes Absolute: 0.8 10*3/uL (ref 0.1–1.0)
Monocytes Relative: 15 %
Neutro Abs: 2.7 10*3/uL (ref 1.7–7.7)
Neutrophils Relative %: 50 %
Platelets: 173 10*3/uL (ref 150–400)
RBC: 4.13 MIL/uL — ABNORMAL LOW (ref 4.22–5.81)
RDW: 14.5 % (ref 11.5–15.5)
WBC: 5.4 10*3/uL (ref 4.0–10.5)
nRBC: 0 % (ref 0.0–0.2)

## 2023-01-12 LAB — COMPREHENSIVE METABOLIC PANEL
ALT: <5 U/L (ref 0–44)
AST: 22 U/L (ref 15–41)
Albumin: 3.1 g/dL — ABNORMAL LOW (ref 3.5–5.0)
Alkaline Phosphatase: 69 U/L (ref 38–126)
Anion gap: 10 (ref 5–15)
BUN: 28 mg/dL — ABNORMAL HIGH (ref 8–23)
CO2: 26 mmol/L (ref 22–32)
Calcium: 8.3 mg/dL — ABNORMAL LOW (ref 8.9–10.3)
Chloride: 102 mmol/L (ref 98–111)
Creatinine, Ser: 1.12 mg/dL (ref 0.61–1.24)
GFR, Estimated: >60 mL/min (ref >60)
Glucose, Bld: 97 mg/dL (ref 70–99)
Potassium: 4 mmol/L (ref 3.5–5.1)
Sodium: 138 mmol/L (ref 135–145)
Total Bilirubin: 0.7 mg/dL (ref 0.3–1.2)
Total Protein: 6.4 g/dL — ABNORMAL LOW (ref 6.5–8.1)

## 2023-01-12 LAB — PHOSPHORUS: Phosphorus: 3.9 mg/dL (ref 2.5–4.6)

## 2023-01-12 LAB — MAGNESIUM: Magnesium: 2.1 mg/dL (ref 1.7–2.4)

## 2023-01-12 LAB — HEPARIN LEVEL (UNFRACTIONATED): Heparin Unfractionated: 0.5 IU/mL (ref 0.30–0.70)

## 2023-01-12 MED ORDER — FUROSEMIDE 10 MG/ML IJ SOLN
20.0000 mg | Freq: Every day | INTRAMUSCULAR | Status: DC
  Administered 2023-01-13: 20 mg via INTRAVENOUS
  Filled 2023-01-12: qty 2

## 2023-01-12 NOTE — Progress Notes (Signed)
Rounding Note    Patient Name: Mario George Date of Encounter: 01/12/2023  [facility] Cardiologist: Olga Millers, MD   Subjective   Pt denies CP or dyspnea  Inpatient Medications    Scheduled Meds:  amiodarone  400 mg Oral BID   aspirin  81 mg Oral Daily   carbidopa-levodopa  1 tablet Oral QHS   carbidopa-levodopa  2 tablet Oral QID   dapagliflozin propanediol  10 mg Oral Daily   furosemide  40 mg Intravenous Daily   multivitamin with minerals  1 tablet Oral Daily   psyllium  1 packet Oral Daily   sodium chloride flush  3 mL Intravenous Q12H   sodium chloride flush  3 mL Intravenous Q12H   spironolactone  12.5 mg Oral Daily   Continuous Infusions:  sodium chloride     heparin 1,250 Units/hr (01/12/23 0527)   PRN Meds: sodium chloride, acetaminophen, magnesium hydroxide, ondansetron (ZOFRAN) IV, sodium chloride flush   Vital Signs    Vitals:   01/11/23 1130 01/11/23 1917 01/11/23 2100 01/12/23 0408  BP: 91/61 97/66 112/80 132/82  Pulse: 64  85   Resp: 16   17  Temp: (!) 97.4 F (36.3 C) (!) 97.4 F (36.3 C)  (!) 97.5 F (36.4 C)  TempSrc: Oral Axillary  Oral  SpO2: 96% 98%  100%  Weight:    69.1 kg  Height:        Intake/Output Summary (Last 24 hours) at 01/12/2023 0854 Last data filed at 01/12/2023 0527 Gross per 24 hour  Intake 679.05 ml  Output 1950 ml  Net -1270.95 ml       01/12/2023    4:08 AM 01/11/2023   12:27 AM 01/10/2023    5:04 PM  Last 3 Weights  Weight (lbs) 152 lb 5.4 Mario 152 lb 1.9 Mario 155 lb 6.8 Mario  Weight (kg) 69.1 kg 69 kg 70.5 kg      Telemetry    Atrial fibrillation rate controlled - Personally Reviewed   Physical Exam   GEN: NAD Neck: Supple Cardiac: irregular, no gallop Respiratory: CTA GI: Soft, NT/ND MS: no edema Neuro:  Grossly intact Psych: Normal affect   Labs    High Sensitivity Troponin:   Recent Labs  Lab 01/09/23 2018 01/09/23 2213  TROPONINIHS 20* 20*      Chemistry Recent Labs   Lab 01/09/23 2018 01/10/23 0526 01/11/23 0530 01/12/23 0048  NA  --  137 138 138  K  --  3.7 4.0 4.0  CL  --  102 103 102  CO2  --  23 24 26   GLUCOSE  --  89 102* 97  BUN  --  18 25* 28*  CREATININE  --  0.99 1.11 1.12  CALCIUM  --  8.5* 8.4* 8.3*  MG 2.1  --   --  2.1  PROT  --   --   --  6.4*  ALBUMIN  --   --   --  3.1*  AST  --   --   --  22  ALT  --   --   --  <5  ALKPHOS  --   --   --  69  BILITOT  --   --   --  0.7  GFRNONAA  --  >60 >60 >60  ANIONGAP  --  12 11 10      Hematology Recent Labs  Lab 01/10/23 0526 01/11/23 0530 01/12/23 0048  WBC 5.0 5.6 5.4  RBC  4.22 4.52 4.13*  HGB 13.2 14.4 12.9*  HCT 39.6 42.5 39.6  MCV 93.8 94.0 95.9  MCH 31.3 31.9 31.2  MCHC 33.3 33.9 32.6  RDW 14.4 14.3 14.5  PLT 192 179 173    Thyroid  Recent Labs  Lab 01/09/23 2018  TSH 3.404     BNP Recent Labs  Lab 01/09/23 1642  BNP 1,095.4*      Radiology    ECHOCARDIOGRAM COMPLETE  Result Date: 01/10/2023    ECHOCARDIOGRAM REPORT   Patient Name:   Mario George Date of Exam: 01/10/2023 Medical Rec #:  [id number]   Height:       72.0 in Accession #:    [id number]  Weight:       168.0 lb Date of Birth:  09-Apr-1941    BSA:          1.978 m Patient Age:    82 years    BP:           113/94 mmHg Patient Gender: M           HR:           90 bpm. Exam Location:  Inpatient Procedure: 2D Echo, Cardiac Doppler and Color Doppler Indications:    CHF-Acute Systolic G31.51  History:        Patient has no prior history of Echocardiogram examinations.                 Risk Factors:Sleep Apnea. Parkinson's disease. Paget's disease.  Sonographer:    Darlina Sicilian RDCS Referring Phys: [place]  1. Left ventricular ejection fraction, by estimation, is 25 to 30%. The left ventricle has severely decreased function. The left ventricle demonstrates regional wall motion abnormalities (see scoring diagram/findings for description). Left ventricular diastolic function could not be  evaluated.  2. Right ventricular systolic function is low normal. The right ventricular size is normal. There is normal pulmonary artery systolic pressure. The estimated right ventricular systolic pressure is 76.1 mmHg.  3. Right atrial size was mildly dilated.  4. The mitral valve is grossly normal. Mild mitral valve regurgitation. No evidence of mitral stenosis.  5. The aortic valve is tricuspid. Aortic valve regurgitation is not visualized. No aortic stenosis is present.  6. The inferior vena cava is normal in size with <50% respiratory variability, suggesting right atrial pressure of 8 mmHg. Conclusion(s)/Recommendation(s): Findings consistent with ischemic cardiomyopathy. FINDINGS  Left Ventricle: Left ventricular ejection fraction, by estimation, is 25 to 30%. The left ventricle has severely decreased function. The left ventricle demonstrates regional wall motion abnormalities. The left ventricular internal cavity size was normal  in size. There is no left ventricular hypertrophy. Left ventricular diastolic function could not be evaluated due to atrial fibrillation. Left ventricular diastolic function could not be evaluated.  LV Wall Scoring: The mid and distal anterior septum, mid inferoseptal segment, apical anterior segment, and apex are akinetic. Right Ventricle: The right ventricular size is normal. No increase in right ventricular wall thickness. Right ventricular systolic function is low normal. There is normal pulmonary artery systolic pressure. The tricuspid regurgitant velocity is 1.99 m/s,  and with an assumed right atrial pressure of 8 mmHg, the estimated right ventricular systolic pressure is 60.7 mmHg. Left Atrium: Left atrial size was normal in size. Right Atrium: Right atrial size was mildly dilated. Pericardium: There is no evidence of pericardial effusion. Mitral Valve: The mitral valve is grossly normal. Mild mitral valve regurgitation. No evidence of mitral valve stenosis.  Tricuspid Valve:  The tricuspid valve is grossly normal. Tricuspid valve regurgitation is mild . No evidence of tricuspid stenosis. Aortic Valve: The aortic valve is tricuspid. Aortic valve regurgitation is not visualized. No aortic stenosis is present. Pulmonic Valve: The pulmonic valve was not well visualized. Pulmonic valve regurgitation is not visualized. No evidence of pulmonic stenosis. Aorta: The aortic root is normal in size and structure. Venous: The inferior vena cava is normal in size with less than 50% respiratory variability, suggesting right atrial pressure of 8 mmHg. IAS/Shunts: No atrial level shunt detected by color flow Doppler. Additional Comments: There is pleural effusion in both left and right lateral regions.  LEFT VENTRICLE PLAX 2D LVIDd:         4.30 cm      Diastology LVIDs:         3.40 cm      LV e' medial:    6.63 cm/s LV PW:         0.90 cm      LV E/e' medial:  9.8 LV IVS:        0.90 cm      LV e' lateral:   9.52 cm/s LVOT diam:     1.70 cm      LV E/e' lateral: 6.8 LV SV:         18 LV SV Index:   9 LVOT Area:     2.27 cm  LV Volumes (MOD) LV vol d, MOD A2C: 115.0 ml LV vol d, MOD A4C: 92.3 ml LV vol s, MOD A2C: 72.4 ml LV vol s, MOD A4C: 67.3 ml LV SV MOD A2C:     42.6 ml LV SV MOD A4C:     92.3 ml LV SV MOD BP:      34.8 ml RIGHT VENTRICLE TAPSE (M-mode): 1.9 cm LEFT ATRIUM             Index        RIGHT ATRIUM           Index LA diam:        4.10 cm 2.07 cm/m   RA Area:     19.70 cm LA Vol (A2C):   69.3 ml 35.03 ml/m  RA Volume:   54.40 ml  27.50 ml/m LA Vol (A4C):   54.1 ml 27.35 ml/m LA Biplane Vol: 61.5 ml 31.09 ml/m  AORTIC VALVE LVOT Vmax:   55.80 cm/s LVOT Vmean:  39.400 cm/s LVOT VTI:    0.080 m  AORTA Ao Root diam: 3.10 cm MITRAL VALVE               TRICUSPID VALVE MV Area (PHT): 8.37 cm    TR Peak grad:   15.8 mmHg MV Decel Time: 91 msec     TR Vmax:        199.00 cm/s MR Peak grad: 66.3 mmHg MR Mean grad: 42.0 mmHg    SHUNTS MR Vmax:      407.00 cm/s  Systemic VTI:  0.08 m MR  Vmean:     309.0 cm/s   Systemic Diam: 1.70 cm MV E velocity: 64.70 cm/s Sumner O'Neal MD Electronically signed by [place] O'Neal MD Signature Date/Time: 01/10/2023/1:27:03 PM    Final       Patient Profile      82-year-old male with past medical history of Parkinson's, Paget's, obstructive sleep apnea, benign prostatic hypertrophy, gastroesophageal reflux disease for evaluation of atrial fibrillation and new onset acute systolic congestive heart failure.    Echocardiogram this admission shows ejection fraction 25 to 30% with akinesis of the distal septum, apex and distal inferior wall, mild right atrial enlargement, mild mitral regurgitation.   Assessment & Plan    1 persistent atrial fibrillation-patient with newly diagnosed atrial fibrillation since electrocardiogram June 2023.  This is felt to be contributing to his congestive heart failure.  Will continue IV heparin and transition to apixaban once all procedures complete.  Continue amiodarone.  Heart rate is controlled.  Plan would be to proceed with TEE guided cardioversion prior to discharge.   2 acute systolic congestive heart failure-I/O -5307 since admission. Echo shows severe LV dysfunction.  Volume status has improved.  Will decrease Lasix to 20 mg IV daily, continue spironolactone and Farxiga.  Follow renal function.   3 cardiomyopathy-LV function severely reduced with wall motion abnormalities.  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  Plan is for right and left cardiac catheterization tomorrow.  Blood pressure has been borderline.  Will try and add low-dose carvedilol/ARB following catheterization if blood pressure and renal function allow.  Continue aspirin for presumed coronary disease.  Intolerant to statins.     4 Parkinson's-patient has some instability with ambulation but does not fall.  Will have to follow closely with addition of anticoagulation.  For questions or updates, please contact [place] Please consult  [email or web address] for contact info under        Signed, Kirk Ruths, MD  01/12/2023, 8:54 AM

## 2023-01-12 NOTE — Progress Notes (Signed)
Rounding Note    Patient Name: Mario George Date of Encounter: 01/12/2023  [facility] Cardiologist: Kirk Ruths, MD ***  Subjective   ***  Inpatient Medications    Scheduled Meds:  amiodarone  400 mg Oral BID   aspirin  81 mg Oral Daily   carbidopa-levodopa  1 tablet Oral QHS   carbidopa-levodopa  2 tablet Oral QID   dapagliflozin propanediol  10 mg Oral Daily   [START ON 01/13/2023] furosemide  20 mg Intravenous Daily   multivitamin with minerals  1 tablet Oral Daily   psyllium  1 packet Oral Daily   sodium chloride flush  3 mL Intravenous Q12H   sodium chloride flush  3 mL Intravenous Q12H   spironolactone  12.5 mg Oral Daily   Continuous Infusions:  sodium chloride     heparin 1,250 Units/hr (01/12/23 0527)   PRN Meds: sodium chloride, acetaminophen, magnesium hydroxide, ondansetron (ZOFRAN) IV, sodium chloride flush   Vital Signs    Vitals:   01/11/23 1917 01/11/23 2100 01/12/23 0408 01/12/23 1928  BP: 97/66 112/80 132/82 115/76  Pulse:  85  100  Resp:   17 20  Temp: (!) 97.4 F (36.3 C)  (!) 97.5 F (36.4 C) (!) 97.4 F (36.3 C)  TempSrc: Axillary  Oral Oral  SpO2: 98%  100% 100%  Weight:   69.1 kg   Height:        Intake/Output Summary (Last 24 hours) at 01/12/2023 2028 Last data filed at 01/12/2023 1900 Gross per 24 hour  Intake 619.05 ml  Output 1725 ml  Net -1105.95 ml      01/12/2023    4:08 AM 01/11/2023   12:27 AM 01/10/2023    5:04 PM  Last 3 Weights  Weight (lbs) 152 lb 5.4 oz 152 lb 1.9 oz 155 lb 6.8 oz  Weight (kg) 69.1 kg 69 kg 70.5 kg      Telemetry    *** - Personally Reviewed  ECG    *** - Personally Reviewed  Physical Exam  *** GEN: No acute distress.   Neck: No JVD Cardiac: RRR, no murmurs, rubs, or gallops.  Respiratory: Clear to auscultation bilaterally. GI: Soft, nontender, non-distended  MS: No edema; No deformity. Neuro:  Nonfocal  Psych: Normal affect   Labs    High Sensitivity Troponin:    Recent Labs  Lab 01/09/23 2018 01/09/23 2213  TROPONINIHS 20* 20*     Chemistry Recent Labs  Lab 01/09/23 2018 01/10/23 0526 01/11/23 0530 01/12/23 0048  NA  --  137 138 138  K  --  3.7 4.0 4.0  CL  --  102 103 102  CO2  --  23 24 26   GLUCOSE  --  89 102* 97  BUN  --  18 25* 28*  CREATININE  --  0.99 1.11 1.12  CALCIUM  --  8.5* 8.4* 8.3*  MG 2.1  --   --  2.1  PROT  --   --   --  6.4*  ALBUMIN  --   --   --  3.1*  AST  --   --   --  22  ALT  --   --   --  <5  ALKPHOS  --   --   --  69  BILITOT  --   --   --  0.7  GFRNONAA  --  >60 >60 >60  ANIONGAP  --  12 11 10     Lipids  No results for input(s): "CHOL", "TRIG", "HDL", "LABVLDL", "LDLCALC", "CHOLHDL" in the last 168 hours.  Hematology Recent Labs  Lab 01/10/23 0526 01/11/23 0530 01/12/23 0048  WBC 5.0 5.6 5.4  RBC 4.22 4.52 4.13*  HGB 13.2 14.4 12.9*  HCT 39.6 42.5 39.6  MCV 93.8 94.0 95.9  MCH 31.3 31.9 31.2  MCHC 33.3 33.9 32.6  RDW 14.4 14.3 14.5  PLT 192 179 173   Thyroid  Recent Labs  Lab 01/09/23 2018  TSH 3.404    BNP Recent Labs  Lab 01/09/23 1642  BNP 1,095.4*    DDimer No results for input(s): "DDIMER" in the last 168 hours.   Radiology    MR BRAIN WO CONTRAST  Result Date: 01/12/2023 CLINICAL DATA:  Neuro deficit, acute, stroke suspected. Episode of right-sided facial droop lasting approximately 10 minutes yesterday morning. EXAM: MRI HEAD WITHOUT CONTRAST TECHNIQUE: Multiplanar, multiecho pulse sequences of the brain and surrounding structures were obtained without intravenous contrast. COMPARISON:  None Available. FINDINGS: Brain: Artifact from dental hardware obscures portions of the anterior brain. Diffusion-weighted images demonstrate no acute infarct. Mild periventricular and scattered white matter changes are likely within normal limits for age. No acute hemorrhage or mass lesion is present. The ventricles are of normal size. No significant extraaxial fluid collection is present.  Deep brain nuclei are within normal limits. The internal auditory canals are within normal limits. The brainstem and cerebellum are within normal limits. Vascular: Flow is present in the major intracranial arteries. Midline structures are within normal limits. Skull and upper cervical spine: The craniocervical junction is normal. Upper cervical spine is within normal limits. Marrow signal is unremarkable. Sinuses/Orbits: The left paranasal scratched at the left paranasal sinuses are obscured by artifact. The visualized paranasal sinuses and mastoid air cells are clear. Visualized globes and orbits are within normal limits. IMPRESSION: Normal MRI appearance of the brain for age. No acute or focal lesion to explain the patient's symptoms. Portions the brain are obscured by artifact from dental hardware. Electronically Signed   By: Marin Roberts M.D.   On: 01/12/2023 16:55    Cardiac Studies   TTE 01/10/23: IMPRESSIONS     1. Left ventricular ejection fraction, by estimation, is 25 to 30%. The  left ventricle has severely decreased function. The left ventricle  demonstrates regional wall motion abnormalities (see scoring  diagram/findings for description). Left ventricular  diastolic function could not be evaluated.   2. Right ventricular systolic function is low normal. The right  ventricular size is normal. There is normal pulmonary artery systolic  pressure. The estimated right ventricular systolic pressure is 23.8 mmHg.   3. Right atrial size was mildly dilated.   4. The mitral valve is grossly normal. Mild mitral valve regurgitation.  No evidence of mitral stenosis.   5. The aortic valve is tricuspid. Aortic valve regurgitation is not  visualized. No aortic stenosis is present.   6. The inferior vena cava is normal in size with <50% respiratory  variability, suggesting right atrial pressure of 8 mmHg.   Conclusion(s)/Recommendation(s): Findings consistent with ischemic   cardiomyopathy.   Patient Profile     82 y.o. male  with past medical history of Parkinson's, Paget's, OSA, BPH, and GERD who presented with SOB and volume overload found to be in Afib with RVR with newly reduced LVEF 25-30% on TTE for which Cardiology was consulted.     Assessment & Plan    #Persistent Afib: Patient presented with worsening SOB with volume overload found  to be in new Afib  with RVR. TTE with LVEF 25-30% with akinesis of mid-to-apical septal segments, apical anterior and apex. Currently on heparin gtt while awaiting catheterization with plans to transition to apixaban long-term. Will plan for TEE/DCCV prior to discharge once coronary anatomy defined.  #Acute Systolic HF: LVEF 79-15% with akinesis of mid-to-apical septal segments, apical anterior and apex. Given WMA, highly concerning for ischemic etiology, although Afib with RVR may be contributing to systolic dysfunction. Planned for cath today. -Plan for RHC/LHC today -Continue ASA 81mg  daily -Intolerant to statins; can consider PCSK9i as outpatient -Add BB, ARB as able post-cath  #Parkinsons: -Management per primary -Will need to monitor for falls while on AC {Are we signing off today?:[id number]}  For questions or updates, please contact [place] Please consult [email or web address] for contact info under        Signed, Freada Bergeron, MD  01/12/2023, 8:28 PM

## 2023-01-12 NOTE — Progress Notes (Signed)
[facility] for heparin Indication: atrial fibrillation  Allergies  Allergen Reactions   Statins Other (See Comments)    Weakness/myalgia    Other Rash    Had rash on arm after pneumonia and flu vaccine together - never had either separately prior to this reaction.  Zquil - "felt like his skin was crawling"     Patient Measurements: Height: 6\' 1"  (185.4 cm) Weight: 69.1 kg (152 lb 5.4 oz) IBW/kg (Calculated) : 79.9 HEPARIN DW (KG): 70.5   Vital Signs: Temp: 97.5 F (36.4 C) (01/14 0408) Temp Source: Oral (01/14 0408) BP: 132/82 (01/14 0408) Pulse Rate: 85 (01/13 2100)  Labs: Recent Labs    01/09/23 2018 01/09/23 2213 01/10/23 0526 01/10/23 2053 01/11/23 0530 01/11/23 1426 01/12/23 0048  HGB  --   --  13.2  --  14.4  --  12.9*  HCT  --   --  39.6  --  42.5  --  39.6  PLT  --   --  192  --  179  --  173  HEPARINUNFRC  --   --   --    < > 0.43 0.36 0.50  CREATININE  --   --  0.99  --  1.11  --  1.12  TROPONINIHS 20* 20*  --   --   --   --   --    < > = values in this interval not displayed.     Estimated Creatinine Clearance: 49.7 mL/min (by C-G formula based on SCr of 1.12 mg/dL).   Medical History: Past Medical History:  Diagnosis Date   BPH with obstruction/lower urinary tract symptoms    orthostatic hypotension on flomax   Chronic hoarseness    Eczema    Legs.  Triamcinolone   GERD (gastroesophageal reflux disease)    OSA (obstructive sleep apnea)    quit cpap in 2017   Osteoarthritis, multiple sites    Paget's disease of bone    Zoledronic acid 2002 and again in 2012.  Monitoring alkaline phosphatase levels.   Parkinson's disease    Diagnosed 2018    Assessment: Patient presents to ED after visit with PCP showed afib with RVR. Patient reports having afib before but he is not on [facility] PTA. Per cardiology plan for TEE guided cardioversion following cath. Cath scheduled for 1/15. On admit, patient started on  enoxaparin for afib but has been discontinued. Last dose 1/12 0511. Pharmacy consulted to dose heparin.   Heparin level 0.50 on drip rate 1250 units/hr therapeutic. Heparin continues to be therapeutic. Hgb 12.9 and plt 173. No s/sx bleeding noted in chart.     Goal of Therapy:  Heparin level 0.3-0.7 units/ml Monitor platelets by anticoagulation protocol: Yes   Plan:  Continue heparin infusion at 1250 units/hr  Monitor daily heparin level and CBC Continue to monitor H&H    Gena Fray, PharmD PGY1 Pharmacy Resident   01/12/2023 8:26 AM

## 2023-01-12 NOTE — Progress Notes (Addendum)
PROGRESS NOTE    Mario George  UUV:[id number] DOB: 09-Jul-1941 DOA: 01/09/2023 PCP: Jeoffrey Massed, MD  Chief Complaint  Patient presents with   Leg Swelling    Brief Narrative:   82 year old male with past medical history of paget's disease, essential tremors, PACs, BPH admitted with newly diagnosed Mario George fib and systolic heart failure.  Plan for cardiac cath 01/13/23.     Assessment & Plan:   Principal Problem:   Paroxysmal atrial fibrillation (HCC) Active Problems:   Parkinson's disease   Sensorineural hearing loss (SNHL) of both ears   Acute congestive heart failure (HCC)   Paget disease of bone   OSA (obstructive sleep apnea)  Right Facial Droop  Dysarthria  Concern for TIA Notes concern for slurred speech and right sided facial droop lasting about 10 min 1/13 AM (occurred after I rounded, sometime 9-10 am) Follow MRI brain, will go ahead and get carotid US as well.  Echo has already been done.  He's already on anticoagulation for his afib. A1c, lipids Asymptomatic now, follow   Acute Heart Failure Exacerbation CXR with pulm edema and small bilateral effusions EF 25-30%, RWMA, low normal RVSF - findings c/w ischemic cardiomyopathy Diuresis per cardiology, appreciate assistance Plan for cardiac cath Monday Spironolactone, lasix (per cards, dose reduced today), farxiga - BP limiting GDMT Cardiology c/s, appreciate recs Strict I/O, daily weights  Atrial Fibrillation  TSH wnl, echo as above Beta blocker held.  Started on amiodarone.  Plan for TEE cardioversion prior to discharge. Chadsvasc 3 (age + HF) - heparin gtt  Parkinson's Disease  Sinemet  Paget's Disease  Noted, outpatient f/u   BPH Not on any home meds  GERD Not on any home meds    DVT prophylaxis: therapeutic lovenox Code Status: full Family Communication: daughter Amy, wife  Disposition:   Status is: Inpatient Remains inpatient appropriate because: awaiting completion of HF w/u, continued  diuresis   Consultants:  cardiology  Procedures:  none  Antimicrobials:  Anti-infectives (From admission, onward)    None       Subjective: Notes episode of slurred speech and right facial droop yesterday  Objective: Vitals:   01/10/23 1704 01/10/23 1936 01/11/23 0027 01/11/23 0755  BP: 110/82 99/80 103/75 (!) 90/56  Pulse: (!) 40   78  Resp: 18 16  16   Temp: (!) 97.5 F (36.4 C) 97.7 F (36.5 C) 98.1 F (36.7 C) 97.7 F (36.5 C)  TempSrc: Oral Oral Oral Oral  SpO2: 95%   95%  Weight: 70.5 kg  69 kg   Height: 6\' 1"  (1.854 m)       Intake/Output Summary (Last 24 hours) at 01/11/2023 1057 Last data filed at 01/11/2023 0842 Gross per 24 hour  Intake 623.75 ml  Output 950 ml  Net -326.25 ml   Filed Weights   01/09/23 1548 01/10/23 1704 01/11/23 0027  Weight: 76.2 kg 70.5 kg 69 kg    Examination:  General: No acute distress. Cardiovascular: irregularly irregular Lungs: Clear to auscultation bilaterally  Abdomen: Soft, nontender, nondistended  Neurological: Alert and oriented 3. Moves all extremities 4 with equal strength. Cranial nerves II through XII intact. Extremities: No clubbing or cyanosis. No edema.  Data Reviewed: I have personally reviewed following labs and imaging studies  CBC: Recent Labs  Lab 01/09/23 1642 01/10/23 0526 01/11/23 0530  WBC 4.4 5.0 5.6  NEUTROABS 2.7 3.2  --   HGB 12.5* 13.2 14.4  HCT 38.1* 39.6 42.5  MCV 96.5 93.8 94.0  PLT 190 192 179    Basic Metabolic Panel: Recent Labs  Lab 01/09/23 1642 01/09/23 2018 01/10/23 0526 01/11/23 0530  NA 137  --  137 138  K 4.1  --  3.7 4.0  CL 108  --  102 103  CO2 21*  --  23 24  GLUCOSE 102*  --  89 102*  BUN 23  --  18 25*  CREATININE 0.94  --  0.99 1.11  CALCIUM 8.3*  --  8.5* 8.4*  MG  --  2.1  --   --     GFR: Estimated Creatinine Clearance: 50.1 mL/min (by C-G formula based on SCr of 1.11 mg/dL).  Liver Function Tests: No results for input(s): "AST", "ALT",  "ALKPHOS", "BILITOT", "PROT", "ALBUMIN" in the last 168 hours.  CBG: No results for input(s): "GLUCAP" in the last 168 hours.   Recent Results (from the past 240 hour(s))  Resp panel by RT-PCR (RSV, Flu Sareena Odeh&B, Covid) Anterior Nasal Swab     Status: None   Collection Time: 01/09/23  8:26 PM   Specimen: Anterior Nasal Swab  Result Value Ref Range Status   SARS Coronavirus 2 by RT PCR NEGATIVE NEGATIVE Final    Comment: (NOTE) SARS-CoV-2 target nucleic acids are NOT DETECTED.  The SARS-CoV-2 RNA is generally detectable in upper respiratory specimens during the acute phase of infection. The lowest concentration of SARS-CoV-2 viral copies this assay can detect is 138 copies/mL. Adrienna Karis negative result does not preclude SARS-Cov-2 infection and should not be used as the sole basis for treatment or other patient management decisions. Brunilda Eble negative result may occur with  improper specimen collection/handling, submission of specimen other than nasopharyngeal swab, presence of viral mutation(s) within the areas targeted by this assay, and inadequate number of viral copies(<138 copies/mL). Serria Sloma negative result must be combined with clinical observations, patient history, and epidemiological information. The expected result is Negative.  Fact Sheet for Patients:  BloggerCourse.com  Fact Sheet for Healthcare Providers:  SeriousBroker.it  This test is no t yet approved or cleared by the [place] FDA and  has been authorized for detection and/or diagnosis of SARS-CoV-2 by FDA under an Emergency Use Authorization (EUA). This EUA will remain  in effect (meaning this test can be used) for the duration of the COVID-19 declaration under Section 564(b)(1) of the Act, 21 U.S.C.section 360bbb-3(b)(1), unless the authorization is terminated  or revoked sooner.       Influenza [place] by PCR NEGATIVE NEGATIVE Final   Influenza B by PCR NEGATIVE NEGATIVE Final     Comment: (NOTE) The Xpert Xpress SARS-CoV-2/FLU/RSV plus assay is intended as an aid in the diagnosis of influenza from Nasopharyngeal swab specimens and should not be used as Theotis Gerdeman sole basis for treatment. Nasal washings and aspirates are unacceptable for Xpert Xpress SARS-CoV-2/FLU/RSV testing.  Fact Sheet for Patients: BloggerCourse.com  Fact Sheet for Healthcare Providers: SeriousBroker.it  This test is not yet approved or cleared by the [place] FDA and has been authorized for detection and/or diagnosis of SARS-CoV-2 by FDA under an Emergency Use Authorization (EUA). This EUA will remain in effect (meaning this test can be used) for the duration of the COVID-19 declaration under Section 564(b)(1) of the Act, 21 U.S.C. section 360bbb-3(b)(1), unless the authorization is terminated or revoked.     Resp Syncytial Virus by PCR NEGATIVE NEGATIVE Final    Comment: (NOTE) Fact Sheet for Patients: BloggerCourse.com  Fact Sheet for Healthcare Providers: SeriousBroker.it  This test is not yet  approved or cleared by the [place] and has been authorized for detection and/or diagnosis of SARS-CoV-2 by FDA under an Emergency Use Authorization (EUA). This EUA will remain in effect (meaning this test can be used) for the duration of the COVID-19 declaration under Section 564(b)(1) of the Act, 21 U.S.C. section 360bbb-3(b)(1), unless the authorization is terminated or revoked.  Performed at [facility], [place] [address], [place], [place] [address]          Radiology Studies: ECHOCARDIOGRAM COMPLETE  Result Date: 01/10/2023    ECHOCARDIOGRAM REPORT   Patient Name:   Mario George Date of Exam: 01/10/2023 Medical Rec #:  [id number]   Height:       72.0 in Accession #:    [id number]  Weight:       168.0 lb Date of Birth:  [birth year]    BSA:          1.978 m Patient Age:     22 years    BP:           113/94 mmHg Patient Gender: M           HR:           90 bpm. Exam Location:  Inpatient Procedure: 2D Echo, Cardiac Doppler and Color Doppler Indications:    CHF-Acute Systolic C16.60  History:        Patient has no prior history of Echocardiogram examinations.                 Risk Factors:Sleep Apnea. Parkinson's disease. Paget's disease.  Sonographer:    Darlina Sicilian RDCS Referring Phys: [place]  1. Left ventricular ejection fraction, by estimation, is 25 to 30%. The left ventricle has severely decreased function. The left ventricle demonstrates regional wall motion abnormalities (see scoring diagram/findings for description). Left ventricular diastolic function could not be evaluated.  2. Right ventricular systolic function is low normal. The right ventricular size is normal. There is normal pulmonary artery systolic pressure. The estimated right ventricular systolic pressure is 63.0 mmHg.  3. Right atrial size was mildly dilated.  4. The mitral valve is grossly normal. Mild mitral valve regurgitation. No evidence of mitral stenosis.  5. The aortic valve is tricuspid. Aortic valve regurgitation is not visualized. No aortic stenosis is present.  6. The inferior vena cava is normal in size with <50% respiratory variability, suggesting right atrial pressure of 8 mmHg. Conclusion(s)/Recommendation(s): Findings consistent with ischemic cardiomyopathy. FINDINGS  Left Ventricle: Left ventricular ejection fraction, by estimation, is 25 to 30%. The left ventricle has severely decreased function. The left ventricle demonstrates regional wall motion abnormalities. The left ventricular internal cavity size was normal  in size. There is no left ventricular hypertrophy. Left ventricular diastolic function could not be evaluated due to atrial fibrillation. Left ventricular diastolic function could not be evaluated.  LV Wall Scoring: The mid and distal anterior septum, mid  inferoseptal segment, apical anterior segment, and apex are akinetic. Right Ventricle: The right ventricular size is normal. No increase in right ventricular wall thickness. Right ventricular systolic function is low normal. There is normal pulmonary artery systolic pressure. The tricuspid regurgitant velocity is 1.99 m/s,  and with an assumed right atrial pressure of 8 mmHg, the estimated right ventricular systolic pressure is 16.0 mmHg. Left Atrium: Left atrial size was normal in size. Right Atrium: Right atrial size was mildly dilated. Pericardium: There is no evidence of pericardial effusion. Mitral Valve: The mitral valve  is grossly normal. Mild mitral valve regurgitation. No evidence of mitral valve stenosis. Tricuspid Valve: The tricuspid valve is grossly normal. Tricuspid valve regurgitation is mild . No evidence of tricuspid stenosis. Aortic Valve: The aortic valve is tricuspid. Aortic valve regurgitation is not visualized. No aortic stenosis is present. Pulmonic Valve: The pulmonic valve was not well visualized. Pulmonic valve regurgitation is not visualized. No evidence of pulmonic stenosis. Aorta: The aortic root is normal in size and structure. Venous: The inferior vena cava is normal in size with less than 50% respiratory variability, suggesting right atrial pressure of 8 mmHg. IAS/Shunts: No atrial level shunt detected by color flow Doppler. Additional Comments: There is pleural effusion in both left and right lateral regions.  LEFT VENTRICLE PLAX 2D LVIDd:         4.30 cm      Diastology LVIDs:         3.40 cm      LV e' medial:    6.63 cm/s LV PW:         0.90 cm      LV E/e' medial:  9.8 LV IVS:        0.90 cm      LV e' lateral:   9.52 cm/s LVOT diam:     1.70 cm      LV E/e' lateral: 6.8 LV SV:         18 LV SV Index:   9 LVOT Area:     2.27 cm  LV Volumes (MOD) LV vol d, MOD A2C: 115.0 ml LV vol d, MOD A4C: 92.3 ml LV vol s, MOD A2C: 72.4 ml LV vol s, MOD A4C: 67.3 ml LV SV MOD A2C:     42.6 ml  LV SV MOD A4C:     92.3 ml LV SV MOD BP:      34.8 ml RIGHT VENTRICLE TAPSE (M-mode): 1.9 cm LEFT ATRIUM             Index        RIGHT ATRIUM           Index LA diam:        4.10 cm 2.07 cm/m   RA Area:     19.70 cm LA Vol (A2C):   69.3 ml 35.03 ml/m  RA Volume:   54.40 ml  27.50 ml/m LA Vol (A4C):   54.1 ml 27.35 ml/m LA Biplane Vol: 61.5 ml 31.09 ml/m  AORTIC VALVE LVOT Vmax:   55.80 cm/s LVOT Vmean:  39.400 cm/s LVOT VTI:    0.080 m  AORTA Ao Root diam: 3.10 cm MITRAL VALVE               TRICUSPID VALVE MV Area (PHT): 8.37 cm    TR Peak grad:   15.8 mmHg MV Decel Time: 91 msec     TR Vmax:        199.00 cm/s MR Peak grad: 66.3 mmHg MR Mean grad: 42.0 mmHg    SHUNTS MR Vmax:      407.00 cm/s  Systemic VTI:  0.08 m MR Vmean:     309.0 cm/s   Systemic Diam: 1.70 cm MV E velocity: 64.70 cm/s Lennie Odor MD Electronically signed by Lennie Odor MD Signature Date/Time: 01/10/2023/1:27:03 PM    Final    DG Chest 2 View  Result Date: 01/09/2023 CLINICAL DATA:  Shortness of breath, leg swelling EXAM: CHEST - 2 VIEW COMPARISON:  None Available. FINDINGS: Heart size is upper  limits of normal. Aortic atherosclerosis. Pulmonary vascular congestion. Mild diffuse interstitial prominence with patchy right basilar opacity. Small bilateral pleural effusions. No pneumothorax. IMPRESSION: Findings suggestive of CHF with pulmonary edema and small bilateral pleural effusions. Electronically Signed   By: Davina Poke D.O.   On: 01/09/2023 17:04        Scheduled Meds:  amiodarone  400 mg Oral BID   aspirin  81 mg Oral Daily   carbidopa-levodopa  1 tablet Oral QHS   carbidopa-levodopa  2 tablet Oral QID   dapagliflozin propanediol  10 mg Oral Daily   [START ON 01/12/2023] furosemide  40 mg Intravenous Daily   multivitamin with minerals  1 tablet Oral Daily   psyllium  1 packet Oral Daily   sodium chloride flush  3 mL Intravenous Q12H   sodium chloride flush  3 mL Intravenous Q12H   spironolactone  12.5  mg Oral Daily   Continuous Infusions:  sodium chloride     heparin 1,250 Units/hr (01/11/23 0948)     LOS: 2 days    Time spent: over 30 min    Fayrene Helper, MD [facility]   To contact the attending provider between 7A-7P or the covering provider during after hours 7P-7A, please log into the web site [email or web address] and access using universal [place] password for that web site. If you do not have the password, please call the hospital operator.  01/11/2023, 10:57 AM

## 2023-01-12 NOTE — H&P (View-Only) (Signed)
Rounding Note    Patient Name: Mario George Date of Encounter: 01/12/2023  [facility] Cardiologist: Olga Millers, MD   Subjective   Pt denies CP or dyspnea  Inpatient Medications    Scheduled Meds:  amiodarone  400 mg Oral BID   aspirin  81 mg Oral Daily   carbidopa-levodopa  1 tablet Oral QHS   carbidopa-levodopa  2 tablet Oral QID   dapagliflozin propanediol  10 mg Oral Daily   furosemide  40 mg Intravenous Daily   multivitamin with minerals  1 tablet Oral Daily   psyllium  1 packet Oral Daily   sodium chloride flush  3 mL Intravenous Q12H   sodium chloride flush  3 mL Intravenous Q12H   spironolactone  12.5 mg Oral Daily   Continuous Infusions:  sodium chloride     heparin 1,250 Units/hr (01/12/23 0527)   PRN Meds: sodium chloride, acetaminophen, magnesium hydroxide, ondansetron (ZOFRAN) IV, sodium chloride flush   Vital Signs    Vitals:   01/11/23 1130 01/11/23 1917 01/11/23 2100 01/12/23 0408  BP: 91/61 97/66 112/80 132/82  Pulse: 64  85   Resp: 16   17  Temp: (!) 97.4 F (36.3 C) (!) 97.4 F (36.3 C)  (!) 97.5 F (36.4 C)  TempSrc: Oral Axillary  Oral  SpO2: 96% 98%  100%  Weight:    69.1 kg  Height:        Intake/Output Summary (Last 24 hours) at 01/12/2023 0854 Last data filed at 01/12/2023 0527 Gross per 24 hour  Intake 679.05 ml  Output 1950 ml  Net -1270.95 ml       01/12/2023    4:08 AM 01/11/2023   12:27 AM 01/10/2023    5:04 PM  Last 3 Weights  Weight (lbs) 152 lb 5.4 Mario 152 lb 1.9 Mario 155 lb 6.8 Mario  Weight (kg) 69.1 kg 69 kg 70.5 kg      Telemetry    Atrial fibrillation rate controlled - Personally Reviewed   Physical Exam   GEN: NAD Neck: Supple Cardiac: irregular, no gallop Respiratory: CTA GI: Soft, NT/ND MS: no edema Neuro:  Grossly intact Psych: Normal affect   Labs    High Sensitivity Troponin:   Recent Labs  Lab 01/09/23 2018 01/09/23 2213  TROPONINIHS 20* 20*      Chemistry Recent Labs   Lab 01/09/23 2018 01/10/23 0526 01/11/23 0530 01/12/23 0048  NA  --  137 138 138  K  --  3.7 4.0 4.0  CL  --  102 103 102  CO2  --  23 24 26   GLUCOSE  --  89 102* 97  BUN  --  18 25* 28*  CREATININE  --  0.99 1.11 1.12  CALCIUM  --  8.5* 8.4* 8.3*  MG 2.1  --   --  2.1  PROT  --   --   --  6.4*  ALBUMIN  --   --   --  3.1*  AST  --   --   --  22  ALT  --   --   --  <5  ALKPHOS  --   --   --  69  BILITOT  --   --   --  0.7  GFRNONAA  --  >60 >60 >60  ANIONGAP  --  12 11 10      Hematology Recent Labs  Lab 01/10/23 0526 01/11/23 0530 01/12/23 0048  WBC 5.0 5.6 5.4  RBC  4.22 4.52 4.13*  HGB 13.2 14.4 12.9*  HCT 39.6 42.5 39.6  MCV 93.8 94.0 95.9  MCH 31.3 31.9 31.2  MCHC 33.3 33.9 32.6  RDW 14.4 14.3 14.5  PLT 192 179 173    Thyroid  Recent Labs  Lab 01/09/23 2018  TSH 3.404     BNP Recent Labs  Lab 01/09/23 1642  BNP 1,095.4*      Radiology    ECHOCARDIOGRAM COMPLETE  Result Date: 01/10/2023    ECHOCARDIOGRAM REPORT   Patient Name:   Mario George Date of Exam: 01/10/2023 Medical Rec #:  [id number]   Height:       72.0 in Accession #:    [id number]  Weight:       168.0 lb Date of Birth:  09-Apr-1941    BSA:          1.978 m Patient Age:    76 years    BP:           113/94 mmHg Patient Gender: M           HR:           90 bpm. Exam Location:  Inpatient Procedure: 2D Echo, Cardiac Doppler and Color Doppler Indications:    CHF-Acute Systolic G31.51  History:        Patient has no prior history of Echocardiogram examinations.                 Risk Factors:Sleep Apnea. Parkinson's disease. Paget's disease.  Sonographer:    Darlina Sicilian RDCS Referring Phys: [place]  1. Left ventricular ejection fraction, by estimation, is 25 to 30%. The left ventricle has severely decreased function. The left ventricle demonstrates regional wall motion abnormalities (see scoring diagram/findings for description). Left ventricular diastolic function could not be  evaluated.  2. Right ventricular systolic function is low normal. The right ventricular size is normal. There is normal pulmonary artery systolic pressure. The estimated right ventricular systolic pressure is 76.1 mmHg.  3. Right atrial size was mildly dilated.  4. The mitral valve is grossly normal. Mild mitral valve regurgitation. No evidence of mitral stenosis.  5. The aortic valve is tricuspid. Aortic valve regurgitation is not visualized. No aortic stenosis is present.  6. The inferior vena cava is normal in size with <50% respiratory variability, suggesting right atrial pressure of 8 mmHg. Conclusion(s)/Recommendation(s): Findings consistent with ischemic cardiomyopathy. FINDINGS  Left Ventricle: Left ventricular ejection fraction, by estimation, is 25 to 30%. The left ventricle has severely decreased function. The left ventricle demonstrates regional wall motion abnormalities. The left ventricular internal cavity size was normal  in size. There is no left ventricular hypertrophy. Left ventricular diastolic function could not be evaluated due to atrial fibrillation. Left ventricular diastolic function could not be evaluated.  LV Wall Scoring: The mid and distal anterior septum, mid inferoseptal segment, apical anterior segment, and apex are akinetic. Right Ventricle: The right ventricular size is normal. No increase in right ventricular wall thickness. Right ventricular systolic function is low normal. There is normal pulmonary artery systolic pressure. The tricuspid regurgitant velocity is 1.99 m/s,  and with an assumed right atrial pressure of 8 mmHg, the estimated right ventricular systolic pressure is 60.7 mmHg. Left Atrium: Left atrial size was normal in size. Right Atrium: Right atrial size was mildly dilated. Pericardium: There is no evidence of pericardial effusion. Mitral Valve: The mitral valve is grossly normal. Mild mitral valve regurgitation. No evidence of mitral valve stenosis.  Tricuspid Valve:  The tricuspid valve is grossly normal. Tricuspid valve regurgitation is mild . No evidence of tricuspid stenosis. Aortic Valve: The aortic valve is tricuspid. Aortic valve regurgitation is not visualized. No aortic stenosis is present. Pulmonic Valve: The pulmonic valve was not well visualized. Pulmonic valve regurgitation is not visualized. No evidence of pulmonic stenosis. Aorta: The aortic root is normal in size and structure. Venous: The inferior vena cava is normal in size with less than 50% respiratory variability, suggesting right atrial pressure of 8 mmHg. IAS/Shunts: No atrial level shunt detected by color flow Doppler. Additional Comments: There is pleural effusion in both left and right lateral regions.  LEFT VENTRICLE PLAX 2D LVIDd:         4.30 cm      Diastology LVIDs:         3.40 cm      LV e' medial:    6.63 cm/s LV PW:         0.90 cm      LV E/e' medial:  9.8 LV IVS:        0.90 cm      LV e' lateral:   9.52 cm/s LVOT diam:     1.70 cm      LV E/e' lateral: 6.8 LV SV:         18 LV SV Index:   9 LVOT Area:     2.27 cm  LV Volumes (MOD) LV vol d, MOD A2C: 115.0 ml LV vol d, MOD A4C: 92.3 ml LV vol s, MOD A2C: 72.4 ml LV vol s, MOD A4C: 67.3 ml LV SV MOD A2C:     42.6 ml LV SV MOD A4C:     92.3 ml LV SV MOD BP:      34.8 ml RIGHT VENTRICLE TAPSE (M-mode): 1.9 cm LEFT ATRIUM             Index        RIGHT ATRIUM           Index LA diam:        4.10 cm 2.07 cm/m   RA Area:     19.70 cm LA Vol (A2C):   69.3 ml 35.03 ml/m  RA Volume:   54.40 ml  27.50 ml/m LA Vol (A4C):   54.1 ml 27.35 ml/m LA Biplane Vol: 61.5 ml 31.09 ml/m  AORTIC VALVE LVOT Vmax:   55.80 cm/s LVOT Vmean:  39.400 cm/s LVOT VTI:    0.080 m  AORTA Ao Root diam: 3.10 cm MITRAL VALVE               TRICUSPID VALVE MV Area (PHT): 8.37 cm    TR Peak grad:   15.8 mmHg MV Decel Time: 91 msec     TR Vmax:        199.00 cm/s MR Peak grad: 66.3 mmHg MR Mean grad: 42.0 mmHg    SHUNTS MR Vmax:      407.00 cm/s  Systemic VTI:  0.08 m MR  Vmean:     309.0 cm/s   Systemic Diam: 1.70 cm MV E velocity: 64.70 cm/s Eleonore Chiquito MD Electronically signed by Eleonore Chiquito MD Signature Date/Time: 01/10/2023/1:27:03 PM    Final       Patient Profile      82 year old male with past medical history of Parkinson's, Paget's, obstructive sleep apnea, benign prostatic hypertrophy, gastroesophageal reflux disease for evaluation of atrial fibrillation and new onset acute systolic congestive heart failure.  Echocardiogram this admission shows ejection fraction 25 to 30% with akinesis of the distal septum, apex and distal inferior wall, mild right atrial enlargement, mild mitral regurgitation.   Assessment & Plan    1 persistent atrial fibrillation-patient with newly diagnosed atrial fibrillation since electrocardiogram June 2023.  This is felt to be contributing to his congestive heart failure.  Will continue IV heparin and transition to apixaban once all procedures complete.  Continue amiodarone.  Heart rate is controlled.  Plan would be to proceed with TEE guided cardioversion prior to discharge.   2 acute systolic congestive heart failure-I/O -5307 since admission. Echo shows severe LV dysfunction.  Volume status has improved.  Will decrease Lasix to 20 mg IV daily, continue spironolactone and Farxiga.  Follow renal function.   3 cardiomyopathy-LV function severely reduced with wall motion abnormalities.  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  Plan is for right and left cardiac catheterization tomorrow.  Blood pressure has been borderline.  Will try and add low-dose carvedilol/ARB following catheterization if blood pressure and renal function allow.  Continue aspirin for presumed coronary disease.  Intolerant to statins.     4 Parkinson's-patient has some instability with ambulation but does not fall.  Will have to follow closely with addition of anticoagulation.  For questions or updates, please contact [place] Please consult  [email or web address] for contact info under        Signed, Kirk Ruths, MD  01/12/2023, 8:54 AM

## 2023-01-12 NOTE — Plan of Care (Signed)
  Problem: Clinical Measurements: Goal: Will remain free from infection Outcome: Completed/Met   Problem: Nutrition: Goal: Adequate nutrition will be maintained Outcome: Completed/Met   Problem: Coping: Goal: Level of anxiety will decrease Outcome: Completed/Met   Problem: Elimination: Goal: Will not experience complications related to bowel motility Outcome: Completed/Met   Problem: Pain Managment: Goal: General experience of comfort will improve Outcome: Completed/Met

## 2023-01-13 ENCOUNTER — Inpatient Hospital Stay (HOSPITAL_COMMUNITY): Payer: Medicare Other

## 2023-01-13 ENCOUNTER — Encounter (HOSPITAL_COMMUNITY): Payer: Self-pay | Admitting: Cardiovascular Disease

## 2023-01-13 ENCOUNTER — Encounter (HOSPITAL_COMMUNITY)
Admission: EM | Disposition: A | Discharge status: Home / Self Care | Payer: Self-pay | Source: Home / Self Care | Attending: Family Medicine

## 2023-01-13 ENCOUNTER — Other Ambulatory Visit (HOSPITAL_COMMUNITY): Payer: Self-pay

## 2023-01-13 DIAGNOSIS — G20A1 Parkinson's disease without dyskinesia, without mention of fluctuations: Secondary | ICD-10-CM

## 2023-01-13 DIAGNOSIS — I5021 Acute systolic (congestive) heart failure: Secondary | ICD-10-CM | POA: Diagnosis not present

## 2023-01-13 DIAGNOSIS — I4891 Unspecified atrial fibrillation: Principal | ICD-10-CM

## 2023-01-13 DIAGNOSIS — I2510 Atherosclerotic heart disease of native coronary artery without angina pectoris: Secondary | ICD-10-CM

## 2023-01-13 DIAGNOSIS — I255 Ischemic cardiomyopathy: Secondary | ICD-10-CM

## 2023-01-13 DIAGNOSIS — I5023 Acute on chronic systolic (congestive) heart failure: Secondary | ICD-10-CM

## 2023-01-13 DIAGNOSIS — I480 Paroxysmal atrial fibrillation: Secondary | ICD-10-CM

## 2023-01-13 DIAGNOSIS — D6869 Other thrombophilia: Secondary | ICD-10-CM

## 2023-01-13 DIAGNOSIS — I509 Heart failure, unspecified: Secondary | ICD-10-CM | POA: Diagnosis not present

## 2023-01-13 DIAGNOSIS — I5043 Acute on chronic combined systolic (congestive) and diastolic (congestive) heart failure: Secondary | ICD-10-CM

## 2023-01-13 HISTORY — PX: RIGHT/LEFT HEART CATH AND CORONARY ANGIOGRAPHY: CATH118266

## 2023-01-13 LAB — COMPREHENSIVE METABOLIC PANEL
ALT: <5 U/L (ref 0–44)
AST: 26 U/L (ref 15–41)
Albumin: 3.2 g/dL — ABNORMAL LOW (ref 3.5–5.0)
Alkaline Phosphatase: 68 U/L (ref 38–126)
Anion gap: 7 (ref 5–15)
BUN: 22 mg/dL (ref 8–23)
CO2: 27 mmol/L (ref 22–32)
Calcium: 8.5 mg/dL — ABNORMAL LOW (ref 8.9–10.3)
Chloride: 103 mmol/L (ref 98–111)
Creatinine, Ser: 1.16 mg/dL (ref 0.61–1.24)
GFR, Estimated: >60 mL/min (ref >60)
Glucose, Bld: 99 mg/dL (ref 70–99)
Potassium: 3.7 mmol/L (ref 3.5–5.1)
Sodium: 137 mmol/L (ref 135–145)
Total Bilirubin: 0.7 mg/dL (ref 0.3–1.2)
Total Protein: 6.7 g/dL (ref 6.5–8.1)

## 2023-01-13 LAB — POCT I-STAT EG7
Acid-Base Excess: 1 mmol/L (ref 0.0–2.0)
Bicarbonate: 25.5 mmol/L (ref 20.0–28.0)
Calcium, Ion: 0.96 mmol/L — ABNORMAL LOW (ref 1.15–1.40)
HCT: 37 % — ABNORMAL LOW (ref 39.0–52.0)
Hemoglobin: 12.6 g/dL — ABNORMAL LOW (ref 13.0–17.0)
O2 Saturation: 64 %
Potassium: 3.3 mmol/L — ABNORMAL LOW (ref 3.5–5.1)
Sodium: 145 mmol/L (ref 135–145)
TCO2: 27 mmol/L (ref 22–32)
pCO2, Ven: 40.8 mmHg — ABNORMAL LOW (ref 44–60)
pH, Ven: 7.405 (ref 7.25–7.43)
pO2, Ven: 33 mmHg (ref 32–45)

## 2023-01-13 LAB — CBC WITH DIFFERENTIAL/PLATELET
Abs Immature Granulocytes: 0.02 10*3/uL (ref 0.00–0.07)
Basophils Absolute: 0 10*3/uL (ref 0.0–0.1)
Basophils Relative: 1 %
Eosinophils Absolute: 0.3 10*3/uL (ref 0.0–0.5)
Eosinophils Relative: 6 %
HCT: 40.6 % (ref 39.0–52.0)
Hemoglobin: 13.1 g/dL (ref 13.0–17.0)
Immature Granulocytes: 0 %
Lymphocytes Relative: 31 %
Lymphs Abs: 1.5 10*3/uL (ref 0.7–4.0)
MCH: 30.5 pg (ref 26.0–34.0)
MCHC: 32.3 g/dL (ref 30.0–36.0)
MCV: 94.6 fL (ref 80.0–100.0)
Monocytes Absolute: 0.6 10*3/uL (ref 0.1–1.0)
Monocytes Relative: 14 %
Neutro Abs: 2.3 10*3/uL (ref 1.7–7.7)
Neutrophils Relative %: 48 %
Platelets: 175 10*3/uL (ref 150–400)
RBC: 4.29 MIL/uL (ref 4.22–5.81)
RDW: 14.3 % (ref 11.5–15.5)
WBC: 4.8 10*3/uL (ref 4.0–10.5)
nRBC: 0 % (ref 0.0–0.2)

## 2023-01-13 LAB — POCT I-STAT 7, (LYTES, BLD GAS, ICA,H+H)
Acid-Base Excess: 1 mmol/L (ref 0.0–2.0)
Bicarbonate: 25.5 mmol/L (ref 20.0–28.0)
Calcium, Ion: 1.1 mmol/L — ABNORMAL LOW (ref 1.15–1.40)
HCT: 39 % (ref 39.0–52.0)
Hemoglobin: 13.3 g/dL (ref 13.0–17.0)
O2 Saturation: 94 %
Potassium: 3.6 mmol/L (ref 3.5–5.1)
Sodium: 141 mmol/L (ref 135–145)
TCO2: 27 mmol/L (ref 22–32)
pCO2 arterial: 37.7 mmHg (ref 32–48)
pH, Arterial: 7.438 (ref 7.35–7.45)
pO2, Arterial: 67 mmHg — ABNORMAL LOW (ref 83–108)

## 2023-01-13 LAB — HEMOGLOBIN A1C
Hgb A1c MFr Bld: 5.6 % (ref 4.8–5.6)
Mean Plasma Glucose: 114.02 mg/dL

## 2023-01-13 LAB — HEPARIN LEVEL (UNFRACTIONATED): Heparin Unfractionated: 0.42 IU/mL (ref 0.30–0.70)

## 2023-01-13 LAB — LIPID PANEL
Cholesterol: 153 mg/dL (ref 0–200)
HDL: 47 mg/dL (ref >40)
LDL Cholesterol: 91 mg/dL (ref 0–99)
Total CHOL/HDL Ratio: 3.3 RATIO
Triglycerides: 73 mg/dL (ref <150)
VLDL: 15 mg/dL (ref 0–40)

## 2023-01-13 LAB — MAGNESIUM: Magnesium: 2.1 mg/dL (ref 1.7–2.4)

## 2023-01-13 LAB — PHOSPHORUS: Phosphorus: 3.8 mg/dL (ref 2.5–4.6)

## 2023-01-13 SURGERY — RIGHT/LEFT HEART CATH AND CORONARY ANGIOGRAPHY
Anesthesia: LOCAL

## 2023-01-13 MED ORDER — HEPARIN (PORCINE) IN NACL 1000-0.9 UT/500ML-% IV SOLN
INTRAVENOUS | Status: AC
  Filled 2023-01-13: qty 1000

## 2023-01-13 MED ORDER — LABETALOL HCL 5 MG/ML IV SOLN: 10.0000 mg | INTRAVENOUS | Status: AC | PRN

## 2023-01-13 MED ORDER — VERAPAMIL HCL 2.5 MG/ML IV SOLN
INTRAVENOUS | Status: DC | PRN
  Administered 2023-01-13: 10 mL via INTRA_ARTERIAL

## 2023-01-13 MED ORDER — VERAPAMIL HCL 2.5 MG/ML IV SOLN
INTRAVENOUS | Status: AC
  Filled 2023-01-13: qty 2

## 2023-01-13 MED ORDER — FENTANYL CITRATE (PF) 100 MCG/2ML IJ SOLN
INTRAMUSCULAR | Status: AC
  Filled 2023-01-13: qty 2

## 2023-01-13 MED ORDER — POTASSIUM CHLORIDE CRYS ER 20 MEQ PO TBCR
40.0000 meq | EXTENDED_RELEASE_TABLET | Freq: Once | ORAL | Status: AC
  Administered 2023-01-13: 40 meq via ORAL
  Filled 2023-01-13: qty 2

## 2023-01-13 MED ORDER — AMIODARONE HCL 200 MG PO TABS
400.0000 mg | ORAL_TABLET | Freq: Two times a day (BID) | ORAL | Status: DC
  Administered 2023-01-13 – 2023-01-14 (×2): 400 mg via ORAL
  Filled 2023-01-13 (×2): qty 2

## 2023-01-13 MED ORDER — AMIODARONE HCL 200 MG PO TABS: 200.0000 mg | ORAL_TABLET | Freq: Every day | ORAL | Status: DC

## 2023-01-13 MED ORDER — SODIUM CHLORIDE 0.9 % IV SOLN: INTRAVENOUS | Status: AC

## 2023-01-13 MED ORDER — MIDAZOLAM HCL 2 MG/2ML IJ SOLN
INTRAMUSCULAR | Status: DC | PRN
  Administered 2023-01-13: 1 mg via INTRAVENOUS

## 2023-01-13 MED ORDER — SODIUM CHLORIDE 0.9% FLUSH: 3.0000 mL | INTRAVENOUS | Status: DC | PRN

## 2023-01-13 MED ORDER — HEPARIN SODIUM (PORCINE) 1000 UNIT/ML IJ SOLN
INTRAMUSCULAR | Status: AC
  Filled 2023-01-13: qty 10

## 2023-01-13 MED ORDER — AMIODARONE HCL 200 MG PO TABS: 200.0000 mg | ORAL_TABLET | Freq: Two times a day (BID) | ORAL | Status: DC

## 2023-01-13 MED ORDER — ASPIRIN 81 MG PO CHEW
81.0000 mg | CHEWABLE_TABLET | ORAL | Status: AC
  Administered 2023-01-13: 81 mg via ORAL
  Filled 2023-01-13: qty 1

## 2023-01-13 MED ORDER — SODIUM CHLORIDE 0.9 % IV SOLN: INTRAVENOUS | Status: DC

## 2023-01-13 MED ORDER — IOHEXOL 350 MG/ML SOLN
INTRAVENOUS | Status: DC | PRN
  Administered 2023-01-13: 40 mL

## 2023-01-13 MED ORDER — LIDOCAINE HCL (PF) 1 % IJ SOLN
INTRAMUSCULAR | Status: AC
  Filled 2023-01-13: qty 30

## 2023-01-13 MED ORDER — MIDAZOLAM HCL 2 MG/2ML IJ SOLN
INTRAMUSCULAR | Status: AC
  Filled 2023-01-13: qty 2

## 2023-01-13 MED ORDER — SODIUM CHLORIDE 0.9 % IV SOLN: 250.0000 mL | INTRAVENOUS | Status: DC | PRN

## 2023-01-13 MED ORDER — FENTANYL CITRATE (PF) 100 MCG/2ML IJ SOLN
INTRAMUSCULAR | Status: DC | PRN
  Administered 2023-01-13: 25 ug via INTRAVENOUS

## 2023-01-13 MED ORDER — HYDRALAZINE HCL 20 MG/ML IJ SOLN: 10.0000 mg | INTRAMUSCULAR | Status: AC | PRN

## 2023-01-13 MED ORDER — LIDOCAINE HCL (PF) 1 % IJ SOLN
INTRAMUSCULAR | Status: DC | PRN
  Administered 2023-01-13 (×2): 2 mL

## 2023-01-13 MED ORDER — HEPARIN (PORCINE) 25000 UT/250ML-% IV SOLN
1250.0000 [IU]/h | INTRAVENOUS | Status: DC
  Administered 2023-01-13: 1250 [IU]/h via INTRAVENOUS
  Filled 2023-01-13: qty 250

## 2023-01-13 MED ORDER — SODIUM CHLORIDE 0.9% FLUSH
3.0000 mL | Freq: Two times a day (BID) | INTRAVENOUS | Status: DC
  Administered 2023-01-13 – 2023-01-14 (×3): 3 mL via INTRAVENOUS

## 2023-01-13 MED ORDER — HEPARIN SODIUM (PORCINE) 1000 UNIT/ML IJ SOLN
INTRAMUSCULAR | Status: DC | PRN
  Administered 2023-01-13: 3500 [IU] via INTRAVENOUS

## 2023-01-13 MED ORDER — HEPARIN (PORCINE) IN NACL 1000-0.9 UT/500ML-% IV SOLN
INTRAVENOUS | Status: DC | PRN
  Administered 2023-01-13 (×2): 500 mL

## 2023-01-13 SURGICAL SUPPLY — 12 items
CATH 5FR JL3.5 JR4 ANG PIG MP (CATHETERS) IMPLANT
CATH BALLN WEDGE 5F 110CM (CATHETERS) IMPLANT
DEVICE RAD COMP TR BAND LRG (VASCULAR PRODUCTS) IMPLANT
GLIDESHEATH SLEND SS 6F .021 (SHEATH) IMPLANT
GUIDEWIRE .025 260CM (WIRE) IMPLANT
GUIDEWIRE INQWIRE 1.5J.035X260 (WIRE) IMPLANT
INQWIRE 1.5J .035X260CM (WIRE) ×1
KIT HEART LEFT (KITS) ×2 IMPLANT
PACK CARDIAC CATHETERIZATION (CUSTOM PROCEDURE TRAY) ×2 IMPLANT
SHEATH GLIDE SLENDER 4/5FR (SHEATH) IMPLANT
TRANSDUCER W/STOPCOCK (MISCELLANEOUS) ×2 IMPLANT
TUBING CIL FLEX 10 FLL-RA (TUBING) ×2 IMPLANT

## 2023-01-13 NOTE — Progress Notes (Signed)
   01/13/23 1600  Mobility  Activity Moved into chair position in bed  Range of Motion/Exercises Right leg;Left leg  Activity Response Tolerated well  Mobility Referral Yes  $Mobility charge 1 Mobility   Mobility Specialist Progress Note  Pt in bed and agreeable. M/C for ambulation but able to do bed exercises. Had no c/o pain throughout. Left in bed w/ all needs met and call bell in reach.  Lucious Groves Mobility Specialist  Please contact via SecureChat or Rehab office at [phone]

## 2023-01-13 NOTE — Plan of Care (Signed)
Alert and oriented x 4. Assistance with ADLs provided as needed. Call bell within reach. Bed in lowest position.     Problem: Education: Goal: Understanding of CV disease, CV risk reduction, and recovery process will improve Outcome: Progressing Goal: Individualized Educational Video(s) Outcome: Progressing   Problem: Activity: Goal: Ability to return to baseline activity level will improve Outcome: Progressing   Problem: Cardiovascular: Goal: Ability to achieve and maintain adequate cardiovascular perfusion will improve Outcome: Progressing Goal: Vascular access site(s) Level 0-1 will be maintained Outcome: Progressing   Problem: Health Behavior/Discharge Planning: Goal: Ability to safely manage health-related needs after discharge will improve Outcome: Progressing   Problem: Education: Goal: Knowledge of General Education information will improve Description: Including pain rating scale, medication(s)/side effects and non-pharmacologic comfort measures Outcome: Progressing   Problem: Health Behavior/Discharge Planning: Goal: Ability to manage health-related needs will improve Outcome: Progressing   Problem: Clinical Measurements: Goal: Ability to maintain clinical measurements within normal limits will improve Outcome: Progressing Goal: Diagnostic test results will improve Outcome: Progressing Goal: Respiratory complications will improve Outcome: Progressing Goal: Cardiovascular complication will be avoided Outcome: Progressing   Problem: Activity: Goal: Risk for activity intolerance will decrease Outcome: Progressing   Problem: Safety: Goal: Ability to remain free from injury will improve Outcome: Progressing   Problem: Skin Integrity: Goal: Risk for impaired skin integrity will decrease Outcome: Progressing

## 2023-01-13 NOTE — Progress Notes (Addendum)
   Heart Failure Stewardship Pharmacist Progress Note   PCP: Tammi Sou, MD PCP-Cardiologist: Kirk Ruths, MD    HPI:  82 yo M with PMH of Parkinson's disease, Paget's disease, OSA, PACs, GERD, BPH, and orthostatic hypotension.  He was diagnosed with Parkinson's disease in 2018. He follows with neurology and is noted to walk independently. He has freezing of gait and postural instability, per neuro note in Jan 2024. He does have a history of orthostatic hypotension that has not given him problems recently. He has not fallen in the last six months.   He presented to the ED from his PCP office with LE edema, new onset afib and shortness of breath. CXR with findings suggestive of CHF with pulmonary edema and small bilateral pleural effusions. ECHO 1/12 showed LVEF 25-30%, regional wall motion abnormalities, RV low normal. 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 Recommended medical management of CAD. Normal right heart pressures.  Current HF Medications: MRA: spironolactone 12.5 mg daily SGLT2i: Farxiga 10 mg daily  Prior to admission HF Medications: None  Pertinent Lab Values: Serum creatinine 1.16, BUN 22, Potassium 3.3, Sodium 145, BNP 1095.4, Magnesium 2.1, A1c 5.6   Vital Signs: Weight: 151 lbs (admission weight: 155 lbs) Blood pressure: 90/60s  Heart rate: 70s  I/O: -1.2L yesterday; net -7.1L  Medication Assistance / Insurance Benefits Check: Does the patient have prescription insurance?  Yes Type of insurance plan: [facility] Medicare  Outpatient Pharmacy:  Prior to admission outpatient pharmacy: [facility] Is the patient willing to use [facility] at discharge? Yes Is the patient willing to transition their outpatient pharmacy to utilize a [facility] outpatient pharmacy?   Pending    Assessment: 1. Acute systolic CHF (LVEF 59-29%), due to ICM. NYHA class II symptoms. - Off IV lasix, wedge 13 on  cath. Strict I/Os and daily weights. Keep K>4 - 40 mEq x 1 ordered for replacement. Keep Mg>2. - Consider cautiously adding low dose BB for HFrEF. Will need to be careful with orthostatic hypotension. - Continue spironolactone 12.5 mg daily - Continue Farxiga 10 mg daily   Plan: 1) Medication changes recommended at this time: - Start low dose metoprolol if BP ok tomorrow  2) Patient assistance: Wilder Glade copay $47  3)  Education  - To be completed prior to discharge  Kerby Nora, PharmD, BCPS Heart Failure Cytogeneticist Phone [phone]

## 2023-01-13 NOTE — Progress Notes (Signed)
Heart Failure Nurse Navigator Progress Note  PCP: Tammi Sou, MD PCP-Cardiologist: Stanford Breed Admission Diagnosis: Atrial fibrillation, Acute on chronic congestive heart failure.  Admitted from: Home  Presentation:   Micael Hampshire presented with 6-8 weeks of swollen legs and ankles from PCP, shortness of breath, new onset A-fib. BP 128/101, HR 56, BNP 1,095, CXR findings of CHF with pulmonary edema, and small bilateral pleural effusions. R/L HC on 1/15 showed chronic total occlusion of proximal LAD and mid prox RCA lesion. Recommendation to med manage.    Patient , wife and daughter were educated on the sign and symptoms of heart failure, daily weights, when to call his doctor or go to the ED, diet/ fluid restrictions, taking all medications as prescribed and attending all medical appointments, both patient and family verbalized their understanding, a hospital HF TOC follow up appointment was scheduled for 01/21/23.   ECHO/ LVEF: 25-30%   Clinical Course:  Past Medical History:  Diagnosis Date   BPH with obstruction/lower urinary tract symptoms    orthostatic hypotension on flomax   Chronic hoarseness    Eczema    Legs.  Triamcinolone   GERD (gastroesophageal reflux disease)    OSA (obstructive sleep apnea)    quit cpap in 2017   Osteoarthritis, multiple sites    Paget's disease of bone    Zoledronic acid 2002 and again in 2012.  Monitoring alkaline phosphatase levels.   Parkinson's disease    Diagnosed 2018     Social History   Socioeconomic History   Marital status: Married    Spouse name: Not on file   Number of children: Not on file   Years of education: Not on file   Highest education level: Not on file  Occupational History   Occupation: retired    Comment: health care admin/hospital  Tobacco Use   Smoking status: Former    Types: Cigarettes    Quit date: 12/30/1982    Years since quitting: 40.0   Smokeless tobacco: Never  Vaping Use   Vaping Use: Never used   Substance and Sexual Activity   Alcohol use: Yes    Comment: one drink (wine or beer) daily   Drug use: No   Sexual activity: Not Currently  Other Topics Concern   Not on file  Social History Narrative   Married, several children, also grandchildren   Originally from [place].     Moved to [facility] April 2023.   Educ: College   Occup: retired Programmer, applications   No T/A/Ds   Social Determinants of Radio broadcast assistant Strain: Low Risk  (11/06/2022)   Overall Financial Resource Strain (CARDIA)    Difficulty of Paying Living Expenses: Not hard at all  Food Insecurity: No Food Insecurity (11/06/2022)   Hunger Vital Sign    Worried About Running Out of Food in the Last Year: Never true    [place] in the Last Year: Never true  Transportation Needs: No Transportation Needs (11/06/2022)   PRAPARE - Hydrologist (Medical): No    Lack of Transportation (Non-Medical): No  Physical Activity: Sufficiently Active (11/06/2022)   Exercise Vital Sign    Days of Exercise per Week: 5 days    Minutes of Exercise per Session: 30 min  Stress: No Stress Concern Present (11/06/2022)   [place]    Feeling of Stress : Not at all  Social Connections: Moderately Isolated (  11/06/2022)   Social Connection and Isolation Panel [NHANES]    Frequency of Communication with Friends and Family: More than three times a week    Frequency of Social Gatherings with Friends and Family: Once a week    Attends Religious Services: Never    Marine scientist or Organizations: No    Attends Music therapist: Never    Marital Status: Married   Teacher, early years/pre and Provision:  Detailed education and instructions provided on heart failure disease management including the following:  Signs and symptoms of Heart Failure When to call the physician Importance of daily weights Low sodium  diet Fluid restriction Medication management Anticipated future follow-up appointments  Patient education given on each of the above topics.  Patient acknowledges understanding via teach back method and acceptance of all instructions.  Education Materials:  "Living Better With Heart Failure" Booklet, HF zone tool, & Daily Weight Tracker Tool.  Patient has scale at home: yes Patient has pill box at home: yes    High Risk Criteria for Readmission and/or Poor Patient Outcomes: Heart failure hospital admissions (last 6 months): 1  No Show rate: 0 Difficult social situation: No Demonstrates medication adherence: Yes Primary Language: English Literacy level: Reading, writing, and comprehension  Barriers of Care:   New HF education continued Diet/ fluids/ daily weight? Medication costs on fixed income.    Considerations/Referrals:   Referral made to Heart Failure Pharmacist Stewardship: yes Referral made to Heart Failure CSW/NCM TOC: No Referral made to Heart & Vascular TOC clinic: Yes, 01/21/23  Items for Follow-up on DC/TOC: Possible medication cost of new meds on a fixed income Continued New HF education Diet/ fluid/ daily weights   Earnestine Leys, BSN, RN Heart Failure Transport planner Only

## 2023-01-13 NOTE — Interval H&P Note (Signed)
History and Physical Interval Note:  01/13/2023 7:45 AM  Mario George  has presented today for surgery, with the diagnosis of unstable angina, new systolic heart failure.  The various methods of treatment have been discussed with the patient and family. After consideration of risks, benefits and other options for treatment, the patient has consented to  Procedure(s): RIGHT/LEFT HEART CATH AND CORONARY ANGIOGRAPHY (N/A) as a surgical intervention.  The patient's history has been reviewed, patient examined, no change in status, stable for surgery.  I have reviewed the patient's chart and labs.  Questions were answered to the patient's satisfaction.    Cath Lab Visit (complete for each Cath Lab visit)  Clinical Evaluation Leading to the Procedure:   ACS: No.  Non-ACS:    Anginal Classification: No Symptoms  Anti-ischemic medical therapy: No Therapy  Non-Invasive Test Results: No non-invasive testing performed  Prior CABG: No previous CABG        Lauree Chandler

## 2023-01-13 NOTE — Progress Notes (Signed)
VASCULAR LAB    Carotid duplex has been performed.  See CV proc for preliminary results.   Vicki Chaffin, RVT 01/13/2023, 11:57 AM

## 2023-01-13 NOTE — Progress Notes (Signed)
PROGRESS NOTE  Mario George  DOB: [birth year]  PCP: Tammi Sou, MD JSE:[id number]  DOA: 01/09/2023  LOS: 4 days  Hospital Day: 5  Brief narrative: Mario George is a 82 y.o. male with PMH OSA not on CPAP, GERD, Parkinson's disease, Paget's disease, BPH, essential tremors, PACs 1/11, patient presented to the ED with complaint of grossly worsening bilateral lower extremity edema, cough, fatigue.  He was seen at PCPs office.  EKG showed A-fib with a rate in the low 100s and hence directed to the ED Admitted to [facility] On further workup, patient was noted to have acute systolic CHF with ejection fraction 25 to 30% Cardiology consulted See below for details  Subjective: Patient was seen and examined this morning.  Pleasant elderly Caucasian male.  Propped up in bed.  Not in distress.  Wife and daughter at bedside. In the last 24 hours, patient was afebrile, heart rate close to 80s, blood pressure in low normal range, Underwent cardiac cath this morning.  Stable Last set of blood work from this morning showed potassium low at 3.3  Assessment and plan: A-fib with RVR New diagnosis of A-fib Seen by cardiology. Currently rate controlled on amiodarone.  Noted a plan for TEE guided cardioversion prior to discharge For anticoagulation, patient remains on IV heparin drip.  Plan to transition to oral Eliquis prior to discharge. TSH normal  Acute systolic CHF Ischemic cardiomyopathy Presented with progressively worsening bilateral lower extremity edema and fatigue for few months Echocardiogram showed EF 25 to 30% with regional wall motion abnormalities consistent with ischemic cardiomyopathy 1/15, underwent cardiac cath which showed chronic occlusion of the proximal LAD but it fills from the collaterals.  Normal RCA and circumflex.  No stent was deployed. Cardiology to decide what time to resume heparin drip.  Continue aspirin 81 mg daily.  LDL 91.  Not on statin.   CHF regimen per cardiology.   Currently on IV Lasix 20 mg daily, Farxiga 10 mg daily, Aldactone 12.5 mg daily Net IO Since Admission: -6,685.71 mL [01/13/23 1102] Continue to monitor for daily intake output, weight, blood pressure, BNP, renal function and electrolytes. Recent Labs  Lab 01/09/23 1642 01/09/23 2018 01/10/23 0526 01/11/23 0530 01/12/23 0048 01/13/23 0043 01/13/23 0812 01/13/23 0813  BNP 1,095.4*  --   --   --   --   --   --   --   BUN 23  --  18 25* 28* 22  --   --   CREATININE 0.94  --  0.99 1.11 1.12 1.16  --   --   K 4.1  --  3.7 4.0 4.0 3.7 3.6 3.3*  MG  --  2.1  --   --  2.1 2.1  --   --     Hypokalemia Potassium low at 3.3 today.  Replacement ordered.  Acute neurological symptoms 1/13, patient was noted to have right facial droop, dysarthria.  Concern of TIA was raised Workup initiated. MRI brain did not show any acute or focal lesion.  Pending carotid ultrasound. Symptoms gradually resolved. Anticoagulation plan as above.  Parkinson's Disease  Continue Sinemet   Paget's Disease  Noted, outpatient f/u    BPH Not on any home meds  GERD Not on any home meds   Mobility: PT eval ordered.  Previously used walker and cane at home  Goals of care   Code Status: Full Code    Scheduled Meds:  amiodarone  400 mg Oral BID   aspirin  81 mg Oral Daily   carbidopa-levodopa  1 tablet Oral QHS   carbidopa-levodopa  2 tablet Oral QID   dapagliflozin propanediol  10 mg Oral Daily   furosemide  20 mg Intravenous Daily   multivitamin with minerals  1 tablet Oral Daily   potassium chloride  40 mEq Oral Once   psyllium  1 packet Oral Daily   sodium chloride flush  3 mL Intravenous Q12H   sodium chloride flush  3 mL Intravenous Q12H   sodium chloride flush  3 mL Intravenous Q12H   spironolactone  12.5 mg Oral Daily    PRN meds: sodium chloride, sodium chloride, acetaminophen, hydrALAZINE, labetalol, magnesium hydroxide, ondansetron (ZOFRAN) IV, sodium chloride flush, sodium chloride  flush   Infusions:   sodium chloride     sodium chloride 50 mL/hr at 01/13/23 1009   sodium chloride      Skin assessment:    Nutritional status:  Body mass index is 19.99 kg/m.          Diet:  Diet Order             Diet Heart Room service appropriate? Yes; Fluid consistency: Thin  Diet effective now                   DVT prophylaxis: Heparin drip    Antimicrobials: None Fluid: None Consultants: Cardiology Family Communication: Wife and daughter at bedside  Status is: Inpatient  Continue in-hospital care because: Pending TEE tomorrow, pending PT eval Level of care: Telemetry Cardiac   Dispo: The patient is from: Home              Anticipated d/c is to: Pending PT eval              Patient currently is not medically stable to d/c.   Difficult to place patient No    Antimicrobials: Anti-infectives (From admission, onward)    None       Objective: Vitals:   01/13/23 0900 01/13/23 1003  BP: 96/69 (!) 82/62  Pulse:  78  Resp: 16 16  Temp: (!) 97.4 F (36.3 C) (!) 97.4 F (36.3 C)  SpO2: 97% 99%    Intake/Output Summary (Last 24 hours) at 01/13/2023 1102 Last data filed at 01/13/2023 6295 Gross per 24 hour  Intake 571.49 ml  Output 1950 ml  Net -1378.51 ml   Filed Weights   01/11/23 0027 01/12/23 0408 01/13/23 0511  Weight: 69 kg 69.1 kg 68.7 kg   Weight change: -0.38 kg Body mass index is 19.99 kg/m.   Physical Exam: General exam: Pleasant, elderly Caucasian male.  Not in physical distress Skin: No rashes, lesions or ulcers. HEENT: Atraumatic, normocephalic, no obvious bleeding Lungs: Clear to auscultation bilaterally CVS: Rate controlled, no murmur  GI/Abd soft, nontender, nondistended, bowel sound present CNS: Alert, awake, oriented x 3 Psychiatry: Mood appropriate Extremities: No pedal edema, no calf tenderness.  Right wrist post-cath bandage on.  Data Review: I have personally reviewed the laboratory data and studies  available.  F/u labs ordered Unresulted Labs (From admission, onward)     Start     Ordered   01/14/23 0500  Basic metabolic panel  Tomorrow morning,   R        01/13/23 0857   01/14/23 0500  CBC  Tomorrow morning,   R        01/13/23 0857            Total time spent in review of  labs and imaging, patient evaluation, formulation of plan, documentation and communication with family: 17 minutes  Signed, Terrilee Croak, MD [facility] 01/13/2023

## 2023-01-13 NOTE — Progress Notes (Addendum)
[place] for heparin Indication: atrial fibrillation  Allergies  Allergen Reactions   Statins Other (See Comments)    Weakness/myalgia    Other Rash    Had rash on arm after pneumonia and flu vaccine together - never had either separately prior to this reaction.  Zquil - "felt like his skin was crawling"     Patient Measurements: Height: 6\' 1"  (185.4 cm) Weight: 68.7 kg (151 lb 8 oz) IBW/kg (Calculated) : 79.9 HEPARIN DW (KG): 70.5   Vital Signs: Temp: 97.8 F (36.6 C) (01/15 1104) Temp Source: Oral (01/15 1104) BP: 89/59 (01/15 1214) Pulse Rate: 81 (01/15 1214)  Labs: Recent Labs    01/11/23 0530 01/11/23 1426 01/12/23 0048 01/13/23 0043 01/13/23 0812 01/13/23 0813  HGB 14.4  --  12.9* 13.1 13.3 12.6*  HCT 42.5  --  39.6 40.6 39.0 37.0*  PLT 179  --  173 175  --   --   HEPARINUNFRC 0.43 0.36 0.50 0.42  --   --   CREATININE 1.11  --  1.12 1.16  --   --      Estimated Creatinine Clearance: 47.7 mL/min (by C-G formula based on SCr of 1.16 mg/dL).   Medical History: Past Medical History:  Diagnosis Date   BPH with obstruction/lower urinary tract symptoms    orthostatic hypotension on flomax   Chronic hoarseness    Eczema    Legs.  Triamcinolone   GERD (gastroesophageal reflux disease)    OSA (obstructive sleep apnea)    quit cpap in 2017   Osteoarthritis, multiple sites    Paget's disease of bone    Zoledronic acid 2002 and again in 2012.  Monitoring alkaline phosphatase levels.   Parkinson's disease    Diagnosed 2018    Assessment: Patient presents to ED after visit with PCP showed afib with RVR. Patient reports having afib before but he is not on [facility] PTA. Per cardiology plan for TEE guided cardioversion following cath.   Now s/p cath on 1/15 and pharmacy consulted to resume IV heparin 8 hours post sheath removal.  TR band removed around 1309.  No bleeding documented.  Goal of Therapy:  Heparin level 0.3-0.7  units/ml Monitor platelets by anticoagulation protocol: Yes  Plan:  Resume heparin infusion at 1250 units/hr  Check 8 hr heparin level Monitor daily heparin level and CBC  Evy Lutterman D. Mina Marble, PharmD, BCPS, [place] 01/13/2023, 4:56 PM

## 2023-01-13 NOTE — Plan of Care (Signed)
  Problem: Education: Goal: Understanding of CV disease, CV risk reduction, and recovery process will improve Outcome: Progressing Goal: Individualized Educational Video(s) Outcome: Progressing   Problem: Activity: Goal: Ability to return to baseline activity level will improve Outcome: Progressing   Problem: Cardiovascular: Goal: Ability to achieve and maintain adequate cardiovascular perfusion will improve Outcome: Progressing Goal: Vascular access site(s) Level 0-1 will be maintained Outcome: Progressing   Problem: Health Behavior/Discharge Planning: Goal: Ability to safely manage health-related needs after discharge will improve Outcome: Progressing   Problem: Education: Goal: Knowledge of General Education information will improve Description: Including pain rating scale, medication(s)/side effects and non-pharmacologic comfort measures Outcome: Progressing   Problem: Health Behavior/Discharge Planning: Goal: Ability to manage health-related needs will improve Outcome: Progressing   Problem: Clinical Measurements: Goal: Ability to maintain clinical measurements within normal limits will improve Outcome: Progressing Goal: Diagnostic test results will improve Outcome: Progressing Goal: Respiratory complications will improve Outcome: Progressing Goal: Cardiovascular complication will be avoided Outcome: Progressing   Problem: Activity: Goal: Risk for activity intolerance will decrease Outcome: Progressing   Problem: Safety: Goal: Ability to remain free from injury will improve Outcome: Progressing   Problem: Skin Integrity: Goal: Risk for impaired skin integrity will decrease Outcome: Progressing

## 2023-01-13 NOTE — Plan of Care (Signed)
  Problem: Elimination: Goal: Will not experience complications related to urinary retention Outcome: Completed/Met

## 2023-01-13 NOTE — TOC Progression Note (Addendum)
Transition of Care [facility]) - Progression Note    Patient Details  Name: Mario George MRN: [id number] Date of Birth: April 16, 1941  [facility] CM/SW Contact  Zenon Mayo, RN Phone Number: 01/13/2023, 3:07 PM  Clinical Narrative:     afib, chf, right and left heart cath today, tomorrow, benfit check in process for eliquis and xarelto. TOC following.  Per pharmacy benefit check copay is 47.00 for both eliquis and xarelto.       Expected Discharge Plan and Services                                               Social Determinants of Health (SDOH) Interventions SDOH Screenings   Food Insecurity: No Food Insecurity (11/06/2022)  Housing: Low Risk  (11/06/2022)  Transportation Needs: No Transportation Needs (11/06/2022)  Depression (PHQ2-9): Low Risk  (11/06/2022)  Financial Resource Strain: Low Risk  (11/06/2022)  Physical Activity: Sufficiently Active (11/06/2022)  Social Connections: Moderately Isolated (11/06/2022)  Stress: No Stress Concern Present (11/06/2022)  Tobacco Use: Medium Risk (01/13/2023)    Readmission Risk Interventions     No data to display

## 2023-01-14 ENCOUNTER — Other Ambulatory Visit (HOSPITAL_COMMUNITY): Payer: Self-pay

## 2023-01-14 ENCOUNTER — Encounter (HOSPITAL_COMMUNITY): Payer: Self-pay | Admitting: Certified Registered"

## 2023-01-14 ENCOUNTER — Telehealth (HOSPITAL_COMMUNITY): Payer: Self-pay

## 2023-01-14 ENCOUNTER — Encounter (HOSPITAL_COMMUNITY): Payer: Self-pay | Admitting: Family Medicine

## 2023-01-14 DIAGNOSIS — I2510 Atherosclerotic heart disease of native coronary artery without angina pectoris: Secondary | ICD-10-CM | POA: Diagnosis not present

## 2023-01-14 DIAGNOSIS — I5021 Acute systolic (congestive) heart failure: Secondary | ICD-10-CM | POA: Diagnosis not present

## 2023-01-14 DIAGNOSIS — I480 Paroxysmal atrial fibrillation: Secondary | ICD-10-CM | POA: Diagnosis not present

## 2023-01-14 DIAGNOSIS — I4819 Other persistent atrial fibrillation: Secondary | ICD-10-CM | POA: Diagnosis not present

## 2023-01-14 LAB — BASIC METABOLIC PANEL
Anion gap: 7 (ref 5–15)
BUN: 21 mg/dL (ref 8–23)
CO2: 26 mmol/L (ref 22–32)
Calcium: 8.8 mg/dL — ABNORMAL LOW (ref 8.9–10.3)
Chloride: 104 mmol/L (ref 98–111)
Creatinine, Ser: 1.06 mg/dL (ref 0.61–1.24)
GFR, Estimated: >60 mL/min (ref >60)
Glucose, Bld: 102 mg/dL — ABNORMAL HIGH (ref 70–99)
Potassium: 4.3 mmol/L (ref 3.5–5.1)
Sodium: 137 mmol/L (ref 135–145)

## 2023-01-14 LAB — CBC
HCT: 41.1 % (ref 39.0–52.0)
Hemoglobin: 13.8 g/dL (ref 13.0–17.0)
MCH: 31.4 pg (ref 26.0–34.0)
MCHC: 33.6 g/dL (ref 30.0–36.0)
MCV: 93.6 fL (ref 80.0–100.0)
Platelets: 175 10*3/uL (ref 150–400)
RBC: 4.39 MIL/uL (ref 4.22–5.81)
RDW: 14.3 % (ref 11.5–15.5)
WBC: 4.8 10*3/uL (ref 4.0–10.5)
nRBC: 0 % (ref 0.0–0.2)

## 2023-01-14 LAB — HEPARIN LEVEL (UNFRACTIONATED)
Heparin Unfractionated: 0.41 IU/mL (ref 0.30–0.70)
Heparin Unfractionated: 0.44 IU/mL (ref 0.30–0.70)

## 2023-01-14 MED ORDER — FUROSEMIDE 40 MG PO TABS
40.0000 mg | ORAL_TABLET | Freq: Every day | ORAL | 2 refills | Status: AC | PRN
  Filled 2023-01-14: qty 30, 30d supply, fill #0

## 2023-01-14 MED ORDER — SPIRONOLACTONE 25 MG PO TABS
12.5000 mg | ORAL_TABLET | Freq: Every day | ORAL | 2 refills | Status: DC
  Filled 2023-01-14: qty 15, 30d supply, fill #0

## 2023-01-14 MED ORDER — APIXABAN 5 MG PO TABS
5.0000 mg | ORAL_TABLET | Freq: Two times a day (BID) | ORAL | Status: DC
  Administered 2023-01-14: 5 mg via ORAL
  Filled 2023-01-14: qty 1

## 2023-01-14 MED ORDER — AMIODARONE HCL 200 MG PO TABS
200.0000 mg | ORAL_TABLET | Freq: Every day | ORAL | 1 refills | Status: DC
  Filled 2023-01-14: qty 30, 30d supply, fill #0

## 2023-01-14 MED ORDER — DAPAGLIFLOZIN PROPANEDIOL 10 MG PO TABS
10.0000 mg | ORAL_TABLET | Freq: Every day | ORAL | 2 refills | Status: DC
  Filled 2023-01-14: qty 30, 30d supply, fill #0

## 2023-01-14 MED ORDER — AMIODARONE HCL 400 MG PO TABS
400.0000 mg | ORAL_TABLET | Freq: Two times a day (BID) | ORAL | 0 refills | Status: DC
  Filled 2023-01-14: qty 8, 4d supply, fill #0

## 2023-01-14 MED ORDER — AMIODARONE HCL 200 MG PO TABS
200.0000 mg | ORAL_TABLET | Freq: Two times a day (BID) | ORAL | 0 refills | Status: DC
  Filled 2023-01-14: qty 48, 30d supply, fill #0

## 2023-01-14 MED ORDER — APIXABAN 5 MG PO TABS
5.0000 mg | ORAL_TABLET | Freq: Two times a day (BID) | ORAL | 2 refills | Status: DC
  Filled 2023-01-14: qty 60, 30d supply, fill #0

## 2023-01-14 NOTE — Progress Notes (Signed)
   Heart Failure Stewardship Pharmacist Progress Note   PCP: Tammi Sou, MD PCP-Cardiologist: Kirk Ruths, MD    HPI:  82 yo M with PMH of Parkinson's disease, Paget's disease, OSA, PACs, GERD, BPH, and orthostatic hypotension.  He was diagnosed with Parkinson's disease in 2018. He follows with neurology and is noted to walk independently. He has freezing of gait and postural instability, per neuro note in Jan 2024. He does have a history of orthostatic hypotension that has not given him problems recently. He has not fallen in the last six months.   He presented to the ED from his PCP office with LE edema, new onset afib and shortness of breath. CXR with findings suggestive of CHF with pulmonary edema and small bilateral pleural effusions. ECHO 1/12 showed LVEF 25-30%, regional wall motion abnormalities, RV low normal. 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 Recommended medical management of CAD. Normal right heart pressures.  Current HF Medications: MRA: spironolactone 12.5 mg daily SGLT2i: Farxiga 10 mg daily  Prior to admission HF Medications: None  Pertinent Lab Values: Serum creatinine 1.06, BUN 21, Potassium 4.3, Sodium 137, BNP 1095.4, Magnesium 2.1, A1c 5.6   Vital Signs: Weight: 149 lbs (admission weight: 155 lbs) Blood pressure: 110/80s  Heart rate: 60-70s  I/O: -1.1L yesterday; net -7.9L  Medication Assistance / Insurance Benefits Check: Does the patient have prescription insurance?  Yes Type of insurance plan: [facility] Medicare  Outpatient Pharmacy:  Prior to admission outpatient pharmacy: [facility] Is the patient willing to use [facility] at discharge? Yes Is the patient willing to transition their outpatient pharmacy to utilize a [facility] outpatient pharmacy?   Pending    Assessment: 1. 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 NYHA class II symptoms. - Off IV lasix, wedge 13 on  cath. Agree with PRN lasix on discharge. Strict I/Os and daily weights. Keep K>4 and Mg>2. - Consider cautiously adding low dose BB and ARB for HFrEF as outpatient, BP still soft. Will need to be careful with orthostatic hypotension. - Continue spironolactone 12.5 mg daily - Continue Farxiga 10 mg daily   Plan: 1) Medication changes recommended at this time: - None  2) Patient assistance: - Farxiga copay [phone] - patient assistance form initiated and completed by patient  3)  Education  - Patient has been educated on current HF medications and potential additions to HF medication regimen - Patient verbalizes understanding that over the next few months, these medication doses may change and more medications may be added to optimize HF regimen - Patient has been educated on basic disease state pathophysiology and goals of therapy   Kerby Nora, PharmD, BCPS Heart Failure Stewardship Pharmacist Phone [phone]

## 2023-01-14 NOTE — Evaluation (Signed)
Physical Therapy Evaluation Patient Details Name: Mario George MRN: [id number] DOB: April 18, 1941 Today's Date: 01/14/2023  History of Present Illness  Patient is a 82 y/o male who presents to his PCP on 1/11 with LE swelling, SOB and found to be in A-fib with RVR on EKG and sent to ED. CXR- CHF. s/p cardiac cath 1/15. Plan for TEE 1/16? PMH includes OSA and PD.  Clinical Impression  Patient presents with generalized weakness, impaired balance and impaired mobility s/p above. Pt lives at home with his wife and dog and reports being Mod I for ADLs/IADLs at baseline. Pt uses SPC vs RW for ambulation. Today, pt tolerated transfers and gait training with use of RW for support with Min guard-supervision. Eager to get TEE done. Likely will continue to progress well. Will consult mobility tech to increase activity. VSS on RA with HRs in 70-80s bpm. Will follow acutely to maximize independence and mobility prior to return home.     Recommendations for follow up therapy are one component of a multi-disciplinary discharge planning process, led by the attending physician.  Recommendations may be updated based on patient status, additional functional criteria and insurance authorization.  Follow Up Recommendations No PT follow up      Assistance Recommended at Discharge PRN  Patient can return home with the following  A little help with walking and/or transfers;A little help with bathing/dressing/bathroom;Assistance with cooking/housework;Assist for transportation    Equipment Recommendations None recommended by PT  Recommendations for Other Services       Functional Status Assessment Patient has had a recent decline in their functional status and demonstrates the ability to make significant improvements in function in a reasonable and predictable amount of time.     Precautions / Restrictions Precautions Precautions: Fall Restrictions Weight Bearing Restrictions: No      Mobility  Bed  Mobility Overal bed mobility: Needs Assistance Bed Mobility: Supine to Sit     Supine to sit: Supervision, HOB elevated     General bed mobility comments: No assist needed.    Transfers Overall transfer level: Needs assistance Equipment used: Rolling walker (2 wheels) Transfers: Sit to/from Stand Sit to Stand: Min guard           General transfer comment: Min guard to stand from EOB x1, transferred to chair post ambulation.    Ambulation/Gait Ambulation/Gait assistance: Min guard, Min assist Gait Distance (Feet): 250 Feet Assistive device: Rolling walker (2 wheels) Gait Pattern/deviations: Step-through pattern, Decreased stride length, Trunk flexed Gait velocity: decreased Gait velocity interpretation: 1.31 - 2.62 ft/sec, indicative of limited community ambulator   General Gait Details: Slow, with mostly steady gait and use of RW for support. HR in 70s bpm.  Stairs            Wheelchair Mobility    Modified Rankin (Stroke Patients Only)       Balance Overall balance assessment: Mild deficits observed, not formally tested                                           Pertinent Vitals/Pain Pain Assessment Pain Assessment: No/denies pain    Home Living Family/patient expects to be discharged to:: Private residence Living Arrangements: Spouse/significant other Available Help at Discharge: Family;Available PRN/intermittently Type of Home: House Home Access: Level entry       Home Layout: One level Home Equipment: Shower seat - built  in;Rolling Walker (2 wheels);Cane - single point;Rollator (4 wheels)      Prior Function Prior Level of Function : Independent/Modified Independent             Mobility Comments: Uses SPC vs RW, drives. Walks dog ADLs Comments: independent     Hand Dominance   Dominant Hand: Right    Extremity/Trunk Assessment   Upper Extremity Assessment Upper Extremity Assessment: Defer to OT evaluation     Lower Extremity Assessment Lower Extremity Assessment: Generalized weakness (but functional)    Cervical / Trunk Assessment Cervical / Trunk Assessment: Kyphotic  Communication   Communication: No difficulties  Cognition Arousal/Alertness: Awake/alert Behavior During Therapy: WFL for tasks assessed/performed Overall Cognitive Status: Within Functional Limits for tasks assessed                                          General Comments General comments (skin integrity, edema, etc.): VSS on RA, HR in 70-80s bpm.    Exercises     Assessment/Plan    PT Assessment Patient needs continued PT services  PT Problem List Decreased strength;Decreased mobility;Decreased balance;Decreased activity tolerance       PT Treatment Interventions Therapeutic activities;Gait training;Therapeutic exercise;Balance training;Functional mobility training;Patient/family education    PT Goals (Current goals can be found in the Care Plan section)  Acute Rehab PT Goals Patient Stated Goal: to get TEE and go home PT Goal Formulation: With patient Time For Goal Achievement: 01/28/23 Potential to Achieve Goals: Good    Frequency Min 3X/week     Co-evaluation               AM-PAC PT "6 Clicks" Mobility  Outcome Measure Help needed turning from your back to your side while in a flat bed without using bedrails?: A Little Help needed moving from lying on your back to sitting on the side of a flat bed without using bedrails?: A Little Help needed moving to and from a bed to a chair (including a wheelchair)?: A Little Help needed standing up from a chair using your arms (e.g., wheelchair or bedside chair)?: A Little Help needed to walk in hospital room?: A Little Help needed climbing 3-5 steps with a railing? : A Little 6 Click Score: 18    End of Session Equipment Utilized During Treatment: Gait belt Activity Tolerance: Patient tolerated treatment well Patient left: in  chair;with call bell/phone within reach Nurse Communication: Mobility status PT Visit Diagnosis: Muscle weakness (generalized) (M62.81);Difficulty in walking, not elsewhere classified (R26.2)    Time: 4967-5916 PT Time Calculation (min) (ACUTE ONLY): 20 min   Charges:   PT Evaluation $PT Eval Moderate Complexity: 1 Mod          Marisa Severin, PT, DPT Acute Rehabilitation Services Secure chat preferred Office [phone]     Marguarite Arbour A Millenia Waldvogel 01/14/2023, 9:14 AM

## 2023-01-14 NOTE — Progress Notes (Signed)
Rounding Note    Patient Name: Mario George Date of Encounter: 01/14/2023  [place] Cardiologist: Kirk Ruths, MD   Subjective    Cath 1/15 with stable 100% CTO of prox-mid LAD with mild disease elswhere RHC with normal filling pressures with PCWP 13  There was consideration of TEE/DCCV. He has now converted to NSR.  He feels well. He slept well  Inpatient Medications    Scheduled Meds:  [START ON 01/19/2023] amiodarone  200 mg Oral BID   [START ON 01/26/2023] amiodarone  200 mg Oral Daily   amiodarone  400 mg Oral BID   aspirin  81 mg Oral Daily   carbidopa-levodopa  1 tablet Oral QHS   carbidopa-levodopa  2 tablet Oral QID   dapagliflozin propanediol  10 mg Oral Daily   multivitamin with minerals  1 tablet Oral Daily   psyllium  1 packet Oral Daily   sodium chloride flush  3 mL Intravenous Q12H   sodium chloride flush  3 mL Intravenous Q12H   sodium chloride flush  3 mL Intravenous Q12H   spironolactone  12.5 mg Oral Daily   Continuous Infusions:  sodium chloride     sodium chloride     heparin 1,250 Units/hr (01/14/23 0641)   PRN Meds: sodium chloride, sodium chloride, acetaminophen, magnesium hydroxide, ondansetron (ZOFRAN) IV, sodium chloride flush, sodium chloride flush   Vital Signs    Vitals:   01/13/23 1638 01/14/23 0147 01/14/23 0452 01/14/23 0752  BP: 96/61  108/81 (!) 99/59  Pulse: 91  68 64  Resp: 18   18  Temp:  97.8 F (36.6 C) 97.8 F (36.6 C) 97.6 F (36.4 C)  TempSrc:  Oral Oral Oral  SpO2: 98%  99% 99%  Weight:   67.7 kg   Height:        Intake/Output Summary (Last 24 hours) at 01/14/2023 0922 Last data filed at 01/14/2023 0641 Gross per 24 hour  Intake 769.3 ml  Output 1975 ml  Net -1205.7 ml      01/14/2023    4:52 AM 01/13/2023    5:11 AM 01/12/2023    4:08 AM  Last 3 Weights  Weight (lbs) 149 lb 4 oz 151 lb 8 oz 152 lb 5.4 oz  Weight (kg) 67.7 kg 68.72 kg 69.1 kg      Telemetry    NSR - Personally  Reviewed  ECG    No new tracing - Personally Reviewed  Physical Exam   GEN: No acute distress.   Neck: No JVD Cardiac: RRR, no murmurs Respiratory: Clear to auscultation bilaterally. GI: Soft, nontender, non-distended  MS: No edema; No deformity. Warm Neuro:  Nonfocal  Psych: Normal affect   Labs    High Sensitivity Troponin:   Recent Labs  Lab 01/09/23 2018 01/09/23 2213  TROPONINIHS 20* 20*     Chemistry Recent Labs  Lab 01/09/23 2018 01/10/23 0526 01/12/23 0048 01/13/23 0043 01/13/23 0812 01/13/23 0813 01/14/23 0034  NA  --    < > 138 137 141 145 137  K  --    < > 4.0 3.7 3.6 3.3* 4.3  CL  --    < > 102 103  --   --  104  CO2  --    < > 26 27  --   --  26  GLUCOSE  --    < > 97 99  --   --  102*  BUN  --    < >  28* 22  --   --  21  CREATININE  --    < > 1.12 1.16  --   --  1.06  CALCIUM  --    < > 8.3* 8.5*  --   --  8.8*  MG 2.1  --  2.1 2.1  --   --   --   PROT  --   --  6.4* 6.7  --   --   --   ALBUMIN  --   --  3.1* 3.2*  --   --   --   AST  --   --  22 26  --   --   --   ALT  --   --  <5 <5  --   --   --   ALKPHOS  --   --  69 68  --   --   --   BILITOT  --   --  0.7 0.7  --   --   --   GFRNONAA  --    < > >60 >60  --   --  >60  ANIONGAP  --    < > 10 7  --   --  7   < > = values in this interval not displayed.    Lipids  Recent Labs  Lab 01/13/23 0043  CHOL 153  TRIG 73  HDL 47  LDLCALC 91  CHOLHDL 3.3    Hematology Recent Labs  Lab 01/12/23 0048 01/13/23 0043 01/13/23 0812 01/13/23 0813 01/14/23 0034  WBC 5.4 4.8  --   --  4.8  RBC 4.13* 4.29  --   --  4.39  HGB 12.9* 13.1 13.3 12.6* 13.8  HCT 39.6 40.6 39.0 37.0* 41.1  MCV 95.9 94.6  --   --  93.6  MCH 31.2 30.5  --   --  31.4  MCHC 32.6 32.3  --   --  33.6  RDW 14.5 14.3  --   --  14.3  PLT 173 175  --   --  175   Thyroid  Recent Labs  Lab 01/09/23 2018  TSH 3.404    BNP Recent Labs  Lab 01/09/23 1642  BNP 1,095.4*    DDimer No results for input(s): "DDIMER" in  the last 168 hours.   Radiology    VAS US CAROTID  Result Date: 01/13/2023 Carotid Arterial Duplex Study Patient Name:  TANVIR HIPPLE  Date of Exam:   01/13/2023 Medical Rec #: [id number]    Accession #:    [id number] Date of Birth: Dec 25, 1941     Patient Gender: M Patient Age:   62 years Exam Location:  [facility] Procedure:      VAS US CAROTID Referring Phys: A POWELL JR --------------------------------------------------------------------------------  Other Factors:     Paget's disease, atrial fibrillation, CHF, Parkinson's                    disease. Comparison Study:  No prior study Performing Technologist: Sherren Kerns RVS  Examination Guidelines: A complete evaluation includes B-mode imaging, spectral Doppler, color Doppler, and power Doppler as needed of all accessible portions of each vessel. Bilateral testing is considered an integral part of a complete examination. Limited examinations for reoccurring indications may be performed as noted.  Right Carotid Findings: +----------+--------+--------+--------+------------------+--------+           PSV cm/sEDV cm/sStenosisPlaque DescriptionComments +----------+--------+--------+--------+------------------+--------+ CCA Prox  75      19  homogeneous                +----------+--------+--------+--------+------------------+--------+ CCA Distal76      21              homogeneous                +----------+--------+--------+--------+------------------+--------+ ICA Prox  78      26      1-39%   heterogenous               +----------+--------+--------+--------+------------------+--------+ ICA Mid   75      24                                         +----------+--------+--------+--------+------------------+--------+ ICA Distal83      27                                         +----------+--------+--------+--------+------------------+--------+ ECA       147     10                                          +----------+--------+--------+--------+------------------+--------+ +----------+--------+-------+--------+-------------------+           PSV cm/sEDV cmsDescribeArm Pressure (mmHG) +----------+--------+-------+--------+-------------------+ [id number]                                        +----------+--------+-------+--------+-------------------+  Left Carotid Findings: +----------+--------+--------+--------+------------------+------------------+           PSV cm/sEDV cm/sStenosisPlaque DescriptionComments           +----------+--------+--------+--------+------------------+------------------+ CCA Prox  86      12                                intimal thickening +----------+--------+--------+--------+------------------+------------------+ CCA Distal64      13              homogeneous                          +----------+--------+--------+--------+------------------+------------------+ ICA Prox  114     32      1-39%   calcific          Shadowing          +----------+--------+--------+--------+------------------+------------------+ ICA Mid   97      21                                                   +----------+--------+--------+--------+------------------+------------------+ ICA Distal127     42                                                   +----------+--------+--------+--------+------------------+------------------+ ECA       66      2                                                    +----------+--------+--------+--------+------------------+------------------+ +----------+--------+--------+--------+-------------------+  PSV cm/sEDV cm/sDescribeArm Pressure (mmHG) +----------+--------+--------+--------+-------------------+ [id number]                                          +----------+--------+--------+--------+-------------------+ +---------+--------+--+--------+--+ VertebralPSV cm/s37EDV cm/s10  +---------+--------+--+--------+--+   Summary: Right Carotid: Velocities in the right ICA are consistent with a 1-39% stenosis. Left Carotid: Velocities in the left ICA are consistent with a 1-39% stenosis. Vertebrals:  Bilateral vertebral arteries demonstrate antegrade flow. Subclavians: Normal flow hemodynamics were seen in bilateral subclavian              arteries. *See table(s) above for measurements and observations.  Electronically signed by Orlie Pollen on 01/13/2023 at 6:47:42 PM.    Final    CARDIAC CATHETERIZATION  Result Date: 01/13/2023   Prox RCA lesion is 20% stenosed.   Prox LAD lesion is 99% stenosed.   Prox LAD to Mid LAD lesion is 100% stenosed. Chronic total occlusion proximal LAD. The mid and distal vessel fills from right to left collaterals. Large dominant Circumflex with no obstructive disease. Moderate caliber non-dominant RCA with mild plaque. This vessel supplies the mid and distal LAD. LVEDP 11 mmHg PCWP  13 mmHg Recommendations: Medical management of CAD. His LAD is chronically occluded and fills from right to left collaterals. No indication for PCI at this time. Normal right and left pressures.   MR BRAIN WO CONTRAST  Result Date: 01/12/2023 CLINICAL DATA:  Neuro deficit, acute, stroke suspected. Episode of right-sided facial droop lasting approximately 10 minutes yesterday morning. EXAM: MRI HEAD WITHOUT CONTRAST TECHNIQUE: Multiplanar, multiecho pulse sequences of the brain and surrounding structures were obtained without intravenous contrast. COMPARISON:  None Available. FINDINGS: Brain: Artifact from dental hardware obscures portions of the anterior brain. Diffusion-weighted images demonstrate no acute infarct. Mild periventricular and scattered white matter changes are likely within normal limits for age. No acute hemorrhage or mass lesion is present. The ventricles are of normal size. No significant extraaxial fluid collection is present. Deep brain nuclei are within  normal limits. The internal auditory canals are within normal limits. The brainstem and cerebellum are within normal limits. Vascular: Flow is present in the major intracranial arteries. Midline structures are within normal limits. Skull and upper cervical spine: The craniocervical junction is normal. Upper cervical spine is within normal limits. Marrow signal is unremarkable. Sinuses/Orbits: The left paranasal scratched at the left paranasal sinuses are obscured by artifact. The visualized paranasal sinuses and mastoid air cells are clear. Visualized globes and orbits are within normal limits. IMPRESSION: Normal MRI appearance of the brain for age. No acute or focal lesion to explain the patient's symptoms. Portions the brain are obscured by artifact from dental hardware. Electronically Signed   By: San Morelle M.D.   On: 01/12/2023 16:55    Cardiac Studies   TTE 01/10/23: IMPRESSIONS     1. Left ventricular ejection fraction, by estimation, is 25 to 30%. The  left ventricle has severely decreased function. The left ventricle  demonstrates regional wall motion abnormalities (see scoring  diagram/findings for description). Left ventricular  diastolic function could not be evaluated.   2. Right ventricular systolic function is low normal. The right  ventricular size is normal. There is normal pulmonary artery systolic  pressure. The estimated right ventricular systolic pressure is 62.8 mmHg.   3. Right atrial size was mildly dilated.   4. The mitral valve is grossly normal. Mild mitral  valve regurgitation.  No evidence of mitral stenosis.   5. The aortic valve is tricuspid. Aortic valve regurgitation is not  visualized. No aortic stenosis is present.   6. The inferior vena cava is normal in size with <50% respiratory  variability, suggesting right atrial pressure of 8 mmHg.   Conclusion(s)/Recommendation(s): Findings consistent with ischemic  cardiomyopathy.   Patient Profile      82 y.o. male  with past medical history of Parkinson's, Paget's, OSA, BPH, and GERD who presented with SOB and volume overload found to be in Afib with RVR with newly reduced LVEF 25-30% on TTE for which Cardiology was consulted.     Assessment & Plan    #Persistent Afib: Patient presented with worsening SOB with volume overload found to be in new Afib  with RVR. TTE with LVEF 25-30% with akinesis of mid-to-apical septal segments, apical anterior and apex. Was on heparin gtt initially due to need for cath with plans to transition to apixaban long-term. Will arrange for TEE/DCCV prior to discharge.  -Continue amiodarone 400mg  BID x7 days, then 200mg  BID x7 days and then 200mg  daily thereafter -converted to sinus rhythm 1/16 -changed to eliquis 5 mg BID  #Acute Systolic HF: - euvolemic LVEF with akinesis of mid-to-apical septal segments, apical anterior and apex. Given WMA and concern for ischemia contributing to drop in EF, patient underwent RHC/LHC today which showed 100% prox-mid LAD stenosis, 20% RCA. Filling pressures normal. Will plan for continued medical management. -Stop IV lasix given normal filling pressures on cath -Continue farxiga 10mg  daily -Continue spiro 12.5mg  daily -can add BB and low dose ARB as outpatient Bps soft -Plan for prn lasix on discharge for weight gain/edema  #CAD: Cath with 100% prox-mid LAD, 20% RCA. Recommended for medical management. - stable no chest pain -stopped ASA, now  apixaban for AC -Intolerant to statins; can consider PCSK9i as outpatient    #Parkinsons: -Management per primary -Will need to monitor for falls while on [facility]  He is euvolemic. He feels well. He has converted to sinus rhythm. He can be discharged from a cardiology standpoint.  Will work on follow-up   Time Spent Directly with Patient:  I have spent a total of 35 minutes with the patient reviewing hospital notes, telemetry, EKGs, labs and examining the patient as well as  establishing an assessment and plan that was discussed personally with the patient.  > 50% of time was spent in direct patient care.   For questions or updates, please contact [facility] Please consult [email or web address] for contact info under        Signed, , MD  01/14/2023, 9:22 AM

## 2023-01-14 NOTE — Discharge Instructions (Signed)

## 2023-01-14 NOTE — TOC Transition Note (Signed)
Transition of Care [facility]) - CM/SW Discharge Note   Patient Details  Name: Mario George MRN: [id number] Date of Birth: 1941/02/19  Transition of Care [facility] CM/SW Contact:  Zenon Mayo, RN Phone Number: 01/14/2023, 11:47 AM   Clinical Narrative:     Patient is for dc today, has no needs.         Patient Goals and CMS Choice      Discharge Placement                         Discharge Plan and Services Additional resources added to the After Visit Summary for                                       Social Determinants of Health (SDOH) Interventions SDOH Screenings   Food Insecurity: No Food Insecurity (01/14/2023)  Housing: Low Risk  (01/14/2023)  Transportation Needs: No Transportation Needs (01/14/2023)  Alcohol Screen: Low Risk  (01/14/2023)  Depression (PHQ2-9): Low Risk  (11/06/2022)  Financial Resource Strain: Medium Risk (01/14/2023)  Physical Activity: Sufficiently Active (11/06/2022)  Social Connections: Moderately Isolated (11/06/2022)  Stress: No Stress Concern Present (11/06/2022)  Tobacco Use: Medium Risk (01/14/2023)     Readmission Risk Interventions     No data to display

## 2023-01-14 NOTE — Progress Notes (Signed)
[place] for heparin Indication: atrial fibrillation  Allergies  Allergen Reactions   Statins Other (See Comments)    Weakness/myalgia    Other Rash    Had rash on arm after pneumonia and flu vaccine together - never had either separately prior to this reaction.  Zquil - "felt like his skin was crawling"     Patient Measurements: Height: 6\' 1"  (185.4 cm) Weight: 68.7 kg (151 lb 8 oz) IBW/kg (Calculated) : 79.9 HEPARIN DW (KG): 70.5   Vital Signs: BP: 96/61 (01/15 1638) Pulse Rate: 91 (01/15 1638)  Labs: Recent Labs    01/12/23 0048 01/13/23 0043 01/13/23 0812 01/13/23 0813 01/14/23 0034  HGB 12.9* 13.1 13.3 12.6* 13.8  HCT 39.6 40.6 39.0 37.0* 41.1  PLT 173 175  --   --  175  HEPARINUNFRC 0.50 0.42  --   --  0.41  CREATININE 1.12 1.16  --   --  1.06     Estimated Creatinine Clearance: 52.2 mL/min (by C-G formula based on SCr of 1.06 mg/dL).   Medical History: Past Medical History:  Diagnosis Date   BPH with obstruction/lower urinary tract symptoms    orthostatic hypotension on flomax   Chronic hoarseness    Eczema    Legs.  Triamcinolone   GERD (gastroesophageal reflux disease)    OSA (obstructive sleep apnea)    quit cpap in 2017   Osteoarthritis, multiple sites    Paget's disease of bone    Zoledronic acid 2002 and again in 2012.  Monitoring alkaline phosphatase levels.   Parkinson's disease    Diagnosed 2018    Assessment: Patient presents to ED after visit with PCP showed afib with RVR. Patient reports having afib before but he is not on [facility] PTA. Per cardiology plan for TEE guided cardioversion following cath.   Now s/p cath on 1/15 and pharmacy consulted to resume IV heparin 8 hours post sheath removal.  TR band removed around 1309.  No bleeding documented.  1/16 AM update:  Heparin level remains therapeutic after re-start s/p cath  Goal of Therapy:  Heparin level 0.3-0.7 units/ml Monitor platelets by  anticoagulation protocol: Yes  Plan:  Cont heparin 1250 units/hr Daily CBC and heparin level  Monitor for bleeding  Narda Bonds, PharmD, BCPS Clinical Pharmacist Phone: [phone]

## 2023-01-14 NOTE — Telephone Encounter (Signed)
  [place] sent in an effort to reduce the patient's out of pocket expense for Farxiga to $0.    Application completed and faxed to [phone]  [place] phone number for follow up is [phone].   Application scanned into patient chart under Media tab

## 2023-01-14 NOTE — Discharge Summary (Signed)
Physician Discharge Summary  Mario George:[id number] DOB: 12-Feb-1941 DOA: 01/09/2023  PCP: Tammi Sou, MD  Admit date: 01/09/2023 Discharge date: 01/14/2023  Admitted From: Home Discharge disposition: Home  Recommendations at discharge:  You been started on anticoagulation with Eliquis.  Please watch out for any obvious bleeding or easy bruisability Heart failure medicines per cardiology. Follow-up with cardiology as an outpatient   Brief narrative: Mario George is a 82 y.o. male with PMH OSA not on CPAP, GERD, Parkinson's disease, Paget's disease, BPH, essential tremors, PACs 1/11, patient presented to the ED with complaint of grossly worsening bilateral lower extremity edema, cough, fatigue.  He was seen at PCPs office.  EKG showed A-fib with a rate in the low 100s and hence directed to the ED Admitted to [facility] On further workup, patient was noted to have acute systolic CHF with ejection fraction 25 to 30% Cardiology consulted See below for details  Subjective: Patient was seen and examined this morning.  Sitting up in recliner.  Not in distress.  No new symptoms.  With ready to go home.  Wife and daughter at bedside.  Hospital course: A-fib with RVR New diagnosis of A-fib Seen by cardiology. Started on amiodarone.  There was a tentative plan of TEE guided cardioversion tomorrow but patient has converted to normal sinus rhythm.  Per cardiology condition, will discharge the patient on amiodarone: 400mg  BID x7 days, then 200mg  BID x7 days and then 200mg  daily thereafter. Eliquis 5 mg twice daily for anticoagulation.  Acute systolic CHF Ischemic cardiomyopathy Presented with progressively worsening bilateral lower extremity edema and fatigue for few months Echocardiogram showed EF 25 to 30% with regional wall motion abnormalities consistent with ischemic cardiomyopathy 1/15, underwent cardiac cath which showed chronic occlusion of the proximal LAD but it fills from the  collaterals.  Normal RCA and circumflex.  No stent was deployed. Started on Eliquis.  LDL 91.  Not on statin.  Has history of intolerance to statin.  Can consider PCSK9i as an outpatient. Adequately diuresed, negative balance of about 8 L. At discharge, CHF regimen per cardiology:  Farxiga 10 mg daily, Aldactone 12.5 mg daily. Currently blood pressure stopped.  Can add beta-blocker low-dose ARB as an outpatient. Net IO Since Admission: -7,891.41 mL [01/14/23 1234] Recent Labs  Lab 01/09/23 1642 01/09/23 2018 01/10/23 0526 01/11/23 0530 01/12/23 0048 01/13/23 0043 01/13/23 0812 01/13/23 0813 01/14/23 0034  BNP 1,095.4*  --   --   --   --   --   --   --   --   BUN 23  --  18 25* 28* 22  --   --  21  CREATININE 0.94  --  0.99 1.11 1.12 1.16  --   --  1.06  K 4.1  --  3.7 4.0 4.0 3.7 3.6 3.3* 4.3  MG  --  2.1  --   --  2.1 2.1  --   --   --     Acute neurological symptoms 1/13, patient was noted to have right facial droop, dysarthria.  Concern of TIA was raised Workup initiated. MRI brain did not show any acute or focal lesion.  Pending carotid ultrasound. Symptoms gradually resolved. Eliquis plan as above.  Parkinson's Disease  Continue Sinemet   Paget's Disease  Noted, outpatient f/u    BPH Not on any home meds  GERD Not on any home meds   Mobility: Previously used walker and cane at home.  PT eval obtained.  No PT follow-up required.  Goals of care   Code Status: Full Code   Wounds:  - Incision (Closed) 07/14/18 Groin Right (Active)  Date First Assessed/Time First Assessed: 07/14/18 1144   Location: Groin  Location Orientation: Right    Assessments 07/14/2018 12:16 PM 07/14/2018  1:36 PM  Dressing Type Liquid skin adhesive --  Site / Wound Assessment Clean;Dry --  Margins Attached edges (approximated) --  Closure Skin glue --  Drainage Amount None None     No associated orders.    Discharge Exam:   Vitals:   01/14/23 0147 01/14/23 0452 01/14/23 0752  01/14/23 0949  BP:  108/81 (!) 99/59 118/85  Pulse:  68 64 72  Resp:   18 18  Temp: 97.8 F (36.6 C) 97.8 F (36.6 C) 97.6 F (36.4 C)   TempSrc: Oral Oral Oral   SpO2:  99% 99%   Weight:  67.7 kg    Height:        Body mass index is 19.69 kg/m.   General exam: Pleasant, elderly Caucasian male.  Not in physical distress. Skin: No rashes, lesions or ulcers. HEENT: Atraumatic, normocephalic, no obvious bleeding Lungs: Clear to auscultation bilaterally CVS: Rate controlled, no murmur  GI/Abd soft, nontender, nondistended, bowel sound present CNS: Alert, awake, oriented x 3 Psychiatry: Mood appropriate Extremities: No pedal edema, no calf tenderness.  Right wrist post-cath bandage on.  Follow ups:    Follow-up Information     Lenna Sciara, NP Follow up on 01/28/2023.   Specialties: Cardiology, Family Medicine Why: Appointment at 8:25 AM Contact information: [address] [place] [address] [phone]         [place]. Go in 6 day(s).   Specialty: Cardiology Why: Hospital follow up 01/21/2023 @ 12 noon PLEASE bring a current medication list to appointment  FREE valet parking, Entrance C, off [employer] information: [address] [phone] [place]        Tammi Sou, MD .   Specialty: Family Medicine Contact information: [address] [place] [address] [phone]                 Discharge Instructions:   Discharge Instructions     Call MD for:  difficulty breathing, headache or visual disturbances   Complete by: As directed    Call MD for:  extreme fatigue   Complete by: As directed    Call MD for:  hives   Complete by: As directed    Call MD for:  persistant dizziness or light-headedness   Complete by: As directed    Call MD for:  persistant nausea and vomiting   Complete by: As directed     Call MD for:  severe uncontrolled pain   Complete by: As directed    Call MD for:  temperature >100.4   Complete by: As directed    Diet - low sodium heart healthy   Complete by: As directed    Discharge instructions   Complete by: As directed    Recommendations at discharge:   You been started on anticoagulation with Eliquis.  Please watch out for any obvious bleeding or easy bruisability  Heart failure medicines per cardiology.  Follow-up with cardiology as an outpatient  Discharge instructions for CHF Check weight daily -preferably same time every day. Restrict fluid intake to 1200 ml daily Restrict salt intake to less  than 2 g daily. Call MD if you have one of the following symptoms 1) 3 pound weight gain in 24 hours or 5 pounds in 1 week  2) swelling in the hands, feet or stomach  3) progressive shortness of breath 4) if you have to sleep on extra pillows at night in order to breathe     General discharge instructions: Follow with Primary MD McGowen, Maryjean Morn, MD in 7 days  Please request your PCP  to go over your hospital tests, procedures, radiology results at the follow up. Please get your medicines reviewed and adjusted.  Your PCP may decide to repeat certain labs or tests as needed. Do not drive, operate heavy machinery, perform activities at heights, swimming or participation in water activities or provide baby sitting services if your were admitted for syncope or siezures until you have seen by Primary MD or a Neurologist and advised to do so again. [place] Controlled Substance Reporting System database was reviewed. Do not drive, operate heavy machinery, perform activities at heights, swim, participate in water activities or provide baby-sitting services while on medications for pain, sleep and mood until your outpatient physician has reevaluated you and advised to do so again.  You are strongly recommended to comply with the dose, frequency and duration of  prescribed medications. Activity: As tolerated with Full fall precautions use walker/cane & assistance as needed Avoid using any recreational substances like cigarette, tobacco, alcohol, or non-prescribed drug. If you experience worsening of your admission symptoms, develop shortness of breath, life threatening emergency, suicidal or homicidal thoughts you must seek medical attention immediately by calling 911 or calling your MD immediately  if symptoms less severe. You must read complete instructions/literature along with all the possible adverse reactions/side effects for all the medicines you take and that have been prescribed to you. Take any new medicine only after you have completely understood and accepted all the possible adverse reactions/side effects.  Wear Seat belts while driving. You were cared for by a hospitalist during your hospital stay. If you have any questions about your discharge medications or the care you received while you were in the hospital after you are discharged, you can call the unit and ask to speak with the hospitalist or the covering physician. Once you are discharged, your primary care physician will handle any further medical issues. Please note that NO REFILLS for any discharge medications will be authorized once you are discharged, as it is imperative that you return to your primary care physician (or establish a relationship with a primary care physician if you do not have one).   Increase activity slowly   Complete by: As directed        Discharge Medications:   Allergies as of 01/14/2023       Reactions   Statins Other (See Comments)   Weakness/myalgia   Other Rash   Had rash on arm after pneumonia and flu vaccine together - never had either separately prior to this reaction.  Zquil - "felt like his skin was crawling"         Medication List     TAKE these medications    acetaminophen 325 MG tablet Commonly known as: TYLENOL Take 650 mg by  mouth as needed for moderate pain.   amiodarone 200 MG tablet Commonly known as: PACERONE Take 2 tablets (400 mg) 2 times daily for 4 days, THEN take 1 tablet (200 mg) by mouth 2 times daily for 6 days, THEN take 1  tablet daily.   apixaban 5 MG Tabs tablet Commonly known as: ELIQUIS Take 1 tablet (5 mg total) by mouth 2 (two) times daily.   carbidopa-levodopa 25-100 MG tablet Commonly known as: SINEMET IR Take 2 tablets by mouth 4 (four) times daily. What changed: when to take this   carbidopa-levodopa 50-200 MG tablet Commonly known as: SINEMET CR TAKE 1 TABLET BY MOUTH AT  BEDTIME What changed: Another medication with the same name was changed. Make sure you understand how and when to take each.   dapagliflozin propanediol 10 MG Tabs tablet Commonly known as: FARXIGA Take 1 tablet (10 mg total) by mouth daily. Start taking on: January 15, 2023   furosemide 40 MG tablet Commonly known as: Lasix Take 1 tablet (40 mg total) by mouth daily as needed for edema or fluid.   HYDROcodone bit-homatropine 5-1.5 MG/5ML syrup Commonly known as: HYCODAN 1-2 tsp po bid prn cough   METAMUCIL PO Take 10-15 mLs by mouth daily.   MULTIVITAMIN ADULT PO Take 2 tablets by mouth daily.   spironolactone 25 MG tablet Commonly known as: ALDACTONE Take 0.5 tablets (12.5 mg total) by mouth daily. Start taking on: January 15, 2023   SYSTANE OP Place 1 drop into both eyes as needed (dryness).   vitamin C 1000 MG tablet Take 1,000 mg by mouth daily.         The results of significant diagnostics from this hospitalization (including imaging, microbiology, ancillary and laboratory) are listed below for reference.    Procedures and Diagnostic Studies:   ECHOCARDIOGRAM COMPLETE  Result Date: 01/10/2023    ECHOCARDIOGRAM REPORT   Patient Name:   Mario George Date of Exam: 01/10/2023 Medical Rec #:  [id number]   Height:       72.0 in Accession #:    [id number]  Weight:       168.0 lb Date of  Birth:  [birth year]    BSA:          1.978 m Patient Age:    82 years    BP:           113/94 mmHg Patient Gender: M           HR:           90 bpm. Exam Location:  Inpatient Procedure: 2D Echo, Cardiac Doppler and Color Doppler Indications:    CHF-Acute Systolic I50.21  History:        Patient has no prior history of Echocardiogram examinations.                 Risk Factors:Sleep Apnea. Parkinson's disease. Paget's disease.  Sonographer:    Leta Jungling RDCS Referring Phys: [phone] DEBBY CROSLEY IMPRESSIONS  1. Left ventricular ejection fraction, by estimation, is 25 to 30%. The left ventricle has severely decreased function. The left ventricle demonstrates regional wall motion abnormalities (see scoring diagram/findings for description). Left ventricular diastolic function could not be evaluated.  2. Right ventricular systolic function is low normal. The right ventricular size is normal. There is normal pulmonary artery systolic pressure. The estimated right ventricular systolic pressure is 23.8 mmHg.  3. Right atrial size was mildly dilated.  4. The mitral valve is grossly normal. Mild mitral valve regurgitation. No evidence of mitral stenosis.  5. The aortic valve is tricuspid. Aortic valve regurgitation is not visualized. No aortic stenosis is present.  6. The inferior vena cava is normal in size with <50% respiratory variability, suggesting right atrial pressure of 8  mmHg. Conclusion(s)/Recommendation(s): Findings consistent with ischemic cardiomyopathy. FINDINGS  Left Ventricle: Left ventricular ejection fraction, by estimation, is 25 to 30%. The left ventricle has severely decreased function. The left ventricle demonstrates regional wall motion abnormalities. The left ventricular internal cavity size was normal  in size. There is no left ventricular hypertrophy. Left ventricular diastolic function could not be evaluated due to atrial fibrillation. Left ventricular diastolic function could not be evaluated.  LV  Wall Scoring: The mid and distal anterior septum, mid inferoseptal segment, apical anterior segment, and apex are akinetic. Right Ventricle: The right ventricular size is normal. No increase in right ventricular wall thickness. Right ventricular systolic function is low normal. There is normal pulmonary artery systolic pressure. The tricuspid regurgitant velocity is 1.99 m/s,  and with an assumed right atrial pressure of 8 mmHg, the estimated right ventricular systolic pressure is 23.8 mmHg. Left Atrium: Left atrial size was normal in size. Right Atrium: Right atrial size was mildly dilated. Pericardium: There is no evidence of pericardial effusion. Mitral Valve: The mitral valve is grossly normal. Mild mitral valve regurgitation. No evidence of mitral valve stenosis. Tricuspid Valve: The tricuspid valve is grossly normal. Tricuspid valve regurgitation is mild . No evidence of tricuspid stenosis. Aortic Valve: The aortic valve is tricuspid. Aortic valve regurgitation is not visualized. No aortic stenosis is present. Pulmonic Valve: The pulmonic valve was not well visualized. Pulmonic valve regurgitation is not visualized. No evidence of pulmonic stenosis. Aorta: The aortic root is normal in size and structure. Venous: The inferior vena cava is normal in size with less than 50% respiratory variability, suggesting right atrial pressure of 8 mmHg. IAS/Shunts: No atrial level shunt detected by color flow Doppler. Additional Comments: There is pleural effusion in both left and right lateral regions.  LEFT VENTRICLE PLAX 2D LVIDd:         4.30 cm      Diastology LVIDs:         3.40 cm      LV e' medial:    6.63 cm/s LV PW:         0.90 cm      LV E/e' medial:  9.8 LV IVS:        0.90 cm      LV e' lateral:   9.52 cm/s LVOT diam:     1.70 cm      LV E/e' lateral: 6.8 LV SV:         18 LV SV Index:   9 LVOT Area:     2.27 cm  LV Volumes (MOD) LV vol d, MOD A2C: 115.0 ml LV vol d, MOD A4C: 92.3 ml LV vol s, MOD A2C: 72.4 ml  LV vol s, MOD A4C: 67.3 ml LV SV MOD A2C:     42.6 ml LV SV MOD A4C:     92.3 ml LV SV MOD BP:      34.8 ml RIGHT VENTRICLE TAPSE (M-mode): 1.9 cm LEFT ATRIUM             Index        RIGHT ATRIUM           Index LA diam:        4.10 cm 2.07 cm/m   RA Area:     19.70 cm LA Vol (A2C):   69.3 ml 35.03 ml/m  RA Volume:   54.40 ml  27.50 ml/m LA Vol (A4C):   54.1 ml 27.35 ml/m LA Biplane Vol: 61.5 ml 31.09  ml/m  AORTIC VALVE LVOT Vmax:   55.80 cm/s LVOT Vmean:  39.400 cm/s LVOT VTI:    0.080 m  AORTA Ao Root diam: 3.10 cm MITRAL VALVE               TRICUSPID VALVE MV Area (PHT): 8.37 cm    TR Peak grad:   15.8 mmHg MV Decel Time: 91 msec     TR Vmax:        199.00 cm/s MR Peak grad: 66.3 mmHg MR Mean grad: 42.0 mmHg    SHUNTS MR Vmax:      407.00 cm/s  Systemic VTI:  0.08 m MR Vmean:     309.0 cm/s   Systemic Diam: 1.70 cm MV E velocity: 64.70 cm/s Lennie Odor MD Electronically signed by Lennie Odor MD Signature Date/Time: 01/10/2023/1:27:03 PM    Final    DG Chest 2 View  Result Date: 01/09/2023 CLINICAL DATA:  Shortness of breath, leg swelling EXAM: CHEST - 2 VIEW COMPARISON:  None Available. FINDINGS: Heart size is upper limits of normal. Aortic atherosclerosis. Pulmonary vascular congestion. Mild diffuse interstitial prominence with patchy right basilar opacity. Small bilateral pleural effusions. No pneumothorax. IMPRESSION: Findings suggestive of CHF with pulmonary edema and small bilateral pleural effusions. Electronically Signed   By: Duanne Guess D.O.   On: 01/09/2023 17:04     Labs:   Basic Metabolic Panel: Recent Labs  Lab 01/09/23 2018 01/10/23 0526 01/11/23 0530 01/12/23 0048 01/13/23 0043 01/13/23 0812 01/13/23 0813 01/14/23 0034  NA  --  137 138 138 137 141 145 137  K  --  3.7 4.0 4.0 3.7 3.6 3.3* 4.3  CL  --  102 103 102 103  --   --  104  CO2  --  23 24 26 27   --   --  26  GLUCOSE  --  89 102* 97 99  --   --  102*  BUN  --  18 25* 28* 22  --   --  21  CREATININE   --  0.99 1.11 1.12 1.16  --   --  1.06  CALCIUM  --  8.5* 8.4* 8.3* 8.5*  --   --  8.8*  MG 2.1  --   --  2.1 2.1  --   --   --   PHOS  --   --   --  3.9 3.8  --   --   --    GFR Estimated Creatinine Clearance: 51.4 mL/min (by C-G formula based on SCr of 1.06 mg/dL). Liver Function Tests: Recent Labs  Lab 01/12/23 0048 01/13/23 0043  AST 22 26  ALT <5 <5  ALKPHOS 69 68  BILITOT 0.7 0.7  PROT 6.4* 6.7  ALBUMIN 3.1* 3.2*   No results for input(s): "LIPASE", "AMYLASE" in the last 168 hours. No results for input(s): "AMMONIA" in the last 168 hours. Coagulation profile No results for input(s): "INR", "PROTIME" in the last 168 hours.  CBC: Recent Labs  Lab 01/09/23 1642 01/10/23 0526 01/11/23 0530 01/12/23 0048 01/13/23 0043 01/13/23 0812 01/13/23 0813 01/14/23 0034  WBC 4.4 5.0 5.6 5.4 4.8  --   --  4.8  NEUTROABS 2.7 3.2  --  2.7 2.3  --   --   --   HGB 12.5* 13.2 14.4 12.9* 13.1 13.3 12.6* 13.8  HCT 38.1* 39.6 42.5 39.6 40.6 39.0 37.0* 41.1  MCV 96.5 93.8 94.0 95.9 94.6  --   --  93.6  PLT 190 192 179 173 175  --   --  175   Cardiac Enzymes: No results for input(s): "CKTOTAL", "CKMB", "CKMBINDEX", "TROPONINI" in the last 168 hours. BNP: Invalid input(s): "POCBNP" CBG: No results for input(s): "GLUCAP" in the last 168 hours. D-Dimer No results for input(s): "DDIMER" in the last 72 hours. Hgb A1c Recent Labs    01/13/23 0043  HGBA1C 5.6   Lipid Profile Recent Labs    01/13/23 0043  CHOL 153  HDL 47  LDLCALC 91  TRIG 73  CHOLHDL 3.3   Thyroid function studies No results for input(s): "TSH", "T4TOTAL", "T3FREE", "THYROIDAB" in the last 72 hours.  Invalid input(s): "FREET3" Anemia work up No results for input(s): "VITAMINB12", "FOLATE", "FERRITIN", "TIBC", "IRON", "RETICCTPCT" in the last 72 hours. Microbiology Recent Results (from the past 240 hour(s))  Resp panel by RT-PCR (RSV, Flu A&B, Covid) Anterior Nasal Swab     Status: None   Collection  Time: 01/09/23  8:26 PM   Specimen: Anterior Nasal Swab  Result Value Ref Range Status   SARS Coronavirus 2 by RT PCR NEGATIVE NEGATIVE Final    Comment: (NOTE) SARS-CoV-2 target nucleic acids are NOT DETECTED.  The SARS-CoV-2 RNA is generally detectable in upper respiratory specimens during the acute phase of infection. The lowest concentration of SARS-CoV-2 viral copies this assay can detect is 138 copies/mL. A negative result does not preclude SARS-Cov-2 infection and should not be used as the sole basis for treatment or other patient management decisions. A negative result may occur with  improper specimen collection/handling, submission of specimen other than nasopharyngeal swab, presence of viral mutation(s) within the areas targeted by this assay, and inadequate number of viral copies(<138 copies/mL). A negative result must be combined with clinical observations, patient history, and epidemiological information. The expected result is Negative.  Fact Sheet for Patients:  BloggerCourse.com  Fact Sheet for Healthcare Providers:  SeriousBroker.it  This test is no t yet approved or cleared by the [place] FDA and  has been authorized for detection and/or diagnosis of SARS-CoV-2 by FDA under an Emergency Use Authorization (EUA). This EUA will remain  in effect (meaning this test can be used) for the duration of the COVID-19 declaration under Section 564(b)(1) of the Act, 21 U.S.C.section 360bbb-3(b)(1), unless the authorization is terminated  or revoked sooner.       Influenza A by PCR NEGATIVE NEGATIVE Final   Influenza B by PCR NEGATIVE NEGATIVE Final    Comment: (NOTE) The Xpert Xpress SARS-CoV-2/FLU/RSV plus assay is intended as an aid in the diagnosis of influenza from Nasopharyngeal swab specimens and should not be used as a sole basis for treatment. Nasal washings and aspirates are unacceptable for Xpert Xpress  SARS-CoV-2/FLU/RSV testing.  Fact Sheet for Patients: BloggerCourse.com  Fact Sheet for Healthcare Providers: SeriousBroker.it  This test is not yet approved or cleared by the [place] FDA and has been authorized for detection and/or diagnosis of SARS-CoV-2 by FDA under an Emergency Use Authorization (EUA). This EUA will remain in effect (meaning this test can be used) for the duration of the COVID-19 declaration under Section 564(b)(1) of the Act, 21 U.S.C. section 360bbb-3(b)(1), unless the authorization is terminated or revoked.     Resp Syncytial Virus by PCR NEGATIVE NEGATIVE Final    Comment: (NOTE) Fact Sheet for Patients: BloggerCourse.com  Fact Sheet for Healthcare Providers: SeriousBroker.it  This test is not yet approved or cleared by the [place] FDA and has been authorized for detection  and/or diagnosis of SARS-CoV-2 by FDA under an Emergency Use Authorization (EUA). This EUA will remain in effect (meaning this test can be used) for the duration of the COVID-19 declaration under Section 564(b)(1) of the Act, 21 U.S.C. section 360bbb-3(b)(1), unless the authorization is terminated or revoked.  Performed at [facility], [address], [place], [place] [address]     Time coordinating discharge: 35 minutes  Signed: Daveyon Kitchings  [facility] 01/14/2023, 12:34 PM

## 2023-01-15 ENCOUNTER — Encounter (HOSPITAL_COMMUNITY): Payer: Self-pay

## 2023-01-15 ENCOUNTER — Telehealth: Payer: Self-pay

## 2023-01-15 NOTE — Patient Outreach (Signed)
  Care Coordination TOC Note Transition Care Management Follow-up Telephone Call Date of discharge and from where: 01/14/23-[facility]  Dx: "A-fib" How have you been since you were released from the hospital? Patient states he is "doing good." He rested well last night and just finished eating a good breakfast. Denies any pain or cardiac sxs. States he had a BM this morning.  Any questions or concerns? Yes-Patient wanting to know when he can resume driving. Advised patient to wait until MD follow up and cleared by MD. He voiced understanding. He will get daughter to drive him to PCP appt.  Items Reviewed: Did the pt receive and understand the discharge instructions provided? Yes  Medications obtained and verified? Yes  Other? No  Any new allergies since your discharge? No  Dietary orders reviewed? Yes Do you have support at home? Yes   Home Care and Equipment/Supplies: Were home health services ordered? not applicable If so, what is the name of the agency? N/A  Has the agency set up a time to come to the patient's home? not applicable Were any new equipment or medical supplies ordered?  No What is the name of the medical supply agency? N/A Were you able to get the supplies/equipment? not applicable Do you have any questions related to the use of the equipment or supplies? No  Functional Questionnaire: (I = Independent and D = Dependent) ADLs: I  Bathing/Dressing- I  Meal Prep- I  Eating- I  Maintaining continence- I  Transferring/Ambulation- I  Managing Meds- I  Follow up appointments reviewed:  PCP Hospital f/u appt confirmed? Yes  Scheduled to see Dr. Anitra Lauth on 01/17/23 @ 11:20 am. [facility] f/u appt confirmed? Yes  Scheduled to see Terri Piedra on 01/28/23 @ 8:25 am, [facility] 01/21/23 @ 12N. Are transportation arrangements needed? No  If their condition worsens, is the pt aware to call PCP or go to the Emergency Dept.? Yes Was the patient  provided with contact information for the PCP's office or ED? Yes Was to pt encouraged to call back with questions or concerns? Yes  SDOH assessments and interventions completed:   Yes SDOH Interventions Today    Flowsheet Row Most Recent Value  SDOH Interventions   Food Insecurity Interventions Intervention Not Indicated  Transportation Interventions Intervention Not Indicated       Care Coordination Interventions:  Education provided    Encounter Outcome:  Pt. Visit Completed    Enzo Montgomery, RN,BSN,CCM [facility] Telephonic Care Management Coordinator Direct Phone: [phone] Toll Free: [phone] Fax: [phone]

## 2023-01-16 SURGERY — ECHOCARDIOGRAM, TRANSESOPHAGEAL
Anesthesia: Monitor Anesthesia Care

## 2023-01-17 ENCOUNTER — Ambulatory Visit (INDEPENDENT_AMBULATORY_CARE_PROVIDER_SITE_OTHER): Payer: Medicare Other | Admitting: Family Medicine

## 2023-01-17 ENCOUNTER — Telehealth: Payer: Self-pay | Admitting: Family Medicine

## 2023-01-17 ENCOUNTER — Encounter: Payer: Self-pay | Admitting: Family Medicine

## 2023-01-17 VITALS — BP 117/73 | HR 62 | Temp 97.7°F | Ht 73.0 in | Wt 154.6 lb

## 2023-01-17 DIAGNOSIS — I5021 Acute systolic (congestive) heart failure: Secondary | ICD-10-CM

## 2023-01-17 DIAGNOSIS — I4891 Unspecified atrial fibrillation: Secondary | ICD-10-CM | POA: Diagnosis not present

## 2023-01-17 LAB — BASIC METABOLIC PANEL
BUN: 24 mg/dL — ABNORMAL HIGH (ref 6–23)
CO2: 30 mEq/L (ref 19–32)
Calcium: 9.1 mg/dL (ref 8.4–10.5)
Chloride: 103 mEq/L (ref 96–112)
Creatinine, Ser: 1.18 mg/dL (ref 0.40–1.50)
GFR: 57.66 mL/min — ABNORMAL LOW (ref >60.00)
Glucose, Bld: 85 mg/dL (ref 70–99)
Potassium: 4.8 mEq/L (ref 3.5–5.1)
Sodium: 141 mEq/L (ref 135–145)

## 2023-01-17 NOTE — Progress Notes (Signed)
01/17/2023  CC: hosp f/u  Patient is a 82 y.o. Caucasian male who presents by his wife for  hospital follow up, specifically [facility] Services face-to-face visit. Dates hospitalized: 1/11 to 01/14/2023 Days since d/c from hospital: 3 Patient was discharged from hospital to home Reason for admission to hospital: New diagnosis A-fib, found to have acute congestive heart failure. Date of interactive (phone) contact with patient and/or caregiver: 01/15/2023  I have reviewed patient's discharge summary plus pertinent specific notes, labs, and imaging from the hospitalization.   Patient converted to sinus rhythm on amiodarone.  He was started on Eliquis.  Echocardiogram showed ejection fraction 25 to 30% with WMA consistent with ischemic cardiomyopathy.  Catheterization on 01/13/2023 showed chronic occlusion of the proximal LAD, filling with collaterals, normal RCA and circumflex.  Medical management recommended.  LDL 91, not started on statin because of history intolerance to statin.  It was recommended to consider PCSK9 inhibitor as an outpatient.  He was diuresed, negative fluid balance of 8 L.  CHF regimen at discharge was Farxiga 10 mg a day, Aldactone 12.5 mg a day, and Lasix as needed.  Beta-blocker and low-dose ARB can be added as an outpatient. Additionally, he had acute right facial droop and dysarthria, concern of TIA.  MRI brain on 01/11/2023 showed no acute abnormality.  Carotid ultrasounds showed no significant stenosis.  Symptoms resolved. Discharge weight 67.7 kg(149 lbs)  CURRENTLY: Mario George feels well.  No lower extremity swelling, palpitations, no shortness of breath, or chest pain. No Lasix has been needed since discharge home.  His weight this morning was 151 pounds without clothes. Denies side effects from medications. No sign of bleeding.  Discharge medications: Amiodarone 200 mg tabs, 2 tabs twice a day for 4 days and then 1 tab twice a day for 6 days, then 1 tab  daily. Eliquis 5 mg twice a day,Sinemet 25-102 tabs 4 times daily, Farxiga 10 mg daily, furosemide 40 mg daily as needed, Hycodan cough syrup, spironolactone one half of the 25 mg tab daily.   Medication reconciliation was done today and patient is taking meds as recommended by discharging hospitalist/specialist.    ROS as above, plus--> no fevers, no wheezing, no cough, no dizziness, no HAs, no rashes, no melena/hematochezia.  No polyuria or polydipsia.  No myalgias or arthralgias.  No focal weakness, paresthesias, or tremors.  No acute vision or hearing abnormalities.  No dysuria or unusual/new urinary urgency or frequency.  No recent changes in lower legs. No n/v/d or abd pain.  No palpitations.    PMH:  Past Medical History:  Diagnosis Date   Atrial fibrillation (HCC)    12/2022->converted in hosp on amio.  Eliquis.   BPH with obstruction/lower urinary tract symptoms    orthostatic hypotension on flomax   CAD (coronary artery disease)    Cath 12/2022: chronic LAD occlusion, no stent   Chronic hoarseness    Eczema    Legs.  Triamcinolone   GERD (gastroesophageal reflux disease)    Ischemic cardiomyopathy    01/10/23: EF 25-30%, severe LV dysfxn, regional WMA   OSA (obstructive sleep apnea)    quit cpap in 2017   Osteoarthritis, multiple sites    Paget's disease of bone    Zoledronic acid 2002 and again in 2012.  Monitoring alkaline phosphatase levels.   Parkinson's disease    Diagnosed 2018   TIA (transient ischemic attack)    12/2022 (R facial droop, dysarthria) when in hosp for a-fib/chf    PSH:  Past Surgical History:  Procedure Laterality Date   carotid dopplers     12/2022. 1-39% bilat   COLONOSCOPY  2015   2015 normal--no further screening indicated   INGUINAL HERNIA REPAIR Right 07/14/2018   Procedure: OPEN RIGHT INGUINAL HERNIA REPAIR ERAS PATHWAY;  Surgeon: Fanny Skates, MD;  Location: [facility];  Service: General;  Laterality: Right;   INSERTION  OF MESH Right 07/14/2018   Procedure: INSERTION OF MESH;  Surgeon: Fanny Skates, MD;  Location: [facility];  Service: General;  Laterality: Right;   KNEE SURGERY Right 2007   arthroscopic   RIGHT/LEFT HEART CATH AND CORONARY ANGIOGRAPHY N/A 01/13/2023   Chronic LAD occlusion, with collateral flow->med mgmt. Procedure: RIGHT/LEFT HEART CATH AND CORONARY ANGIOGRAPHY;  Surgeon: Burnell Blanks, MD;  Location: [facility];  Service: Cardiovascular;  Laterality: N/A;   TRANSTHORACIC ECHOCARDIOGRAM     01/10/23: EF 25-30%, severe LV dysfxn, regional WMA   VASECTOMY  1975    MEDS:  Outpatient Medications Prior to Visit  Medication Sig Dispense Refill   acetaminophen (TYLENOL) 325 MG tablet Take 650 mg by mouth as needed for moderate pain.     amiodarone (PACERONE) 200 MG tablet Take 2 tablets (400 mg) 2 times daily for 4 days, THEN take 1 tablet (200 mg) by mouth 2 times daily for 6 days, THEN take 1 tablet daily. 48 tablet 0   apixaban (ELIQUIS) 5 MG TABS tablet Take 1 tablet (5 mg total) by mouth 2 (two) times daily. 60 tablet 2   Ascorbic Acid (VITAMIN C) 1000 MG tablet Take 1,000 mg by mouth daily.     carbidopa-levodopa (SINEMET CR) 50-200 MG tablet TAKE 1 TABLET BY MOUTH AT  BEDTIME 90 tablet 1   carbidopa-levodopa (SINEMET IR) 25-100 MG tablet Take 2 tablets by mouth 4 (four) times daily. (Patient taking differently: Take 2 tablets by mouth every 3 (three) hours.) 720 tablet 0   dapagliflozin propanediol (FARXIGA) 10 MG TABS tablet Take 1 tablet (10 mg total) by mouth daily. 30 tablet 2   furosemide (LASIX) 40 MG tablet Take 1 tablet (40 mg total) by mouth daily as needed for edema or fluid. 30 tablet 2   Multiple Vitamin (MULTIVITAMIN ADULT PO) Take 2 tablets by mouth daily.     Polyethyl Glycol-Propyl Glycol (SYSTANE OP) Place 1 drop into both eyes as needed (dryness).     Psyllium (METAMUCIL PO) Take 10-15 mLs by mouth daily.     spironolactone (ALDACTONE)  25 MG tablet Take 1/2 tablet (12.5 mg total) by mouth daily. 15 tablet 2   HYDROcodone bit-homatropine (HYCODAN) 5-1.5 MG/5ML syrup 1-2 tsp po bid prn cough (Patient not taking: Reported on 01/17/2023) 120 mL 0   No facility-administered medications prior to visit.    Physical Exam    01/17/2023   11:21 AM 01/14/2023    9:49 AM 01/14/2023    7:52 AM  Vitals with BMI  Height 6\' 1"     Weight 154 lbs 10 oz    BMI 51.7    Systolic 616 073 99  Diastolic 73 85 59  Pulse 62 72 64   Gen: Alert, well appearing.  Patient is oriented to person, place, time, and situation. AFFECT: pleasant, lucid thought and speech. CV: RRR, no m/r/g.   LUNGS: CTA bilat, nonlabored resps, good aeration in all lung fields. EXT: no clubbing or cyanosis.  no edema.    Pertinent labs/imaging Last CBC Lab Results  Component Value Date  WBC 4.8 01/14/2023   HGB 13.8 01/14/2023   HCT 41.1 01/14/2023   MCV 93.6 01/14/2023   MCH 31.4 01/14/2023   RDW 14.3 01/14/2023   PLT 175 01/60/1093   Last metabolic panel Lab Results  Component Value Date   GLUCOSE 102 (H) 01/14/2023   NA 137 01/14/2023   K 4.3 01/14/2023   CL 104 01/14/2023   CO2 26 01/14/2023   BUN 21 01/14/2023   CREATININE 1.06 01/14/2023   GFRNONAA >60 01/14/2023   CALCIUM 8.8 (L) 01/14/2023   PHOS 3.8 01/13/2023   PROT 6.7 01/13/2023   ALBUMIN 3.2 (L) 01/13/2023   BILITOT 0.7 01/13/2023   ALKPHOS 68 01/13/2023   AST 26 01/13/2023   ALT <5 01/13/2023   ANIONGAP 7 01/14/2023   Last lipids Lab Results  Component Value Date   CHOL 153 01/13/2023   HDL 47 01/13/2023   LDLCALC 91 01/13/2023   TRIG 73 01/13/2023   CHOLHDL 3.3 01/13/2023   Last hemoglobin A1c Lab Results  Component Value Date   HGBA1C 5.6 01/13/2023   Last thyroid functions Lab Results  Component Value Date   TSH 3.404 01/09/2023   ASSESSMENT/PLAN:  #1 atrial fibrillation, he converted to sinus rhythm with initiation of amiodarone. He is tapering amiodarone  to a once daily dose of 200 mg. Doing well on Eliquis 5 mg twice daily. Signs/symptoms to call or return for were reviewed and pt expressed understanding.  #2 acute systolic congestive heart failure/ischemic cardiomyopathy. Cath with coronary artery disease that did not require intervention. EF was 25 to 30% on echo. Asymptomatic, weight stable--no diuretics needed at this time. Continue Farxiga 10 mg a day and spironolactone 12.5 mg a day. He will follow-up with cardiology on 01/22/2023 and they I suspect they will consider adding ARB and/or beta-blocker at that time. Basic metabolic panel today.  Medical decision making of moderate complexity was utilized today.  FOLLOW UP: 3 months  Signed:  Crissie Sickles, MD           01/17/2023

## 2023-01-17 NOTE — Telephone Encounter (Signed)
Please advise 

## 2023-01-20 ENCOUNTER — Telehealth (HOSPITAL_COMMUNITY): Payer: Self-pay

## 2023-01-20 NOTE — Progress Notes (Unsigned)
Cardiology Office Note:    Date:  01/22/2023   ID:  Mario George, DOB January 18, 1941, MRN [id number]  PCP:  Mario Massed, MD   [facility] Providers Cardiologist:  Mario Millers, MD Cardiology APP:  Mario Duster, PA { Referring MD: Mario Massed, MD   Chief Complaint  Patient presents with   Follow-up    CHF, AFib    History of Present Illness:    Mario George is a 82 y.o. 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  He was diagnosed with Parkinson's disease in 2018 and follows with neurology.  He walks independently but does have freezing of gait and postural instability.  He has a history of orthostatic hypotension and was on Flomax for BPH which has been discontinued.  Mr. Mario George had no prior cardiac history when cardiology was asked to evaluate during hospitalization 01/10/2023.  He presented with symptoms of hypervolemia and found to be in A-fib RVR.  This was a new diagnosis of A-fib. However, he states he has had Afib in the past but through shared decision making with the patient he was not anticoagulated (does not like taking medications.  Echocardiogram showed an LVEF of 25-30% with regional wall motion abnormality.  Given this new diagnosis of CHF with wall motion abnormality, he underwent right and left heart catheterization on 01/13/2023 which showed CTO of the proximal LAD with collaterals. Medical management was recommended. Normal right and left heart pressures. LVEDP 11 mmHg and wedge was 13 mmHg. Amiodarone was started for rate control and TEE-guided DCCV was planned. However, he converted to NSR and was discharged on 01/14/23 on eliquis. GDMT titrated slowly given orthostatic hypotension.   He was seen by [facility] 01/21/23.   GDMT: 12.5 mg losartan, 12.5 mg spironolactone, 10 mg farxiga, PRN 40  mg lasix  He presents today for hospital follow up. BMP stable yesterday and he was able to start low dose losartan with plans to repeat another BMP in 2 weeks. Overall, doing well. He and is wife are concentrating on low sodium but also trying to avoid orthostatic hypotension.  I recommended keeping small bottles of Gatorade and protein drinks at home.  We also discussed avoiding canned soups and other high sodium foods.  I think they will need to find a balance which may be a little tricky given ongoing medication titration.   Past Medical History:  Diagnosis Date   Atrial fibrillation (HCC)    12/2022->converted in hosp on amio.  Eliquis.   BPH with obstruction/lower urinary tract symptoms    orthostatic hypotension on flomax   CAD (coronary artery disease)    Cath 12/2022: chronic LAD occlusion, no stent   Chronic hoarseness    Eczema    Legs.  Triamcinolone   GERD (gastroesophageal reflux disease)    Ischemic cardiomyopathy    01/10/23: EF 25-30%, severe LV dysfxn, regional WMA   OSA (obstructive sleep apnea)    quit cpap in 2017   Osteoarthritis, multiple sites    Paget's disease of bone    Zoledronic acid 2002 and again in 2012.  Monitoring alkaline phosphatase levels.   Parkinson's disease    Diagnosed 2018   TIA (transient ischemic attack)    12/2022 (R facial droop, dysarthria) when in hosp for a-fib/chf    Past Surgical History:  Procedure Laterality Date  carotid dopplers     12/2022. 1-39% bilat   COLONOSCOPY  2015   2015 normal--no further screening indicated   INGUINAL HERNIA REPAIR Right 07/14/2018   Procedure: OPEN RIGHT INGUINAL HERNIA REPAIR ERAS PATHWAY;  Surgeon: Fanny Skates, MD;  Location: [facility];  Service: General;  Laterality: Right;   INSERTION OF MESH Right 07/14/2018   Procedure: INSERTION OF MESH;  Surgeon: Fanny Skates, MD;  Location: [facility];  Service: General;  Laterality: Right;   KNEE SURGERY Right 2007    arthroscopic   RIGHT/LEFT HEART CATH AND CORONARY ANGIOGRAPHY N/A 01/13/2023   Chronic LAD occlusion, with collateral flow->med mgmt. Procedure: RIGHT/LEFT HEART CATH AND CORONARY ANGIOGRAPHY;  Surgeon: Burnell Blanks, MD;  Location: [facility];  Service: Cardiovascular;  Laterality: N/A;   TRANSTHORACIC ECHOCARDIOGRAM     01/10/23: EF 25-30%, severe LV dysfxn, regional WMA   VASECTOMY  1975    Current Medications: Current Meds  Medication Sig   acetaminophen (TYLENOL) 325 MG tablet Take 650 mg by mouth as needed for moderate pain.   amiodarone (PACERONE) 200 MG tablet Take 1 tablet (200 mg total) by mouth daily.   carbidopa-levodopa (SINEMET CR) 50-200 MG tablet TAKE 1 TABLET BY MOUTH AT  BEDTIME   carbidopa-levodopa (SINEMET IR) 25-100 MG tablet Take 2 tablets by mouth 4 (four) times daily.   furosemide (LASIX) 40 MG tablet Take 1 tablet (40 mg total) by mouth daily as needed for edema or fluid.   Multiple Vitamin (MULTIVITAMIN ADULT PO) Take 2 tablets by mouth daily.   Psyllium (METAMUCIL PO) Take 10-15 mLs by mouth daily.   [DISCONTINUED] amiodarone (PACERONE) 200 MG tablet Take 2 tablets (400 mg) 2 times daily for 4 days, THEN take 1 tablet (200 mg) by mouth 2 times daily for 6 days, THEN take 1 tablet daily.   [DISCONTINUED] apixaban (ELIQUIS) 5 MG TABS tablet Take 1 tablet (5 mg total) by mouth 2 (two) times daily.   [DISCONTINUED] dapagliflozin propanediol (FARXIGA) 10 MG TABS tablet Take 1 tablet (10 mg total) by mouth daily.   [DISCONTINUED] losartan (COZAAR) 25 MG tablet Take 0.5 tablets (12.5 mg total) by mouth daily.   [DISCONTINUED] spironolactone (ALDACTONE) 25 MG tablet Take 1/2 tablet (12.5 mg total) by mouth daily.     Allergies:   Statins and Other   Social History   Socioeconomic History   Marital status: Married    Spouse name: Mario George   Number of children: 3   Years of education: Not on file   Highest education level: Bachelor's degree (e.g.,  BA, AB, BS)  Occupational History   Occupation: retired    Comment: health care admin/hospital  Tobacco Use   Smoking status: Former    Types: Cigarettes    Quit date: 12/30/1982    Years since quitting: 40.0   Smokeless tobacco: Never  Vaping Use   Vaping Use: Never used  Substance and Sexual Activity   Alcohol use: Yes    Comment: one drink (wine or beer) daily   Drug use: No   Sexual activity: Not Currently  Other Topics Concern   Not on file  Social History Narrative   Married, several children, also grandchildren   Originally from [place].     Moved to [facility] April 2023.   Educ: College   Occup: retired Programmer, applications   No T/A/Ds   Social Determinants of Health   Financial Resource Strain: Medium Risk (01/14/2023)   Overall  Financial Resource Strain (CARDIA)    Difficulty of Paying Living Expenses: Somewhat hard  Food Insecurity: No Food Insecurity (01/15/2023)   Hunger Vital Sign    Worried About Running Out of Food in the Last Year: Never true    Ran Out of Food in the Last Year: Never true  Transportation Needs: No Transportation Needs (01/15/2023)   PRAPARE - Hydrologist (Medical): No    Lack of Transportation (Non-Medical): No  Physical Activity: Sufficiently Active (11/06/2022)   Exercise Vital Sign    Days of Exercise per Week: 5 days    Minutes of Exercise per Session: 30 min  Stress: No Stress Concern Present (11/06/2022)   [place]    Feeling of Stress : Not at all  Social Connections: Moderately Isolated (11/06/2022)   Social Connection and Isolation Panel [NHANES]    Frequency of Communication with Friends and Family: More than three times a week    Frequency of Social Gatherings with Friends and Family: Once a week    Attends Religious Services: Never    Marine scientist or Organizations: No    Attends Music therapist: Never     Marital Status: Married     Family History: The patient's family history includes Alcohol abuse in his father and mother; Arthritis in his father and paternal grandmother; Cancer in his mother; Early death in his child and mother; Kidney disease in his sister; Liver disease in his father; Stroke in his brother, sister, and sister.  ROS:   Please see the history of present illness.     All other systems reviewed and are negative.  EKGs/Labs/Other Studies Reviewed:    The following studies were reviewed today:  R/L [facility] 01/13/23:   Prox RCA lesion is 20% stenosed.   Prox LAD lesion is 99% stenosed.   Prox LAD to Mid LAD lesion is 100% stenosed.   Chronic total occlusion proximal LAD. The mid and distal vessel fills from right to left collaterals.  Large dominant Circumflex with no obstructive disease.  Moderate caliber non-dominant RCA with mild plaque. This vessel supplies the mid and distal LAD.  LVEDP 11 mmHg PCWP  13 mmHg   Recommendations: Medical management of CAD. His LAD is chronically occluded and fills from right to left collaterals. No indication for PCI at this time. Normal right and left pressures.     Echo 01/10/23  1. Left ventricular ejection fraction, by estimation, is 25 to 30%. The  left ventricle has severely decreased function. The left ventricle  demonstrates regional wall motion abnormalities (see scoring  diagram/findings for description). Left ventricular  diastolic function could not be evaluated.   2. Right ventricular systolic function is low normal. The right  ventricular size is normal. There is normal pulmonary artery systolic  pressure. The estimated right ventricular systolic pressure is 62.2 mmHg.   3. Right atrial size was mildly dilated.   4. The mitral valve is grossly normal. Mild mitral valve regurgitation.  No evidence of mitral stenosis.   5. The aortic valve is tricuspid. Aortic valve regurgitation is not  visualized. No aortic stenosis is  present.   6. The inferior vena cava is normal in size with <50% respiratory  variability, suggesting right atrial pressure of 8 mmHg.   EKG:  EKG is  ordered today.  The ekg ordered today demonstrates sinus bradycardia HR 59, anterior Q waves  Recent Labs: 01/09/2023:  TSH 3.404 01/13/2023: ALT <5; Magnesium 2.1 01/21/2023: B Natriuretic Peptide 700.1; BUN 22; Creatinine, Ser 1.20; Hemoglobin 15.8; Platelets 197; Potassium 4.5; Sodium 136  Recent Lipid Panel    Component Value Date/Time   CHOL 153 01/13/2023 0043   TRIG 73 01/13/2023 0043   HDL 47 01/13/2023 0043   CHOLHDL 3.3 01/13/2023 0043   VLDL 15 01/13/2023 0043   LDLCALC 91 01/13/2023 0043     Risk Assessment/Calculations:    CHA2DS2-VASc Score = 3   This indicates a 3.2% annual risk of stroke. The patient's score is based upon: CHF History: 1 HTN History: 0 Diabetes History: 0 Stroke History: 0 Vascular Disease History: 0 Age Score: 2 Gender Score: 0            Physical Exam:    VS:  BP 118/62   Pulse (!) 57   Ht 6\' 1"  (1.854 m)   Wt 147 lb 12.8 oz (67 kg)   SpO2 95%   BMI 19.50 kg/m     Wt Readings from Last 3 Encounters:  01/22/23 147 lb 12.8 oz (67 kg)  01/21/23 147 lb 12.8 oz (67 kg)  01/17/23 154 lb 9.6 oz (70.1 kg)     GEN:  Well nourished, well developed in no acute distress HEENT: Normal NECK: No JVD; No carotid bruits LYMPHATICS: No lymphadenopathy CARDIAC: RRR, no murmurs, rubs, gallops RESPIRATORY:  Clear to auscultation without rales, wheezing or rhonchi  ABDOMEN: Soft, non-tender, non-distended MUSCULOSKELETAL:  No edema; No deformity  SKIN: Warm and dry NEUROLOGIC:  Alert and oriented x 3 PSYCHIATRIC:  Normal affect   ASSESSMENT:    1. Paroxysmal atrial fibrillation (HCC)   2. Coronary artery disease involving native coronary artery of native heart without angina pectoris   3. Chronic anticoagulation   4. On amiodarone therapy   5. HFrEF (heart failure with reduced ejection  fraction) (HCC)   6. Hyperlipidemia with target LDL less than 70   7. Parkinson's disease, unspecified whether dyskinesia present, unspecified whether manifestations fluctuate    PLAN:    In order of problems listed above:  Afib RVR PAF EKG today with sinus bradycardia Remains on amiodarone Hold BB given orthostatic hypotension   Amiodarone therapy TSH WNL (3.404) CXR at admission Taper: He understands to taper to 200 mg daily   Chronic anticoagulation No signs of active bleeding on eliquis Will need to watch weight (67 kg)   Chronic systolic heart failure LVEF 25-30% GDMT as above Will not titrate medication today Patient assistance submitted for farxiga Plan to repeat an echo after 3 months of GDMT and in sinus rhythm   CAD CTO of LAD with collaterals No antiplatelet in the setting of eliquis No BB given orthostatic hypotension No chest pain   Hyperlipidemia with LDL goal < 70 01/13/2023: Cholesterol 153; HDL 47; LDL Cholesterol 91; Triglycerides 73; VLDL 15 No statin due to weakness and myalgias Will refer to lipid pharmD for [id number]   Parkinson's disease Stable on current regimen No falls     Follow up in 1 month Echo likely in 3 months      Medication Adjustments/Labs and Tests Ordered: Current medicines are reviewed at length with the patient today.  Concerns regarding medicines are outlined above.  No orders of the defined types were placed in this encounter.  Meds ordered this encounter  Medications   apixaban (ELIQUIS) 5 MG TABS tablet    Sig: Take 1 tablet (5 mg total) by mouth 2 (two) times daily.  Dispense:  180 tablet    Refill:  3   dapagliflozin propanediol (FARXIGA) 10 MG TABS tablet    Sig: Take 1 tablet (10 mg total) by mouth daily.    Dispense:  90 tablet    Refill:  3   spironolactone (ALDACTONE) 25 MG tablet    Sig: Take 1/2 tablet (12.5 mg total) by mouth daily.    Dispense:  90 tablet    Refill:  3   losartan (COZAAR)  25 MG tablet    Sig: Take 0.5 tablets (12.5 mg total) by mouth daily.    Dispense:  15 tablet    Refill:  3   amiodarone (PACERONE) 200 MG tablet    Sig: Take 1 tablet (200 mg total) by mouth daily.    Dispense:  90 tablet    Refill:  3    Patient Instructions  Medication Instructions:  Your physician recommends that you continue on your current medications as directed. Please refer to the Current Medication list given to you today.  *If you need a refill on your cardiac medications before your next appointment, please call your pharmacy*   Lab Work: NONE If you have labs (blood work) drawn today and your tests are completely normal, you will receive your results only by: MyChart Message (if you have MyChart) OR A paper copy in the mail If you have any lab test that is abnormal or we need to change your treatment, we will call you to review the results.   Testing/Procedures: NONE   Follow-Up: At [facility], you and your health needs are our priority.  As part of our continuing mission to provide you with exceptional heart care, we have created designated Provider Care Teams.  These Care Teams include your primary Cardiologist (physician) and Advanced Practice Providers (APPs -  Physician Assistants and Nurse Practitioners) who all work together to provide you with the care you need, when you need it.  We recommend signing up for the patient portal called "MyChart".  Sign up information is provided on this After Visit Summary.  MyChart is used to connect with patients for Virtual Visits (Telemedicine).  Patients are able to view lab/test results, encounter notes, upcoming appointments, etc.  Non-urgent messages can be sent to your provider as well.   To learn more about what you can do with MyChart, go to [email or web address].    Your next appointment:   1 month(s)  Provider:   Micah Flesher, PA-C or Mario Millers, MD       Signed, Roe Rutherford [place], [place]   01/22/2023 2:53 PM    [facility]

## 2023-01-20 NOTE — Telephone Encounter (Signed)
Called to confirm Heart & Vascular Transitions of Care appointment at 01/21/23. Patient reminded to bring all medications and pill box organizer with them. Confirmed patient has transportation. Gave directions, instructed to utilize valet parking.  Confirmed appointment prior to ending call.   

## 2023-01-21 ENCOUNTER — Encounter (HOSPITAL_COMMUNITY): Payer: Self-pay

## 2023-01-21 ENCOUNTER — Ambulatory Visit
Admit: 2023-01-21 | Discharge: 2023-01-21 | Disposition: A | Discharge status: Home / Self Care | Payer: Medicare Other | Attending: Physician Assistant | Admitting: Physician Assistant

## 2023-01-21 VITALS — BP 110/60 | HR 58 | Wt 147.8 lb

## 2023-01-21 DIAGNOSIS — Z79899 Other long term (current) drug therapy: Secondary | ICD-10-CM | POA: Insufficient documentation

## 2023-01-21 DIAGNOSIS — G4733 Obstructive sleep apnea (adult) (pediatric): Secondary | ICD-10-CM | POA: Diagnosis not present

## 2023-01-21 DIAGNOSIS — I951 Orthostatic hypotension: Secondary | ICD-10-CM

## 2023-01-21 DIAGNOSIS — J3800 Paralysis of vocal cords and larynx, unspecified: Secondary | ICD-10-CM | POA: Insufficient documentation

## 2023-01-21 DIAGNOSIS — E785 Hyperlipidemia, unspecified: Secondary | ICD-10-CM | POA: Diagnosis not present

## 2023-01-21 DIAGNOSIS — G20A1 Parkinson's disease without dyskinesia, without mention of fluctuations: Secondary | ICD-10-CM | POA: Diagnosis not present

## 2023-01-21 DIAGNOSIS — I2510 Atherosclerotic heart disease of native coronary artery without angina pectoris: Secondary | ICD-10-CM

## 2023-01-21 DIAGNOSIS — I480 Paroxysmal atrial fibrillation: Secondary | ICD-10-CM | POA: Diagnosis not present

## 2023-01-21 DIAGNOSIS — Z8673 Personal history of transient ischemic attack (TIA), and cerebral infarction without residual deficits: Secondary | ICD-10-CM | POA: Diagnosis not present

## 2023-01-21 DIAGNOSIS — I428 Other cardiomyopathies: Secondary | ICD-10-CM | POA: Insufficient documentation

## 2023-01-21 DIAGNOSIS — I5020 Unspecified systolic (congestive) heart failure: Secondary | ICD-10-CM

## 2023-01-21 DIAGNOSIS — E782 Mixed hyperlipidemia: Secondary | ICD-10-CM | POA: Diagnosis not present

## 2023-01-21 LAB — CBC
HCT: 47.7 % (ref 39.0–52.0)
Hemoglobin: 15.8 g/dL (ref 13.0–17.0)
MCH: 31.5 pg (ref 26.0–34.0)
MCHC: 33.1 g/dL (ref 30.0–36.0)
MCV: 95 fL (ref 80.0–100.0)
Platelets: 197 10*3/uL (ref 150–400)
RBC: 5.02 MIL/uL (ref 4.22–5.81)
RDW: 13.9 % (ref 11.5–15.5)
WBC: 5.4 10*3/uL (ref 4.0–10.5)
nRBC: 0 % (ref 0.0–0.2)

## 2023-01-21 LAB — BASIC METABOLIC PANEL
Anion gap: 10 (ref 5–15)
BUN: 22 mg/dL (ref 8–23)
CO2: 25 mmol/L (ref 22–32)
Calcium: 9.2 mg/dL (ref 8.9–10.3)
Chloride: 101 mmol/L (ref 98–111)
Creatinine, Ser: 1.2 mg/dL (ref 0.61–1.24)
GFR, Estimated: >60 mL/min (ref >60)
Glucose, Bld: 97 mg/dL (ref 70–99)
Potassium: 4.5 mmol/L (ref 3.5–5.1)
Sodium: 136 mmol/L (ref 135–145)

## 2023-01-21 LAB — BRAIN NATRIURETIC PEPTIDE: B Natriuretic Peptide: 700.1 pg/mL — ABNORMAL HIGH (ref 0.0–100.0)

## 2023-01-21 MED ORDER — LOSARTAN POTASSIUM 25 MG PO TABS: 12.5000 mg | ORAL_TABLET | Freq: Every day | ORAL | 3 refills | Status: DC

## 2023-01-21 NOTE — Progress Notes (Addendum)
HEART & VASCULAR TRANSITION OF CARE CONSULT NOTE     Referring Physician: Dr. Pietro Cassis Primary Care: Dr. Anitra Lauth Primary Cardiologist: Dr. Stanford Breed  HPI: Referred to clinic by Dr. Pietro Cassis with [facility] for heart failure consultation. 82 y.o. male with history of Parkinson's disease, Paget disease of bone, OSA, chronic hoarseness d/t vocal cord paresis, orthostatic hypotension.   Had a respiratory illness associated with fever in December 2023. He was treated with abx. Patient subsequently saw his PCP a month later and had noted worsening lower extremity edema and weight gain.He was found to be in new Afib with RVR and was volume overloaded. He was sent to the ED and admitted for further workup.   Echo during admit: EF 25-30%, akinesis mid to apical septal segments, apical anterior and apex, RV okay, mild MR  Cardiology consulted. He was started on amiodarone and chemically converted to SR. GDMT titrated very slowly d/t soft BP. R/LHC: Normal right and left-sided filling pressures, Fick CO/CI 4.77/2.49, CTO p to mid LAD (mid and distal LAD fills from R to L collaterals). Med management of LAD recommended.  He is here today for hospital follow-up. Home weight shortly after discharge was 152 lb, now down to 145 lb at home. Has not needed to use lasix, prescribed PRN. No significant dyspnea but reports he is not very active. Mobility limited by gait instability. Walks with a cane or walker. No recent falls. No lower extremity edema, orthopnea or PND. Denies CP. He is taking meds as prescribed. No complaints of dizziness. Has not monitored BP since discharge but does have history of hypotension.   Prior hx tobacco use, quit in 1980s.  Lives at home with his wife. Retired from health administration and HR.   Review of Systems: [y] = yes, [ ]  = no   General: Weight gain [ ] ; Weight loss [Y]; Anorexia [ ] ; Fatigue [ ] ; Fever [ ] ; Chills [ ] ; Weakness [ ]   Cardiac: Chest pain/pressure [ ] ; Resting SOB  [ ] ; Exertional SOB [ ] ; Orthopnea [ ] ; Pedal Edema [ ] ; Palpitations [ ] ; Syncope [ ] ; Presyncope [ ] ; Paroxysmal nocturnal dyspnea[ ]   Pulmonary: Cough [ ] ; Wheezing[ ] ; Hemoptysis[ ] ; Sputum [ ] ; Snoring [ ]   GI: Vomiting[ ] ; Dysphagia[ ] ; Melena[ ] ; Hematochezia [ ] ; Heartburn[ ] ; Abdominal pain [ ] ; Constipation [ ] ; Diarrhea [ ] ; BRBPR [ ]   GU: Hematuria[ ] ; Dysuria [ ] ; Nocturia[ ]   Vascular: Pain in legs with walking [ ] ; Pain in feet with lying flat [ ] ; Non-healing sores [ ] ; Stroke [ ] ; TIA [ ] ; Slurred speech [ ] ;  Neuro: Headaches[ ] ; Vertigo[ ] ; Seizures[ ] ; Paresthesias[ ] ;Blurred vision [ ] ; Diplopia [ ] ; Vision changes [ ]   Ortho/Skin: Arthritis [ ] ; Joint pain [ ] ; Muscle pain [ ] ; Joint swelling [ ] ; Back Pain [ ] ; Rash [ ]   Psych: Depression[ ] ; Anxiety[ ]   Heme: Bleeding problems [ ] ; Clotting disorders [ ] ; Anemia [ ]   Endocrine: Diabetes [ ] ; Thyroid dysfunction[ ]    Past Medical History:  Diagnosis Date   Atrial fibrillation ([place])    12/2022->converted in hosp on amio.  Eliquis.   BPH with obstruction/lower urinary tract symptoms    orthostatic hypotension on flomax   CAD (coronary artery disease)    Cath 12/2022: chronic LAD occlusion, no stent   Chronic hoarseness    Eczema    Legs.  Triamcinolone   GERD (gastroesophageal reflux disease)  Ischemic cardiomyopathy    01/10/23: EF 25-30%, severe LV dysfxn, regional WMA   OSA (obstructive sleep apnea)    quit cpap in 2017   Osteoarthritis, multiple sites    Paget's disease of bone    Zoledronic acid 2002 and again in 2012.  Monitoring alkaline phosphatase levels.   Parkinson's disease    Diagnosed 2018   TIA (transient ischemic attack)    12/2022 (R facial droop, dysarthria) when in hosp for a-fib/chf    Current Outpatient Medications  Medication Sig Dispense Refill   acetaminophen (TYLENOL) 325 MG tablet Take 650 mg by mouth as needed for moderate pain.     amiodarone (PACERONE) 200 MG tablet Take 2  tablets (400 mg) 2 times daily for 4 days, THEN take 1 tablet (200 mg) by mouth 2 times daily for 6 days, THEN take 1 tablet daily. 48 tablet 0   apixaban (ELIQUIS) 5 MG TABS tablet Take 1 tablet (5 mg total) by mouth 2 (two) times daily. 60 tablet 2   carbidopa-levodopa (SINEMET CR) 50-200 MG tablet TAKE 1 TABLET BY MOUTH AT  BEDTIME 90 tablet 1   carbidopa-levodopa (SINEMET IR) 25-100 MG tablet Take 2 tablets by mouth 4 (four) times daily. 720 tablet 0   dapagliflozin propanediol (FARXIGA) 10 MG TABS tablet Take 1 tablet (10 mg total) by mouth daily. 30 tablet 2   losartan (COZAAR) 25 MG tablet Take 0.5 tablets (12.5 mg total) by mouth daily. 15 tablet 3   Multiple Vitamin (MULTIVITAMIN ADULT PO) Take 2 tablets by mouth daily.     Psyllium (METAMUCIL PO) Take 10-15 mLs by mouth daily.     spironolactone (ALDACTONE) 25 MG tablet Take 1/2 tablet (12.5 mg total) by mouth daily. 15 tablet 2   furosemide (LASIX) 40 MG tablet Take 1 tablet (40 mg total) by mouth daily as needed for edema or fluid. (Patient not taking: Reported on 01/21/2023) 30 tablet 2   No current facility-administered medications for this encounter.    Allergies  Allergen Reactions   Statins Other (See Comments)    Weakness/myalgia    Other Rash    Had rash on arm after pneumonia and flu vaccine together - never had either separately prior to this reaction.  Zquil - "felt like his skin was crawling"       Social History   Socioeconomic History   Marital status: Married    Spouse name: Sonia Baller   Number of children: 3   Years of education: Not on file   Highest education level: Bachelor's degree (e.g., BA, AB, BS)  Occupational History   Occupation: retired    Comment: health care admin/hospital  Tobacco Use   Smoking status: Former    Types: Cigarettes    Quit date: 12/30/1982    Years since quitting: 40.0   Smokeless tobacco: Never  Vaping Use   Vaping Use: Never used  Substance and Sexual Activity   Alcohol  use: Yes    Comment: one drink (wine or beer) daily   Drug use: No   Sexual activity: Not Currently  Other Topics Concern   Not on file  Social History Narrative   Married, several children, also grandchildren   Originally from [place].     Moved to [facility] April 2023.   Educ: College   Occup: retired Programmer, applications   No T/A/Ds   Social Determinants of Health   Financial Resource Strain: Medium Risk (01/14/2023)   Overall Financial Resource Strain (CARDIA)  Difficulty of Paying Living Expenses: Somewhat hard  Food Insecurity: No Food Insecurity (01/15/2023)   Hunger Vital Sign    Worried About Running Out of Food in the Last Year: Never true    Ran Out of Food in the Last Year: Never true  Transportation Needs: No Transportation Needs (01/15/2023)   PRAPARE - Administrator, Civil Service (Medical): No    Lack of Transportation (Non-Medical): No  Physical Activity: Sufficiently Active (11/06/2022)   Exercise Vital Sign    Days of Exercise per Week: 5 days    Minutes of Exercise per Session: 30 min  Stress: No Stress Concern Present (11/06/2022)   [employer] of Occupational Health - Occupational Stress Questionnaire    Feeling of Stress : Not at all  Social Connections: Moderately Isolated (11/06/2022)   Social Connection and Isolation Panel [NHANES]    Frequency of Communication with Friends and Family: More than three times a week    Frequency of Social Gatherings with Friends and Family: Once a week    Attends Religious Services: Never    Database administrator or Organizations: No    Attends Banker Meetings: Never    Marital Status: Married  Catering manager Violence: Not At Risk (11/06/2022)   Humiliation, Afraid, Rape, and Kick questionnaire    Fear of Current or Ex-Partner: No    Emotionally Abused: No    Physically Abused: No    Sexually Abused: No      Family History  Problem Relation Age of Onset   Cancer Mother         intestinal   Early death Mother    Alcohol abuse Mother    Liver disease Father        ETOH   Alcohol abuse Father    Arthritis Father    Stroke Sister    Kidney disease Sister    Stroke Sister    Stroke Brother    Arthritis Paternal Grandmother    Early death Child     Vitals:   02/14/2023 1221  BP: 110/60  Pulse: (!) 58  SpO2: 99%  Weight: 67 kg (147 lb 12.8 oz)    PHYSICAL EXAM: General:  Thin elderly male, ambulated into clinic with a cane. Wife present. HEENT: normal Neck: supple. no JVD. Carotids 2+ bilat; no bruits.  Cor: PMI nondisplaced. Regular rate & rhythm. No rubs, gallops or murmurs. Lungs: clear Abdomen: soft, nontender, nondistended.  Extremities: no cyanosis, clubbing, rash, edema Neuro: alert & oriented x 3. Affect pleasant.  ECG: SR 61 bpm, anterior Qs   ASSESSMENT & PLAN: HFrEF -Echo 01/10/23: EF 25-30%, RWMA in LAD territory, RV low normal -[facility] 01/13/23: Normal left and right sided filling pressures, Fick CI 2.5, CTO p LAD, m to d LAD fills via collaterals -Suspect CM mixed ischemic/nonischemic. Has CTO LAD on recent cath and RWMA on echo. Suspect Afib may have also been playing a role. Chemically converted during recent admit with amiodarone. Not sure if recent respiratory illness was trigger for Afib.  NYHA II/early III, confounded by functional impairment from Parkinson's disease GDMT  Diuretic-PRN lasix BB-No, HF 50s-low 60s on amiodarone  Ace/ARB/ARNI-Add losartan 12.5 mg daily if BMET today stable. Need to ensure renal function stable given weight loss post discharge. MRA-Spiro 12.5 mg daily SGLT2i- Farixga 10 mg daily. A1c 5.6%. Patient assistance app submitted last week. -Will need slow titration of GDMT. Recommended he monitor BP at home and call if SBP < 100  or he develops dizziness. -Labs today. Will need BMET again in 2 weeks if starts ARB. -Repeat echo to reassess LV function in 3 months  PAF -Diagnosed during recent admission.  Chemically converted with amiodarone. -SR on ECG today -Continue amiodarone taper. Recent LFTs and TSH WNL. -Denies bleeding issues with Eliquis. CBC today  CAD -Cath 01/24 with CTO of LAD as above -Not on aspirin d/t need for anticoagulation -Unable to tolerate statins d/t myalgias.  -No CP since discharge  Orthostatic hypotension -In setting of Parkinson's disease -BP stable today. No dizziness or recent falls. -Monitor as above  HLD -LDL 91 -Not able to tolerate statins. Could consider PCSK9i  Referred to HFSW (PCP, Medications, Transportation, ETOH Abuse, Drug Abuse, Insurance, Financial ): No Refer to Pharmacy: No Refer to Home Health: No Refer to Advanced Heart Failure Clinic: No  Refer to General Cardiology: No  Follow up: TOC as needed, follow-up with Cardiology as scheduled. If EF not improving or condition worsens, can consider referral to AHF clinic.

## 2023-01-21 NOTE — Patient Instructions (Signed)
Medication Changes:  START: losartan. Take 12.5mg  (0.5 tablet), once a day.   Lab Work:  Labs done today, your results will be available in MyChart, we will contact you for abnormal readings.  Special Instructions // Education:  Do the following things EVERYDAY: Weigh yourself in the morning before breakfast. Write it down and keep it in a log. Take your medicines as prescribed Eat low salt foods--Limit salt (sodium) to 2000 mg per day.  Stay as active as you can everyday Limit all fluids for the day to less than 2 liters  Follow-Up in: as needed  General cardiology will need to get labs drawn in about 2 weeks.

## 2023-01-21 NOTE — Addendum Note (Signed)
Encounter addended by: Joette Catching, PA-C on: 01/21/2023 4:33 PM  Actions taken: Clinical Note Signed

## 2023-01-22 ENCOUNTER — Ambulatory Visit: Payer: Medicare Other | Admitting: Physician Assistant

## 2023-01-22 ENCOUNTER — Encounter: Payer: Self-pay | Admitting: Physician Assistant

## 2023-01-22 ENCOUNTER — Telehealth (HOSPITAL_COMMUNITY): Payer: Self-pay

## 2023-01-22 ENCOUNTER — Other Ambulatory Visit (HOSPITAL_COMMUNITY): Payer: Self-pay

## 2023-01-22 VITALS — BP 118/62 | HR 57 | Ht 73.0 in | Wt 147.8 lb

## 2023-01-22 DIAGNOSIS — Z79899 Other long term (current) drug therapy: Secondary | ICD-10-CM

## 2023-01-22 DIAGNOSIS — I5020 Unspecified systolic (congestive) heart failure: Secondary | ICD-10-CM | POA: Diagnosis not present

## 2023-01-22 DIAGNOSIS — Z7901 Long term (current) use of anticoagulants: Secondary | ICD-10-CM

## 2023-01-22 DIAGNOSIS — I2510 Atherosclerotic heart disease of native coronary artery without angina pectoris: Secondary | ICD-10-CM

## 2023-01-22 DIAGNOSIS — E785 Hyperlipidemia, unspecified: Secondary | ICD-10-CM | POA: Diagnosis not present

## 2023-01-22 DIAGNOSIS — I480 Paroxysmal atrial fibrillation: Secondary | ICD-10-CM

## 2023-01-22 DIAGNOSIS — G20A1 Parkinson's disease without dyskinesia, without mention of fluctuations: Secondary | ICD-10-CM

## 2023-01-22 MED ORDER — LOSARTAN POTASSIUM 25 MG PO TABS: 12.5000 mg | ORAL_TABLET | Freq: Every day | ORAL | 3 refills | Status: DC

## 2023-01-22 MED ORDER — APIXABAN 5 MG PO TABS: 5.0000 mg | ORAL_TABLET | Freq: Two times a day (BID) | ORAL | 3 refills | Status: DC

## 2023-01-22 MED ORDER — DAPAGLIFLOZIN PROPANEDIOL 10 MG PO TABS: 10.0000 mg | ORAL_TABLET | Freq: Every day | ORAL | 3 refills | Status: DC

## 2023-01-22 MED ORDER — AMIODARONE HCL 200 MG PO TABS: 200.0000 mg | ORAL_TABLET | Freq: Every day | ORAL | 3 refills | Status: DC

## 2023-01-22 MED ORDER — SPIRONOLACTONE 25 MG PO TABS: 12.5000 mg | ORAL_TABLET | Freq: Every day | ORAL | 3 refills | Status: DC

## 2023-01-22 NOTE — Patient Instructions (Addendum)
Medication Instructions:  Your physician recommends that you continue on your current medications as directed. Please refer to the Current Medication list given to you today.  *If you need a refill on your cardiac medications before your next appointment, please call your pharmacy*   Lab Work: NONE If you have labs (blood work) drawn today and your tests are completely normal, you will receive your results only by: [place] (if you have MyChart) OR A paper copy in the mail If you have any lab test that is abnormal or we need to change your treatment, we will call you to review the results.   Testing/Procedures: NONE   Follow-Up: At [facility], you and your health needs are our priority.  As part of our continuing mission to provide you with exceptional heart care, we have created designated Provider Care Teams.  These Care Teams include your primary Cardiologist (physician) and Advanced Practice Providers (APPs -  Physician Assistants and Nurse Practitioners) who all work together to provide you with the care you need, when you need it.  We recommend signing up for the patient portal called "MyChart".  Sign up information is provided on this After Visit Summary.  MyChart is used to connect with patients for Virtual Visits (Telemedicine).  Patients are able to view lab/test results, encounter notes, upcoming appointments, etc.  Non-urgent messages can be sent to your provider as well.   To learn more about what you can do with MyChart, go to [email or web address].    Your next appointment:   1 month(s)  Provider:   Fabian Sharp, PA-C or Kirk Ruths, MD

## 2023-01-22 NOTE — Telephone Encounter (Signed)
Patient aware and agreeable. He expressed understanding.

## 2023-01-24 ENCOUNTER — Telehealth: Payer: Self-pay | Admitting: Physician Assistant

## 2023-01-24 NOTE — Addendum Note (Signed)
Addended by: Hinton Dyer on: 01/24/2023 10:40 AM   Modules accepted: Orders

## 2023-01-24 NOTE — Telephone Encounter (Signed)
Pt c/o BP issue: STAT if pt c/o blurred vision, one-sided weakness or slurred speech  1. What are your last 5 BP readings? All taken within 15 min of each other 70/42 HR 65, 72/42 HR 64, 73/41 HR 63  2. Are you having any other symptoms (ex. Dizziness, headache, blurred vision, passed out)? Some dizziness  3. What is your BP issue? Patient states his BP is very low this morning. He would like to know how low it needs to be before he would need to call.

## 2023-01-24 NOTE — Telephone Encounter (Signed)
Spoke with patient of Doreene Adas PA who reports low BP today - systolic in the 56P. He reports systolic has been as low as 50s-60s at home. This low has not been recently.   He was started on losartan 12.5mg  daily by Ria Comment PA with CHF clinic on 01/21/23 He also taking spironolactone 12.5mg  daily, amiodarone 200mg  daily, he has not had to use lasix PRN  He saw Angie PA on 01/22/23 - no med changes  Patient would like parameters on how low his BP can be/get before he needs to call in   Will route to CHF team for advice

## 2023-01-27 ENCOUNTER — Telehealth: Payer: Self-pay | Admitting: Cardiology

## 2023-01-27 NOTE — Telephone Encounter (Signed)
Spoke to patient, aware this has be corrected and refaxed.

## 2023-01-27 NOTE — Telephone Encounter (Signed)
Pt c/o medication issue:  1. Name of Medication:  dapagliflozin propanediol (FARXIGA) 10 MG TABS tablet  2. How are you currently taking this medication (dosage and times per day)?   3. Are you having a reaction (difficulty breathing--STAT)?   4. What is your medication issue?   Patient states [employer] had the incorrect spelling of his last name, "Olevia Bowens" instead of [place]. Patient is requesting to have this corrected on the application. Patient provided a fax number for [employer], [phone].

## 2023-01-28 ENCOUNTER — Ambulatory Visit: Payer: Medicare Other | Admitting: Nurse Practitioner

## 2023-01-28 NOTE — Telephone Encounter (Signed)
   I personally called  to discuss with Mr Mario George.   He called a few days ago with hypotension. I am now covering for [facility] PA while she is on Maternity leave.    He provided me with his home BP.  Frequently dizzy in the morning.   1/26 SBP 70S  pulse 65 1/27 SBP 114-129  1/29 SBP 60 -120s   Stop losartan and continue to assess BP.   Almer Littleton NP-C  2:38 PM

## 2023-01-29 DIAGNOSIS — G20A1 Parkinson's disease without dyskinesia, without mention of fluctuations: Secondary | ICD-10-CM | POA: Diagnosis not present

## 2023-01-29 DIAGNOSIS — R1312 Dysphagia, oropharyngeal phase: Secondary | ICD-10-CM | POA: Diagnosis not present

## 2023-02-03 ENCOUNTER — Ambulatory Visit (HOSPITAL_COMMUNITY)
Admission: RE | Admit: 2023-02-03 | Discharge: 2023-02-03 | Disposition: A | Discharge status: Home / Self Care | Payer: Medicare Other | Source: Ambulatory Visit | Attending: Cardiology | Admitting: Cardiology

## 2023-02-03 DIAGNOSIS — I5020 Unspecified systolic (congestive) heart failure: Secondary | ICD-10-CM | POA: Diagnosis not present

## 2023-02-03 LAB — BASIC METABOLIC PANEL
Anion gap: 13 (ref 5–15)
BUN: 31 mg/dL — ABNORMAL HIGH (ref 8–23)
CO2: 21 mmol/L — ABNORMAL LOW (ref 22–32)
Calcium: 8.9 mg/dL (ref 8.9–10.3)
Chloride: 103 mmol/L (ref 98–111)
Creatinine, Ser: 1.12 mg/dL (ref 0.61–1.24)
GFR, Estimated: >60 mL/min (ref >60)
Glucose, Bld: 93 mg/dL (ref 70–99)
Potassium: 4.2 mmol/L (ref 3.5–5.1)
Sodium: 137 mmol/L (ref 135–145)

## 2023-02-03 LAB — BRAIN NATRIURETIC PEPTIDE: B Natriuretic Peptide: 554.9 pg/mL — ABNORMAL HIGH (ref 0.0–100.0)

## 2023-02-04 DIAGNOSIS — R1312 Dysphagia, oropharyngeal phase: Secondary | ICD-10-CM | POA: Diagnosis not present

## 2023-02-04 DIAGNOSIS — G20A1 Parkinson's disease without dyskinesia, without mention of fluctuations: Secondary | ICD-10-CM | POA: Diagnosis not present

## 2023-02-06 DIAGNOSIS — G20A1 Parkinson's disease without dyskinesia, without mention of fluctuations: Secondary | ICD-10-CM | POA: Diagnosis not present

## 2023-02-06 DIAGNOSIS — R1312 Dysphagia, oropharyngeal phase: Secondary | ICD-10-CM | POA: Diagnosis not present

## 2023-02-10 DIAGNOSIS — R1312 Dysphagia, oropharyngeal phase: Secondary | ICD-10-CM | POA: Diagnosis not present

## 2023-02-10 DIAGNOSIS — G20A1 Parkinson's disease without dyskinesia, without mention of fluctuations: Secondary | ICD-10-CM | POA: Diagnosis not present

## 2023-02-12 DIAGNOSIS — R1312 Dysphagia, oropharyngeal phase: Secondary | ICD-10-CM | POA: Diagnosis not present

## 2023-02-12 DIAGNOSIS — G20A1 Parkinson's disease without dyskinesia, without mention of fluctuations: Secondary | ICD-10-CM | POA: Diagnosis not present

## 2023-02-17 DIAGNOSIS — G20A1 Parkinson's disease without dyskinesia, without mention of fluctuations: Secondary | ICD-10-CM | POA: Diagnosis not present

## 2023-02-17 DIAGNOSIS — R1312 Dysphagia, oropharyngeal phase: Secondary | ICD-10-CM | POA: Diagnosis not present

## 2023-02-18 NOTE — Progress Notes (Unsigned)
Cardiology Office Note:    Date:  02/20/2023   ID:  [name], DOB 25-Dec-1941, MRN [id number]  PCP:  Tammi Sou, MD   [place] Providers Cardiologist:  Kirk Ruths, MD Cardiology APP:  Ledora Bottcher, PA     Referring MD: Tammi Sou, MD   Chief Complaint  Patient presents with   Follow-up    Afib, CHF, CAD    History of Present Illness:    Mario George is a 82 y.o. 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  He was diagnosed with Parkinson's disease in 2018 and follows with neurology.  He walks independently but does have freezing of gait and postural instability.  He has a history of orthostatic hypotension and was on Flomax for BPH which has been discontinued.  Mr. Dirienzo had no prior cardiac history when cardiology was asked to evaluate during hospitalization 01/10/2023.  He presented with symptoms of hypervolemia and found to be in A-fib RVR.  This was a new diagnosis of A-fib. However, he states he has had Afib in the past but through shared decision making with the patient he was not anticoagulated (does not like taking medications.  Echocardiogram showed an LVEF of 25-30% with regional wall motion abnormality.  Given this new diagnosis of CHF with wall motion abnormality, he underwent right and left heart catheterization on 01/13/2023 which showed CTO of the proximal LAD with collaterals. Medical management was recommended. Normal right and left heart pressures. LVEDP 11 mmHg and wedge was 13 mmHg. Amiodarone was started for rate control and TEE-guided DCCV was planned. However, he converted to NSR and was discharged on 01/14/23 on eliquis. GDMT titrated slowly given orthostatic hypotension.   He was seen by [facility] 01/21/23. GDMT: 12.5 mg losartan, 12.5 mg spironolactone, 10 mg farxiga, PRN 40  mg lasix  I saw him in follow-up 01/22/2023.  Unfortunately he was unable to tolerate losartan secondary to hypotension and dizziness.  This was discontinued on 01/28/2023 by AHF via phone note.   He presents back today for cardiology follow up.  He is here alone today.  He appears quite well.  He has had no falls.  He reports no signs or symptoms of heart failure exacerbation.  EKG with sinus bradycardia today.  He is doing very well off of losartan.   Past Medical History:  Diagnosis Date   Atrial fibrillation ([place])    12/2022->converted in hosp on amio.  Eliquis.   BPH with obstruction/lower urinary tract symptoms    orthostatic hypotension on flomax   CAD (coronary artery disease)    Cath 12/2022: chronic LAD occlusion, no stent   Chronic hoarseness    Eczema    Legs.  Triamcinolone   GERD (gastroesophageal reflux disease)    Ischemic cardiomyopathy    01/10/23: EF 25-30%, severe LV dysfxn, regional WMA   OSA (obstructive sleep apnea)    quit cpap in 2017   Osteoarthritis, multiple sites    Paget's disease of bone    Zoledronic acid 2002 and again in 2012.  Monitoring alkaline phosphatase levels.   Parkinson's disease    Diagnosed 2018   TIA (transient ischemic attack)    12/2022 (R facial droop, dysarthria) when in hosp for a-fib/chf    Past Surgical History:  Procedure Laterality Date   carotid dopplers  12/2022. 1-39% bilat   COLONOSCOPY  2015   2015 normal--no further screening indicated   INGUINAL HERNIA REPAIR Right 07/14/2018   Procedure: OPEN RIGHT INGUINAL HERNIA REPAIR ERAS PATHWAY;  Surgeon: Fanny Skates, MD;  Location: [place];  Service: General;  Laterality: Right;   INSERTION OF MESH Right 07/14/2018   Procedure: INSERTION OF MESH;  Surgeon: Fanny Skates, MD;  Location: [place];  Service: General;  Laterality: Right;   KNEE SURGERY Right 2007   arthroscopic   RIGHT/LEFT HEART CATH AND CORONARY ANGIOGRAPHY N/A  01/13/2023   Chronic LAD occlusion, with collateral flow->med mgmt. Procedure: RIGHT/LEFT HEART CATH AND CORONARY ANGIOGRAPHY;  Surgeon: Burnell Blanks, MD;  Location: [place] [facility];  Service: Cardiovascular;  Laterality: N/A;   TRANSTHORACIC ECHOCARDIOGRAM     01/10/23: EF 25-30%, severe LV dysfxn, regional WMA   VASECTOMY  1975    Current Medications: Current Meds  Medication Sig   acetaminophen (TYLENOL) 325 MG tablet Take 650 mg by mouth as needed for moderate pain.   amiodarone (PACERONE) 200 MG tablet Take 1 tablet (200 mg total) by mouth daily.   apixaban (ELIQUIS) 5 MG TABS tablet Take 1 tablet (5 mg total) by mouth 2 (two) times daily.   carbidopa-levodopa (SINEMET CR) 50-200 MG tablet TAKE 1 TABLET BY MOUTH AT  BEDTIME   carbidopa-levodopa (SINEMET IR) 25-100 MG tablet Take 2 tablets by mouth 4 (four) times daily.   dapagliflozin propanediol (FARXIGA) 10 MG TABS tablet Take 1 tablet (10 mg total) by mouth daily.   furosemide (LASIX) 40 MG tablet Take 1 tablet (40 mg total) by mouth daily as needed for edema or fluid.   Multiple Vitamin (MULTIVITAMIN ADULT PO) Take 2 tablets by mouth daily.   Psyllium (METAMUCIL PO) Take 10-15 mLs by mouth daily.   spironolactone (ALDACTONE) 25 MG tablet Take 1/2 tablet (12.5 mg total) by mouth daily.     Allergies:   Statins, Vicks nyquil cough [doxylamine-dm], and Other   Social History   Socioeconomic History   Marital status: Married    Spouse name: Mario George   Number of children: 3   Years of education: Not on file   Highest education level: Bachelor's degree (e.g., BA, AB, BS)  Occupational History   Occupation: retired    Comment: health care admin/hospital  Tobacco Use   Smoking status: Former    Types: Cigarettes    Quit date: 12/30/1982    Years since quitting: 40.1   Smokeless tobacco: Never  Vaping Use   Vaping Use: Never used  Substance and Sexual Activity   Alcohol use: Yes    Comment: one drink (wine or  beer) daily   Drug use: No   Sexual activity: Not Currently  Other Topics Concern   Not on file  Social History Narrative   Married, several children, also grandchildren   Originally from [place].     Moved to [facility] April 2023.   Educ: College   Occup: retired Programmer, applications   No T/A/Ds   Social Determinants of Radio broadcast assistant Strain: Medium Risk (01/14/2023)   Overall Financial Resource Strain (CARDIA)    Difficulty of Paying Living Expenses: Somewhat hard  Food Insecurity: No Food Insecurity (01/15/2023)   Hunger Vital Sign    Worried About Running Out of Food in the Last Year: Never true    Ran Out of Food in the Last Year: Never true  Transportation Needs: No Transportation Needs (  01/15/2023)   PRAPARE - Hydrologist (Medical): No    Lack of Transportation (Non-Medical): No  Physical Activity: Sufficiently Active (11/06/2022)   Exercise Vital Sign    Days of Exercise per Week: 5 days    Minutes of Exercise per Session: 30 min  Stress: No Stress Concern Present (11/06/2022)   [place]    Feeling of Stress : Not at all  Social Connections: Moderately Isolated (11/06/2022)   Social Connection and Isolation Panel [NHANES]    Frequency of Communication with Friends and Family: More than three times a week    Frequency of Social Gatherings with Friends and Family: Once a week    Attends Religious Services: Never    Marine scientist or Organizations: No    Attends Music therapist: Never    Marital Status: Married     Family History: The patient's family history includes Alcohol abuse in his father and mother; Arthritis in his father and paternal grandmother; Cancer in his mother; Early death in his child and mother; Kidney disease in his sister; Liver disease in his father; Stroke in his brother, sister, and sister.  ROS:   Please see the  history of present illness.     All other systems reviewed and are negative.  EKGs/Labs/Other Studies Reviewed:    The following studies were reviewed today:  R/L [facility] 01/13/23:   Prox RCA lesion is 20% stenosed.   Prox LAD lesion is 99% stenosed.   Prox LAD to Mid LAD lesion is 100% stenosed.   Chronic total occlusion proximal LAD. The mid and distal vessel fills from right to left collaterals.  Large dominant Circumflex with no obstructive disease.  Moderate caliber non-dominant RCA with mild plaque. This vessel supplies the mid and distal LAD.  LVEDP 11 mmHg PCWP  13 mmHg   Recommendations: Medical management of CAD. His LAD is chronically occluded and fills from right to left collaterals. No indication for PCI at this time. Normal right and left pressures.      Echo 01/10/23  1. Left ventricular ejection fraction, by estimation, is 25 to 30%. The  left ventricle has severely decreased function. The left ventricle  demonstrates regional wall motion abnormalities (see scoring  diagram/findings for description). Left ventricular  diastolic function could not be evaluated.   2. Right ventricular systolic function is low normal. The right  ventricular size is normal. There is normal pulmonary artery systolic  pressure. The estimated right ventricular systolic pressure is [id number] mmHg.   3. Right atrial size was mildly dilated.   4. The mitral valve is grossly normal. Mild mitral valve regurgitation.  No evidence of mitral stenosis.   5. The aortic valve is tricuspid. Aortic valve regurgitation is not  visualized. No aortic stenosis is present.   6. The inferior vena cava is normal in size with <50% respiratory  variability, suggesting right atrial pressure of 8 mmHg.   EKG:  EKG is  ordered today.  The ekg ordered today demonstrates sinus bradycardia with HR 56 - stable from prior  Recent Labs: 01/09/2023: TSH 3.404 01/13/2023: ALT <5; Magnesium 2.1 01/21/2023: Hemoglobin 15.8;  Platelets 197 02/03/2023: B Natriuretic Peptide 554.9; BUN 31; Creatinine, Ser 1.12; Potassium 4.2; Sodium 137  Recent Lipid Panel    Component Value Date/Time   CHOL 153 01/13/2023 0043   TRIG 73 01/13/2023 0043   HDL 47 01/13/2023 0043   CHOLHDL  3.3 01/13/2023 0043   VLDL 15 01/13/2023 0043   LDLCALC 91 01/13/2023 0043     Risk Assessment/Calculations:    CHA2DS2-VASc Score = 3   This indicates a 3.2% annual risk of stroke. The patient's score is based upon: CHF History: 1 HTN History: 0 Diabetes History: 0 Stroke History: 0 Vascular Disease History: 1 Age Score: 2 Gender Score: 0            Physical Exam:    VS:  BP 115/70   Pulse (!) 56   Ht 6' 1"$  (1.854 m)   Wt 147 lb 3.2 oz (66.8 kg)   SpO2 97%   BMI 19.42 kg/m     Wt Readings from Last 3 Encounters:  02/20/23 147 lb 3.2 oz (66.8 kg)  01/22/23 147 lb 12.8 oz (67 kg)  01/21/23 147 lb 12.8 oz (67 kg)     GEN:  Well nourished, well developed in no acute distress HEENT: Normal NECK: No JVD; No carotid bruits LYMPHATICS: No lymphadenopathy CARDIAC: RRR, no murmurs, rubs, gallops RESPIRATORY:  Clear to auscultation without rales, wheezing or rhonchi  ABDOMEN: Soft, non-tender, non-distended MUSCULOSKELETAL:  No edema; No deformity  SKIN: Warm and dry NEUROLOGIC:  Alert and oriented x 3 PSYCHIATRIC:  Normal affect   ASSESSMENT:    1. HFrEF (heart failure with reduced ejection fraction) ([place])   2. Ischemic cardiomyopathy   3. Coronary artery disease involving native coronary artery of native heart without angina pectoris   4. Hyperlipidemia with target LDL less than 70   5. Chronic anticoagulation   6. PAF (paroxysmal atrial fibrillation) (HCC)   7. Parkinson's disease, unspecified whether dyskinesia present, unspecified whether manifestations fluctuate    PLAN:    In order of problems listed above:  Afib RVR PAF EKG at last visit with SB, remains in sinus bradycardia today Remains on amiodarone  200 mg daily Hold BB given orthostatic hypotension and sinus bradycardia   Amiodarone therapy TSH WNL (3.404) CXR at admission Taper: now on 200 mg daily   Chronic anticoagulation No signs of active bleeding on eliquis Will need to watch weight (67 kg) He is now 63 kg I have asked him to watch his weight and call us if he gets down to the 135 pound range.  He has lost about 4 pounds since the last time I saw him.   Chronic systolic heart failure LVEF 25-30% GDMT Farxiga, 12.5 mg spironolactone His blood pressure did not tolerate even a low-dose of losartan.  I believe that we are at maximum therapy Patient assistance submitted for [place] to repeat echo in approximately 2 months If EF remains depressed, will need to consider if he is a candidate for ICD   CAD CTO of LAD with collaterals No antiplatelet in the setting of eliquis No BB given orthostatic hypotension No chest pain   Hyperlipidemia with LDL goal < 70 01/13/2023: Cholesterol 153; HDL 47; LDL Cholesterol 91; Triglycerides 73; VLDL 15 No statin due to weakness and myalgias Referred to lipid pharmD for PCSK9i   Parkinson's disease Stable on current regimen No falls   Follow up in 3-4 months.      Medication Adjustments/Labs and Tests Ordered: Current medicines are reviewed at length with the patient today.  Concerns regarding medicines are outlined above.  Orders Placed This Encounter  Procedures   EKG 12-Lead   ECHOCARDIOGRAM COMPLETE   No orders of the defined types were placed in this encounter.   Patient Instructions  Medication Instructions:  Take Farxiga 10 mg ( Take  In the Morning). Take Spirolactone 25 mg   ( Take In the Morning). *If you need a refill on your cardiac medications before your next appointment, please call your pharmacy*   Lab Work: No labs If you have labs (blood work) drawn today and your tests are completely normal, you will receive your results only  by: [place] (if you have MyChart) OR A paper copy in the mail If you have any lab test that is abnormal or we need to change your treatment, we will call you to review the results.   Testing/Procedures: [address], [address]. Your physician has requested that you have an echocardiogram. Echocardiography is a painless test that uses sound waves to create images of your heart. It provides your doctor with information about the size and shape of your heart and how well your heart's chambers and valves are working. This procedure takes approximately one hour. There are no restrictions for this procedure. Please do NOT wear cologne, perfume, aftershave, or lotions (deodorant is allowed). Please arrive 15 minutes prior to your appointment time.    Follow-Up: At [facility], you and your health needs are our priority.  As part of our continuing mission to provide you with exceptional heart care, we have created designated Provider Care Teams.  These Care Teams include your primary Cardiologist (physician) and Advanced Practice Providers (APPs -  Physician Assistants and Nurse Practitioners) who all work together to provide you with the care you need, when you need it.  We recommend signing up for the patient portal called "MyChart".  Sign up information is provided on this After Visit Summary.  MyChart is used to connect with patients for Virtual Visits (Telemedicine).  Patients are able to view lab/test results, encounter notes, upcoming appointments, etc.  Non-urgent messages can be sent to your provider as well.   To learn more about what you can do with MyChart, go to [email or web address].    Your next appointment:   3-4 month(s)  Provider:   Fabian Sharp, PA-C   or, Kirk Ruths, MD    Signed, [place], [place]  02/20/2023 12:21 PM    [place]

## 2023-02-20 ENCOUNTER — Encounter: Payer: Self-pay | Admitting: Physician Assistant

## 2023-02-20 ENCOUNTER — Ambulatory Visit: Payer: Medicare Other | Attending: Physician Assistant | Admitting: Physician Assistant

## 2023-02-20 VITALS — BP 115/70 | HR 56 | Ht 73.0 in | Wt 147.2 lb

## 2023-02-20 DIAGNOSIS — I2510 Atherosclerotic heart disease of native coronary artery without angina pectoris: Secondary | ICD-10-CM

## 2023-02-20 DIAGNOSIS — I480 Paroxysmal atrial fibrillation: Secondary | ICD-10-CM | POA: Diagnosis not present

## 2023-02-20 DIAGNOSIS — I255 Ischemic cardiomyopathy: Secondary | ICD-10-CM | POA: Diagnosis not present

## 2023-02-20 DIAGNOSIS — I5020 Unspecified systolic (congestive) heart failure: Secondary | ICD-10-CM

## 2023-02-20 DIAGNOSIS — R1312 Dysphagia, oropharyngeal phase: Secondary | ICD-10-CM | POA: Diagnosis not present

## 2023-02-20 DIAGNOSIS — G20A1 Parkinson's disease without dyskinesia, without mention of fluctuations: Secondary | ICD-10-CM | POA: Diagnosis not present

## 2023-02-20 DIAGNOSIS — Z7901 Long term (current) use of anticoagulants: Secondary | ICD-10-CM

## 2023-02-20 DIAGNOSIS — E785 Hyperlipidemia, unspecified: Secondary | ICD-10-CM | POA: Diagnosis not present

## 2023-02-20 NOTE — Patient Instructions (Addendum)
Medication Instructions:  Take Farxiga 10 mg ( Take  In the Morning). Take Spirolactone 25 mg   ( Take In the Morning). *If you need a refill on your cardiac medications before your next appointment, please call your pharmacy*   Lab Work: No labs If you have labs (blood work) drawn today and your tests are completely normal, you will receive your results only by: [place] (if you have MyChart) OR A paper copy in the mail If you have any lab test that is abnormal or we need to change your treatment, we will call you to review the results.   Testing/Procedures: [address]. Your physician has requested that you have an echocardiogram. Echocardiography is a painless test that uses sound waves to create images of your heart. It provides your doctor with information about the size and shape of your heart and how well your heart's chambers and valves are working. This procedure takes approximately one hour. There are no restrictions for this procedure. Please do NOT wear cologne, perfume, aftershave, or lotions (deodorant is allowed). Please arrive 15 minutes prior to your appointment time.    Follow-Up: At [facility], you and your health needs are our priority.  As part of our continuing mission to provide you with exceptional heart care, we have created designated Provider Care Teams.  These Care Teams include your primary Cardiologist (physician) and Advanced Practice Providers (APPs -  Physician Assistants and Nurse Practitioners) who all work together to provide you with the care you need, when you need it.  We recommend signing up for the patient portal called "MyChart".  Sign up information is provided on this After Visit Summary.  MyChart is used to connect with patients for Virtual Visits (Telemedicine).  Patients are able to view lab/test results, encounter notes, upcoming appointments, etc.  Non-urgent messages can be sent to your provider as  well.   To learn more about what you can do with MyChart, go to [email or web address].    Your next appointment:   3-4 month(s)  Provider:   Fabian Sharp, PA-C   or, Kirk Ruths, MD

## 2023-02-24 DIAGNOSIS — G20A1 Parkinson's disease without dyskinesia, without mention of fluctuations: Secondary | ICD-10-CM | POA: Diagnosis not present

## 2023-02-24 DIAGNOSIS — R1312 Dysphagia, oropharyngeal phase: Secondary | ICD-10-CM | POA: Diagnosis not present

## 2023-02-26 DIAGNOSIS — G20A1 Parkinson's disease without dyskinesia, without mention of fluctuations: Secondary | ICD-10-CM | POA: Diagnosis not present

## 2023-02-26 DIAGNOSIS — R1312 Dysphagia, oropharyngeal phase: Secondary | ICD-10-CM | POA: Diagnosis not present

## 2023-02-27 DIAGNOSIS — H40023 Open angle with borderline findings, high risk, bilateral: Secondary | ICD-10-CM | POA: Diagnosis not present

## 2023-03-21 NOTE — Patient Instructions (Signed)
Health Maintenance, Male Adopting a healthy lifestyle and getting preventive care are important in promoting health and wellness. Ask your health care provider about: The right schedule for you to have regular tests and exams. Things you can do on your own to prevent diseases and keep yourself healthy. What should I know about diet, weight, and exercise? Eat a healthy diet  Eat a diet that includes plenty of vegetables, fruits, low-fat dairy products, and lean protein. Do not eat a lot of foods that are high in solid fats, added sugars, or sodium. Maintain a healthy weight Body mass index (BMI) is a measurement that can be used to identify possible weight problems. It estimates body fat based on height and weight. Your health care provider can help determine your BMI and help you achieve or maintain a healthy weight. Get regular exercise Get regular exercise. This is one of the most important things you can do for your health. Most adults should: Exercise for at least 150 minutes each week. The exercise should increase your heart rate and make you sweat (moderate-intensity exercise). Do strengthening exercises at least twice a week. This is in addition to the moderate-intensity exercise. Spend less time sitting. Even light physical activity can be beneficial. Watch cholesterol and blood lipids Have your blood tested for lipids and cholesterol at 82 years of age, then have this test every 5 years. You may need to have your cholesterol levels checked more often if: Your lipid or cholesterol levels are high. You are older than 82 years of age. You are at high risk for heart disease. What should I know about cancer screening? Many types of cancers can be detected early and may often be prevented. Depending on your health history and family history, you may need to have cancer screening at various ages. This may include screening for: Colorectal cancer. Prostate cancer. Skin cancer. Lung  cancer. What should I know about heart disease, diabetes, and high blood pressure? Blood pressure and heart disease High blood pressure causes heart disease and increases the risk of stroke. This is more likely to develop in people who have high blood pressure readings or are overweight. Talk with your health care provider about your target blood pressure readings. Have your blood pressure checked: Every 3-5 years if you are 18-39 years of age. Every year if you are 40 years old or older. If you are between the ages of 65 and 75 and are a current or former smoker, ask your health care provider if you should have a one-time screening for abdominal aortic aneurysm (AAA). Diabetes Have regular diabetes screenings. This checks your fasting blood sugar level. Have the screening done: Once every three years after age 45 if you are at a normal weight and have a low risk for diabetes. More often and at a younger age if you are overweight or have a high risk for diabetes. What should I know about preventing infection? Hepatitis B If you have a higher risk for hepatitis B, you should be screened for this virus. Talk with your health care provider to find out if you are at risk for hepatitis B infection. Hepatitis C Blood testing is recommended for: Everyone born from 1945 through 1965. Anyone with known risk factors for hepatitis C. Sexually transmitted infections (STIs) You should be screened each year for STIs, including gonorrhea and chlamydia, if: You are sexually active and are younger than 82 years of age. You are older than 82 years of age and your   health care provider tells you that you are at risk for this type of infection. Your sexual activity has changed since you were last screened, and you are at increased risk for chlamydia or gonorrhea. Ask your health care provider if you are at risk. Ask your health care provider about whether you are at high risk for HIV. Your health care provider  may recommend a prescription medicine to help prevent HIV infection. If you choose to take medicine to prevent HIV, you should first get tested for HIV. You should then be tested every 3 months for as long as you are taking the medicine. Follow these instructions at home: Alcohol use Do not drink alcohol if your health care provider tells you not to drink. If you drink alcohol: Limit how much you have to 0-2 drinks a day. Know how much alcohol is in your drink. In the [place] one drink equals one 12 oz bottle of beer (355 mL), one 5 oz glass of wine (148 mL), or one 1 oz glass of hard liquor (44 mL). Lifestyle Do not use any products that contain nicotine or tobacco. These products include cigarettes, chewing tobacco, and vaping devices, such as e-cigarettes. If you need help quitting, ask your health care provider. Do not use street drugs. Do not share needles. Ask your health care provider for help if you need support or information about quitting drugs. General instructions Schedule regular health, dental, and eye exams. Stay current with your vaccines. Tell your health care provider if: You often feel depressed. You have ever been abused or do not feel safe at home. Summary Adopting a healthy lifestyle and getting preventive care are important in promoting health and wellness. Follow your health care provider's instructions about healthy diet, exercising, and getting tested or screened for diseases. Follow your health care provider's instructions on monitoring your cholesterol and blood pressure. This information is not intended to replace advice given to you by your health care provider. Make sure you discuss any questions you have with your health care provider. Document Revised: 05/07/2021 Document Reviewed: 05/07/2021 Elsevier Patient Education  2023 [employer].  

## 2023-03-24 ENCOUNTER — Ambulatory Visit (HOSPITAL_COMMUNITY): Payer: Medicare Other | Attending: Cardiology

## 2023-03-24 DIAGNOSIS — E785 Hyperlipidemia, unspecified: Secondary | ICD-10-CM

## 2023-03-24 DIAGNOSIS — I5020 Unspecified systolic (congestive) heart failure: Secondary | ICD-10-CM | POA: Insufficient documentation

## 2023-03-24 DIAGNOSIS — I2510 Atherosclerotic heart disease of native coronary artery without angina pectoris: Secondary | ICD-10-CM | POA: Diagnosis not present

## 2023-03-24 DIAGNOSIS — I255 Ischemic cardiomyopathy: Secondary | ICD-10-CM | POA: Diagnosis not present

## 2023-03-24 LAB — ECHOCARDIOGRAM COMPLETE
Area-P 1/2: 2.59 cm2
S' Lateral: 2.8 cm

## 2023-03-27 ENCOUNTER — Encounter: Payer: Self-pay | Admitting: Family Medicine

## 2023-03-27 ENCOUNTER — Ambulatory Visit (INDEPENDENT_AMBULATORY_CARE_PROVIDER_SITE_OTHER): Payer: Medicare Other | Admitting: Family Medicine

## 2023-03-27 VITALS — BP 92/57 | HR 56 | Temp 97.8°F | Ht 71.26 in | Wt 146.4 lb

## 2023-03-27 DIAGNOSIS — Z7901 Long term (current) use of anticoagulants: Secondary | ICD-10-CM

## 2023-03-27 DIAGNOSIS — I5020 Unspecified systolic (congestive) heart failure: Secondary | ICD-10-CM

## 2023-03-27 DIAGNOSIS — Z0000 Encounter for general adult medical examination without abnormal findings: Secondary | ICD-10-CM

## 2023-03-27 DIAGNOSIS — E7800 Pure hypercholesterolemia, unspecified: Secondary | ICD-10-CM | POA: Diagnosis not present

## 2023-03-27 DIAGNOSIS — Z789 Other specified health status: Secondary | ICD-10-CM | POA: Diagnosis not present

## 2023-03-27 DIAGNOSIS — I480 Paroxysmal atrial fibrillation: Secondary | ICD-10-CM

## 2023-03-27 DIAGNOSIS — Z23 Encounter for immunization: Secondary | ICD-10-CM | POA: Diagnosis not present

## 2023-03-27 DIAGNOSIS — I2510 Atherosclerotic heart disease of native coronary artery without angina pectoris: Secondary | ICD-10-CM

## 2023-03-27 LAB — COMPREHENSIVE METABOLIC PANEL
ALT: 6 U/L (ref 0–53)
AST: 23 U/L (ref 0–37)
Albumin: 4.3 g/dL (ref 3.5–5.2)
Alkaline Phosphatase: 94 U/L (ref 39–117)
BUN: 26 mg/dL — ABNORMAL HIGH (ref 6–23)
CO2: 30 mEq/L (ref 19–32)
Calcium: 9.6 mg/dL (ref 8.4–10.5)
Chloride: 103 mEq/L (ref 96–112)
Creatinine, Ser: 1.34 mg/dL (ref 0.40–1.50)
GFR: 49.43 mL/min — ABNORMAL LOW (ref >60.00)
Glucose, Bld: 95 mg/dL (ref 70–99)
Potassium: 4.5 mEq/L (ref 3.5–5.1)
Sodium: 139 mEq/L (ref 135–145)
Total Bilirubin: 0.6 mg/dL (ref 0.2–1.2)
Total Protein: 7.7 g/dL (ref 6.0–8.3)

## 2023-03-27 LAB — CBC WITH DIFFERENTIAL/PLATELET
Basophils Absolute: 0.1 10*3/uL (ref 0.0–0.1)
Basophils Relative: 1.3 % (ref 0.0–3.0)
Eosinophils Absolute: 0.2 10*3/uL (ref 0.0–0.7)
Eosinophils Relative: 3.5 % (ref 0.0–5.0)
HCT: 44.8 % (ref 39.0–52.0)
Hemoglobin: 14.8 g/dL (ref 13.0–17.0)
Lymphocytes Relative: 13.7 % (ref 12.0–46.0)
Lymphs Abs: 0.8 10*3/uL (ref 0.7–4.0)
MCHC: 33 g/dL (ref 30.0–36.0)
MCV: 93.9 fl (ref 78.0–100.0)
Monocytes Absolute: 0.7 10*3/uL (ref 0.1–1.0)
Monocytes Relative: 11.9 % (ref 3.0–12.0)
Neutro Abs: 4 10*3/uL (ref 1.4–7.7)
Neutrophils Relative %: 69.6 % (ref 43.0–77.0)
Platelets: 207 10*3/uL (ref 150.0–400.0)
RBC: 4.78 Mil/uL (ref 4.22–5.81)
RDW: 15.6 % — ABNORMAL HIGH (ref 11.5–15.5)
WBC: 5.8 10*3/uL (ref 4.0–10.5)

## 2023-03-27 LAB — LIPID PANEL
Cholesterol: 207 mg/dL — ABNORMAL HIGH (ref 0–200)
HDL: 64.6 mg/dL (ref >39.00)
LDL Cholesterol: 124 mg/dL — ABNORMAL HIGH (ref 0–99)
NonHDL: 142.55
Total CHOL/HDL Ratio: 3
Triglycerides: 91 mg/dL (ref 0.0–149.0)
VLDL: 18.2 mg/dL (ref 0.0–40.0)

## 2023-03-27 MED ORDER — TETANUS-DIPHTH-ACELL PERTUSSIS 5-2.5-18.5 LF-MCG/0.5 IM SUSP: 0.5000 mL | Freq: Once | INTRAMUSCULAR | 0 refills | Status: AC

## 2023-03-27 NOTE — Progress Notes (Signed)
Office Note 03/27/2023  CC:  Chief Complaint  Patient presents with   Annual Exam    HPI:  Patient is a 82 y.o. male who is here companied by his wife for annual health maintenance exam and also up A-fib and ischemic cardiomyopathy.. I last saw him 01/17/2023. A/P as of that visit:  "#1 atrial fibrillation, he converted to sinus rhythm with initiation of amiodarone. He is tapering amiodarone to a once daily dose of 200 mg. Doing well on Eliquis 5 mg twice daily. Signs/symptoms to call or return for were reviewed and pt expressed understanding.   #2 acute systolic congestive heart failure/ischemic cardiomyopathy. Cath with coronary artery disease that did not require intervention. EF was 25 to 30% on echo. Asymptomatic, weight stable--no diuretics needed at this time. Continue Farxiga 10 mg a day and spironolactone 12.5 mg a day. He will follow-up with cardiology on 01/22/2023 and they I suspect they will consider adding ARB and/or beta-blocker at that time. Basic metabolic panel today."  INTERIM HX: He is feeling well.  He is having much less dizziness.  Initially about 3 times a week and now once only every 2 weeks. He is doing cardio drumming and working on some balance activities and has no symptoms. No swelling or weight gain.  No palpitations or chest pain. Blood pressures in the low normal range consistently.  He had an echo 3 days ago and it showed ejection fraction 55 to 60%!  Past Medical History:  Diagnosis Date   Atrial fibrillation ([place])    12/2022->converted in hosp on amio.  Eliquis.   BPH with obstruction/lower urinary tract symptoms    orthostatic hypotension on flomax   CAD (coronary artery disease)    Cath 12/2022: chronic LAD occlusion, no stent   Chronic hoarseness    Eczema    Legs.  Triamcinolone   GERD (gastroesophageal reflux disease)    Ischemic cardiomyopathy    01/10/23: EF 25-30%, severe LV dysfxn, regional WMA   OSA (obstructive sleep apnea)     quit cpap in 2017   Osteoarthritis, multiple sites    Paget's disease of bone    Zoledronic acid 2002 and again in 2012.  Monitoring alkaline phosphatase levels.   Parkinson's disease    Diagnosed 2018   TIA (transient ischemic attack)    12/2022 (R facial droop, dysarthria) when in hosp for a-fib/chf    Past Surgical History:  Procedure Laterality Date   carotid dopplers     12/2022. 1-39% bilat   COLONOSCOPY  2015   2015 normal--no further screening indicated   INGUINAL HERNIA REPAIR Right 07/14/2018   Procedure: OPEN RIGHT INGUINAL HERNIA REPAIR ERAS PATHWAY;  Surgeon: Fanny Skates, MD;  Location: [facility];  Service: General;  Laterality: Right;   INSERTION OF MESH Right 07/14/2018   Procedure: INSERTION OF MESH;  Surgeon: Fanny Skates, MD;  Location: [facility];  Service: General;  Laterality: Right;   KNEE SURGERY Right 2007   arthroscopic   RIGHT/LEFT HEART CATH AND CORONARY ANGIOGRAPHY N/A 01/13/2023   Chronic LAD occlusion, with collateral flow->med mgmt. Procedure: RIGHT/LEFT HEART CATH AND CORONARY ANGIOGRAPHY;  Surgeon: Burnell Blanks, MD;  Location: [facility];  Service: Cardiovascular;  Laterality: N/A;   TRANSTHORACIC ECHOCARDIOGRAM     01/10/23: EF 25-30%, severe LV dysfxn, regional WMA   VASECTOMY  1975    Family History  Problem Relation Age of Onset   Cancer Mother  intestinal   Early death Mother    Alcohol abuse Mother    Liver disease Father        ETOH   Alcohol abuse Father    Arthritis Father    Stroke Sister    Kidney disease Sister    Stroke Sister    Stroke Brother    Arthritis Paternal Grandmother    Early death Child     Social History   Socioeconomic History   Marital status: Married    Spouse name: Sonia Baller   Number of children: 3   Years of education: Not on file   Highest education level: Bachelor's degree (e.g., BA, AB, BS)  Occupational History   Occupation: retired     Comment: health care admin/hospital  Tobacco Use   Smoking status: Former    Types: Cigarettes    Quit date: 12/30/1982    Years since quitting: 40.2   Smokeless tobacco: Never  Vaping Use   Vaping Use: Never used  Substance and Sexual Activity   Alcohol use: Yes    Comment: one drink (wine or beer) daily   Drug use: No   Sexual activity: Not Currently  Other Topics Concern   Not on file  Social History Narrative   Married, several children, also grandchildren   Originally from [place].     Moved to [facility] April 2023.   Educ: College   Occup: retired Programmer, applications   No T/A/Ds   Social Determinants of Radio broadcast assistant Strain: Medium Risk (01/14/2023)   Overall Financial Resource Strain (CARDIA)    Difficulty of Paying Living Expenses: Somewhat hard  Food Insecurity: No Food Insecurity (01/15/2023)   Hunger Vital Sign    Worried About Running Out of Food in the Last Year: Never true    [place] in the Last Year: Never true  Transportation Needs: No Transportation Needs (01/15/2023)   PRAPARE - Hydrologist (Medical): No    Lack of Transportation (Non-Medical): No  Physical Activity: Sufficiently Active (11/06/2022)   Exercise Vital Sign    Days of Exercise per Week: 5 days    Minutes of Exercise per Session: 30 min  Stress: No Stress Concern Present (11/06/2022)   [place]    Feeling of Stress : Not at all  Social Connections: Moderately Isolated (11/06/2022)   Social Connection and Isolation Panel [NHANES]    Frequency of Communication with Friends and Family: More than three times a week    Frequency of Social Gatherings with Friends and Family: Once a week    Attends Religious Services: Never    Marine scientist or Organizations: No    Attends Archivist Meetings: Never    Marital Status: Married  Human resources officer Violence: Not At  Risk (11/06/2022)   Humiliation, Afraid, Rape, and Kick questionnaire    Fear of Current or Ex-Partner: No    Emotionally Abused: No    Physically Abused: No    Sexually Abused: No    Outpatient Medications Prior to Visit  Medication Sig Dispense Refill   acetaminophen (TYLENOL) 325 MG tablet Take 650 mg by mouth as needed for moderate pain.     amiodarone (PACERONE) 200 MG tablet Take 1 tablet (200 mg total) by mouth daily. 90 tablet 3   apixaban (ELIQUIS) 5 MG TABS tablet Take 1 tablet (5 mg total) by mouth 2 (two) times daily. [place]  tablet 3   carbidopa-levodopa (SINEMET CR) 50-200 MG tablet TAKE 1 TABLET BY MOUTH AT  BEDTIME 90 tablet 1   carbidopa-levodopa (SINEMET IR) 25-100 MG tablet Take 2 tablets by mouth 4 (four) times daily. 720 tablet 0   dapagliflozin propanediol (FARXIGA) 10 MG TABS tablet Take 1 tablet (10 mg total) by mouth daily. 90 tablet 3   furosemide (LASIX) 40 MG tablet Take 1 tablet (40 mg total) by mouth daily as needed for edema or fluid. 30 tablet 2   Multiple Vitamin (MULTIVITAMIN ADULT PO) Take 2 tablets by mouth daily.     Psyllium (METAMUCIL PO) Take 10-15 mLs by mouth daily.     spironolactone (ALDACTONE) 25 MG tablet Take 1/2 tablet (12.5 mg total) by mouth daily. 90 tablet 3   No facility-administered medications prior to visit.    Allergies  Allergen Reactions   Statins Other (See Comments)    Weakness/myalgia    Vicks Nyquil Cough [Doxylamine-Dm]     Felt like skin was crawling   Other Rash    Had rash on arm after pneumonia and flu vaccine together - never had either separately prior to this reaction.  Zquil - "felt like his skin was crawling"     Review of Systems  Constitutional:  Negative for appetite change, chills, fatigue and fever.  HENT:  Negative for congestion, dental problem, ear pain and sore throat.   Eyes:  Negative for discharge, redness and visual disturbance.  Respiratory:  Negative for cough, chest tightness, shortness of  breath and wheezing.   Cardiovascular:  Negative for chest pain, palpitations and leg swelling.  Gastrointestinal:  Negative for abdominal pain, blood in stool, diarrhea, nausea and vomiting.  Genitourinary:  Negative for difficulty urinating, dysuria, flank pain, frequency, hematuria and urgency.  Musculoskeletal:  Negative for arthralgias, back pain, joint swelling, myalgias and neck stiffness.  Skin:  Negative for pallor and rash.  Neurological:  Positive for tremors (chronic). Negative for dizziness, speech difficulty, weakness and headaches.  Hematological:  Negative for adenopathy. Does not bruise/bleed easily.  Psychiatric/Behavioral:  Negative for confusion and sleep disturbance. The patient is not nervous/anxious.     PE;    03/27/2023    8:26 AM 02/20/2023   11:11 AM 01/22/2023    2:00 PM  Vitals with BMI  Height 5' 11.26" 6\' 1"  6\' 1"   Weight 146 lbs 6 oz 147 lbs 3 oz 147 lbs 13 oz  BMI 20.27 [id number]  Systolic 92 [id number]  Diastolic 57 70 62  Pulse 56 56 57    Gen: Alert, well appearing.  Patient is oriented to person, place, time, and situation. AFFECT: pleasant, lucid thought and speech. ENT: Ears: EACs clear, normal epithelium.  TMs with good light reflex and landmarks bilaterally.  Eyes: no injection, icteris, swelling, or exudate.  EOMI, PERRLA. Nose: no drainage or turbinate edema/swelling.  No injection or focal lesion.  Mouth: lips without lesion/swelling.  Oral mucosa pink and moist.  Dentition intact and without obvious caries or gingival swelling.  Oropharynx without erythema, exudate, or swelling.  Neck: supple/nontender.  No LAD, mass, or TM.  Carotid pulses 2+ bilaterally, without bruits. CV: RRR, no m/r/g.   LUNGS: CTA bilat, nonlabored resps, good aeration in all lung fields. ABD: soft, NT, ND, BS normal.  No hepatospenomegaly or mass.  No bruits. EXT: no clubbing, cyanosis, or edema.  Musculoskeletal: no joint swelling, erythema, warmth, or tenderness.   ROM of all joints intact. Skin - no sores  or suspicious lesions or rashes or color changes  Pertinent labs:  Lab Results  Component Value Date   TSH 3.404 01/09/2023   Lab Results  Component Value Date   WBC 5.4 01/21/2023   HGB 15.8 01/21/2023   HCT 47.7 01/21/2023   MCV 95.0 01/21/2023   PLT 197 01/21/2023   Lab Results  Component Value Date   CREATININE 1.12 02/03/2023   BUN 31 (H) 02/03/2023   NA 137 02/03/2023   K 4.2 02/03/2023   CL 103 02/03/2023   CO2 21 (L) 02/03/2023   Lab Results  Component Value Date   ALT <5 01/13/2023   AST 26 01/13/2023   ALKPHOS 68 01/13/2023   BILITOT 0.7 01/13/2023   Lab Results  Component Value Date   CHOL 153 01/13/2023   Lab Results  Component Value Date   HDL 47 01/13/2023   Lab Results  Component Value Date   LDLCALC 91 01/13/2023   Lab Results  Component Value Date   TRIG 73 01/13/2023   Lab Results  Component Value Date   CHOLHDL 3.3 01/13/2023   Lab Results  Component Value Date   HGBA1C 5.6 01/13/2023   ASSESSMENT AND PLAN:   #1 health maintenance exam: Reviewed age and gender appropriate health maintenance issues (prudent diet, regular exercise, health risks of tobacco and excessive alcohol, use of seatbelts, fire alarms in home, use of sunscreen).  Also reviewed age and gender appropriate health screening as well as vaccine recommendations. Vaccines: Prevnar 20->given today.  Tdap-->rx to pharmacy Labs: C-Met, lipids, CBC. Prostate ca screening: no further screening indicated d/t age. Colon ca screening: no further screening indicated d/t age.  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 Most recent echo showed recovery of normal ejection fraction. He is monitoring weight closely.  No edema.  He has Lasix to use as needed.  He continues Farxiga 10 mg a day and Aldactone 12.5 mg a day. Bp won't tolerate low dose losartan, brady won't allow BB.  #3 atrial fibrillation. Normal sinus rhythm on  amiodarone 200 mg a day.  Continue Eliquis 5 mg twice a day.  CBC today.  Electrolytes today.  4.  Hypercholesterolemia in the setting of coronary artery disease. Goal LDL less than 70.  He is intolerant of statins. Refer to lipid clinic for consideration of PCSK9 inhibitor Check lipid panel today (not quite fasting)  An After Visit Summary was printed and given to the patient.  FOLLOW UP:  No follow-ups on file.  Signed:  Crissie Sickles, MD           03/27/2023

## 2023-03-28 ENCOUNTER — Telehealth: Payer: Self-pay | Admitting: Cardiology

## 2023-03-28 NOTE — Telephone Encounter (Signed)
Spoke with patient. He was not returning a call for results. He was calling about a referral to lipid clinic (Dr. Debara Pickett) per patient's PCP. Routed message to scheduling team.

## 2023-03-28 NOTE — Telephone Encounter (Signed)
Patient was returning call for results. Please advise °

## 2023-03-31 ENCOUNTER — Other Ambulatory Visit: Payer: Self-pay | Admitting: Family Medicine

## 2023-03-31 ENCOUNTER — Encounter: Payer: Self-pay | Admitting: Family Medicine

## 2023-03-31 DIAGNOSIS — N1831 Chronic kidney disease, stage 3a: Secondary | ICD-10-CM

## 2023-04-02 NOTE — Telephone Encounter (Signed)
Patient schedule with Dr. Debara Pickett lipid clinic for 7/30 at the [facility] office.

## 2023-04-03 ENCOUNTER — Encounter: Payer: Self-pay | Admitting: Family Medicine

## 2023-04-09 ENCOUNTER — Other Ambulatory Visit (INDEPENDENT_AMBULATORY_CARE_PROVIDER_SITE_OTHER): Payer: Medicare Other

## 2023-04-09 DIAGNOSIS — N1831 Chronic kidney disease, stage 3a: Secondary | ICD-10-CM

## 2023-04-09 LAB — BASIC METABOLIC PANEL
BUN: 29 mg/dL — ABNORMAL HIGH (ref 6–23)
CO2: 30 mEq/L (ref 19–32)
Calcium: 9.2 mg/dL (ref 8.4–10.5)
Chloride: 101 mEq/L (ref 96–112)
Creatinine, Ser: 1.28 mg/dL (ref 0.40–1.50)
GFR: 52.21 mL/min — ABNORMAL LOW (ref >60.00)
Glucose, Bld: 95 mg/dL (ref 70–99)
Potassium: 4.4 mEq/L (ref 3.5–5.1)
Sodium: 139 mEq/L (ref 135–145)

## 2023-04-18 ENCOUNTER — Encounter: Payer: Self-pay | Admitting: Family Medicine

## 2023-04-18 ENCOUNTER — Ambulatory Visit (INDEPENDENT_AMBULATORY_CARE_PROVIDER_SITE_OTHER): Payer: Medicare Other | Admitting: Family Medicine

## 2023-04-18 VITALS — BP 98/66 | HR 61 | Wt 146.0 lb

## 2023-04-18 DIAGNOSIS — R600 Localized edema: Secondary | ICD-10-CM

## 2023-04-18 DIAGNOSIS — K5909 Other constipation: Secondary | ICD-10-CM

## 2023-04-18 DIAGNOSIS — N1831 Chronic kidney disease, stage 3a: Secondary | ICD-10-CM

## 2023-04-18 DIAGNOSIS — R7989 Other specified abnormal findings of blood chemistry: Secondary | ICD-10-CM

## 2023-04-18 DIAGNOSIS — I480 Paroxysmal atrial fibrillation: Secondary | ICD-10-CM

## 2023-04-18 DIAGNOSIS — L84 Corns and callosities: Secondary | ICD-10-CM

## 2023-04-18 LAB — BASIC METABOLIC PANEL
BUN: 31 mg/dL — ABNORMAL HIGH (ref 6–23)
CO2: 32 mEq/L (ref 19–32)
Calcium: 9 mg/dL (ref 8.4–10.5)
Chloride: 99 mEq/L (ref 96–112)
Creatinine, Ser: 1.21 mg/dL (ref 0.40–1.50)
GFR: 55.85 mL/min — ABNORMAL LOW (ref >60.00)
Glucose, Bld: 96 mg/dL (ref 70–99)
Potassium: 4.7 mEq/L (ref 3.5–5.1)
Sodium: 137 mEq/L (ref 135–145)

## 2023-04-18 NOTE — Progress Notes (Unsigned)
OFFICE VISIT  04/18/2023  CC:  Chief Complaint  Patient presents with   Follow-up    3 month follow up. Wants to discuss feet and knee concerns. He also wants to discuss kidney function.   Patient is a 82 y.o. male who presents for follow-up chronic renal insufficiency, chronic pain in both knees, lower extremity edema.  INTERIM HX: Legs not swollen anymore at all, not requiring any Lasix at this time.  Has mild to moderate chronic knee pain, had some questions about viscosupplementation injections today.  He has no redness or acute swelling of his knees.  Denies palpitations or racing heart or dizziness. No dyspnea on exertion or chest pain. He is drinking fluids well and taking his amiodarone, Eliquis, Farxiga, and spironolactone.  Past Medical History:  Diagnosis Date   Atrial fibrillation    12/2022->converted in hosp on amio.  Eliquis.   BPH with obstruction/lower urinary tract symptoms    orthostatic hypotension on flomax   CAD (coronary artery disease)    Cath 12/2022: chronic LAD occlusion, no stent   Chronic hoarseness    Chronic renal insufficiency, stage 3 (moderate)    Eczema    Legs.  Triamcinolone   GERD (gastroesophageal reflux disease)    Ischemic cardiomyopathy    01/10/23: EF 25-30%, severe LV dysfxn, regional WMA   OSA (obstructive sleep apnea)    quit cpap in 2017   Osteoarthritis, multiple sites    Paget's disease of bone    Zoledronic acid 2002 and again in 2012.  Monitoring alkaline phosphatase levels.   Parkinson's disease    Diagnosed 2018   TIA (transient ischemic attack)    12/2022 (R facial droop, dysarthria) when in hosp for a-fib/chf    Past Surgical History:  Procedure Laterality Date   carotid dopplers     12/2022. 1-39% bilat   COLONOSCOPY  2015   2015 normal--no further screening indicated   INGUINAL HERNIA REPAIR Right 07/14/2018   Procedure: OPEN RIGHT INGUINAL HERNIA REPAIR ERAS PATHWAY;  Surgeon: Claud Kelp, MD;  Location:  [facility];  Service: General;  Laterality: Right;   INSERTION OF MESH Right 07/14/2018   Procedure: INSERTION OF MESH;  Surgeon: Claud Kelp, MD;  [facility];  Service: General;  Laterality: Right;   KNEE SURGERY Right 2007   arthroscopic   RIGHT/LEFT HEART CATH AND CORONARY ANGIOGRAPHY N/A 01/13/2023   Chronic LAD occlusion, with collateral flow->med mgmt. Procedure: RIGHT/LEFT HEART CATH AND CORONARY ANGIOGRAPHY;  Surgeon: Kathleene Hazel, MD;  [facility];  Service: Cardiovascular;  Laterality: N/A;   TRANSTHORACIC ECHOCARDIOGRAM     01/10/23: EF 25-30%, severe LV dysfxn, regional WMA   TRANSTHORACIC ECHOCARDIOGRAM     03/24/23 EF 55-60%, normal WM, normal valves, mild aortic dilatation   VASECTOMY  1975    Outpatient Medications Prior to Visit  Medication Sig Dispense Refill   acetaminophen (TYLENOL) 325 MG tablet Take 650 mg by mouth as needed for moderate pain.     amiodarone (PACERONE) 200 MG tablet Take 1 tablet (200 mg total) by mouth daily. 90 tablet 3   apixaban (ELIQUIS) 5 MG TABS tablet Take 1 tablet (5 mg total) by mouth 2 (two) times daily. 180 tablet 3   carbidopa-levodopa (SINEMET CR) 50-200 MG tablet TAKE 1 TABLET BY MOUTH AT  BEDTIME 90 tablet 1   carbidopa-levodopa (SINEMET IR) 25-100 MG tablet Take 2 tablets by mouth 4 (four) times daily. 720 tablet 0  dapagliflozin propanediol (FARXIGA) 10 MG TABS tablet Take 1 tablet (10 mg total) by mouth daily. 90 tablet 3   Multiple Vitamin (MULTIVITAMIN ADULT PO) Take 2 tablets by mouth daily.     Psyllium (METAMUCIL PO) Take 10-15 mLs by mouth daily.     spironolactone (ALDACTONE) 25 MG tablet Take 1/2 tablet (12.5 mg total) by mouth daily. 90 tablet 3   furosemide (LASIX) 40 MG tablet Take 1 tablet (40 mg total) by mouth daily as needed for edema or fluid. 30 tablet 2   No facility-administered medications prior to visit.    Allergies  Allergen Reactions    Statins Other (See Comments)    Weakness/myalgia    Vicks Nyquil Cough [Doxylamine-Dm]     Felt like skin was crawling   Other Rash    Had rash on arm after pneumonia and flu vaccine together - never had either separately prior to this reaction.  Zquil - "felt like his skin was crawling"     Review of Systems As per HPI  PE:    04/18/2023    1:28 PM 03/27/2023    8:26 AM 02/20/2023   11:11 AM  Vitals with BMI  Height  5' 11.26" 6\' 1"   Weight 146 lbs 146 lbs 6 oz 147 lbs 3 oz  BMI 20.21 20.27 19.42  Systolic 98 92 115  Diastolic 66 57 70  Pulse 61 56 56   Physical Exam  Gen: Alert, well appearing.  Patient is oriented to person, place, time, and situation. AFFECT: pleasant, lucid thought and speech. EXT: no edema L foot lateral and distal area of 5th metatarsal with moderate size spur with callus, soft and mildly tender.   LABS:  Last CBC Lab Results  Component Value Date   WBC 5.8 03/27/2023   HGB 14.8 03/27/2023   HCT 44.8 03/27/2023   MCV 93.9 03/27/2023   MCH 31.5 01/21/2023   RDW 15.6 (H) 03/27/2023   PLT 207.0 03/27/2023   Last metabolic panel Lab Results  Component Value Date   GLUCOSE 95 04/09/2023   NA 139 04/09/2023   K 4.4 04/09/2023   CL 101 04/09/2023   CO2 30 04/09/2023   BUN 29 (H) 04/09/2023   CREATININE 1.28 04/09/2023   GFRNONAA >60 02/03/2023   CALCIUM 9.2 04/09/2023   PHOS 3.8 01/13/2023   PROT 7.7 03/27/2023   ALBUMIN 4.3 03/27/2023   BILITOT 0.6 03/27/2023   ALKPHOS 94 03/27/2023   AST 23 03/27/2023   ALT 6 03/27/2023   ANIONGAP 13 02/03/2023   Last lipids Lab Results  Component Value Date   CHOL 207 (H) 03/27/2023   HDL 64.60 03/27/2023   LDLCALC 124 (H) 03/27/2023   TRIG 91.0 03/27/2023   CHOLHDL 3 03/27/2023   Last hemoglobin A1c Lab Results  Component Value Date   HGBA1C 5.6 01/13/2023   Last thyroid functions Lab Results  Component Value Date   TSH 3.404 01/09/2023   IMPRESSION AND PLAN:  #1 bilateral  lower extremity edema, resolved. This was a result of ischemic cardiomyopathy. EF has now normalized, not requiring any furosemide.  2.  Chronic renal insufficiency stage III. Recent bump in serum creatinine: Suspect this was from recent acute CHF followed by a period of diuresis with Lasix.  Creatinine improved on follow-up testing--baseline GFR in the 50s. Avoiding NSAIDs. Monitor basic metabolic panel today.  #3 PAF. Doing well on amiodarone 200 mg a day and Eliquis 5 mg twice a day. Will monitor TSH in a  few months.  #4 chronic bilateral knee pain, suspect significant osteoarthritis. He had some questions about viscosupplementation today.  We have discussed possible referral to orthopedics in the future. He wants to hold off on everything right now.  #5 chronic constipation.  Doing well with Senokot and milk of magnesia.  #6 left foot callus. Will refer to podiatry.  An After Visit Summary was printed and given to the patient.  FOLLOW UP: 3 mo Cpe Mar/April 2025  Signed:  Santiago Bumpers, MD           04/18/2023

## 2023-05-05 DIAGNOSIS — G20A1 Parkinson's disease without dyskinesia, without mention of fluctuations: Secondary | ICD-10-CM | POA: Diagnosis not present

## 2023-05-05 DIAGNOSIS — L989 Disorder of the skin and subcutaneous tissue, unspecified: Secondary | ICD-10-CM | POA: Diagnosis not present

## 2023-05-05 DIAGNOSIS — R490 Dysphonia: Secondary | ICD-10-CM | POA: Diagnosis not present

## 2023-05-05 DIAGNOSIS — R1312 Dysphagia, oropharyngeal phase: Secondary | ICD-10-CM | POA: Diagnosis not present

## 2023-05-05 DIAGNOSIS — R1314 Dysphagia, pharyngoesophageal phase: Secondary | ICD-10-CM | POA: Diagnosis not present

## 2023-05-06 ENCOUNTER — Ambulatory Visit: Payer: Medicare Other | Admitting: Podiatry

## 2023-05-06 DIAGNOSIS — M79672 Pain in left foot: Secondary | ICD-10-CM | POA: Diagnosis not present

## 2023-05-06 DIAGNOSIS — M21962 Unspecified acquired deformity of left lower leg: Secondary | ICD-10-CM | POA: Diagnosis not present

## 2023-05-06 DIAGNOSIS — L84 Corns and callosities: Secondary | ICD-10-CM | POA: Diagnosis not present

## 2023-05-06 NOTE — Progress Notes (Signed)
Chief Complaint  Patient presents with   Callouses    Left 5th digit, lateral side     HPI: 82 y.o. male presenting today with concern of a chronic painful recurring callus near the fifth toe on the bottom of the left foot.  He states that it is difficult for him to see the area himself.  Denies any drainage from the area.  He notes he gets very milder callus on the right foot but did not feel it needed attention today.  HPI   Past Medical History:  Diagnosis Date   Atrial fibrillation ([facility])    12/2022->converted in hosp on amio.  Eliquis.   BPH with obstruction/lower urinary tract symptoms    orthostatic hypotension on flomax   CAD (coronary artery disease)    Cath 12/2022: chronic LAD occlusion, no stent   Chronic hoarseness    Chronic renal insufficiency, stage 3 (moderate) ([facility])    Eczema    Legs.  Triamcinolone   GERD (gastroesophageal reflux disease)    Ischemic cardiomyopathy    01/10/23: EF 25-30%, severe LV dysfxn, regional WMA   OSA (obstructive sleep apnea)    quit cpap in 2017   Osteoarthritis, multiple sites    Paget's disease of bone    Zoledronic acid 2002 and again in 2012.  Monitoring alkaline phosphatase levels.   Parkinson's disease    Diagnosed 2018   TIA (transient ischemic attack)    12/2022 (R facial droop, dysarthria) when in hosp for a-fib/chf    Past Surgical History:  Procedure Laterality Date   carotid dopplers     12/2022. 1-39% bilat   COLONOSCOPY  2015   2015 normal--no further screening indicated   INGUINAL HERNIA REPAIR Right 07/14/2018   Procedure: OPEN RIGHT INGUINAL HERNIA REPAIR ERAS PATHWAY;  Surgeon: Claud Kelp, MD;  Location: [facility];  Service: General;  Laterality: Right;   INSERTION OF MESH Right 07/14/2018   Procedure: INSERTION OF MESH;  Surgeon: Claud Kelp, MD;  Location: [facility];  Service: General;  Laterality: Right;   KNEE SURGERY Right 2007   arthroscopic   RIGHT/LEFT  HEART CATH AND CORONARY ANGIOGRAPHY N/A 01/13/2023   Chronic LAD occlusion, with collateral flow->med mgmt. Procedure: RIGHT/LEFT HEART CATH AND CORONARY ANGIOGRAPHY;  Surgeon: Kathleene Hazel, MD;  [facility];  Service: Cardiovascular;  Laterality: N/A;   TRANSTHORACIC ECHOCARDIOGRAM     01/10/23: EF 25-30%, severe LV dysfxn, regional WMA   TRANSTHORACIC ECHOCARDIOGRAM     03/24/23 EF 55-60%, normal WM, normal valves, mild aortic dilatation   VASECTOMY  1975    Allergies  Allergen Reactions   Statins Other (See Comments)    Weakness/myalgia    Vicks Nyquil Cough [Doxylamine-Dm]     Felt like skin was crawling   Other Rash    Had rash on arm after pneumonia and flu vaccine together - never had either separately prior to this reaction.  Zquil - "felt like his skin was crawling"        Physical Exam:  General: The patient is alert and oriented x3 in no acute distress.  Dermatology: Skin is warm, dry and supple bilateral lower extremities. Interspaces are clear of maceration and debris.  There is a moderate-sized hyperkeratotic lesion on the plantar aspect of the left fifth metatarsal head.  There is some hemorrhage within the callus.  However there is no opening or drainage noted.  No surrounding erythema or edema.  Vascular: Palpable pedal pulses bilaterally. Capillary refill within normal limits.  No appreciable edema.  No erythema or calor.  Neurological: Light touch sensation grossly intact bilateral feet.   Musculoskeletal Exam: Splayfoot deformity left foot.  Lateral and plantar deviation of the fifth metatarsal head consistent with bunionette.  There is medial deviation of the fifth toe at the level of the MPJ.  Pain on palpation of the plantar aspect of the fifth metatarsal head  Assessment/Plan of Care: 1.  Deformity of left foot. 2.  Painful plantar callus left submet 5.   I discussed the possible etiology of the patient's painful callus  consistent with his foot deformity, bone structure, biomechanics of walking.  With his consent the hyperkeratotic tissue was shaved with a sterile #312 blade.  An offloading felt pad was placed on the underside of his shoe and insole to decrease pressure on the area.  He was informed that this will eventually flatten out and either need replaced or we could progress to a custom orthotic which would have a more durable offloading material placed on the area and lessen the chance for recurrence in the future.  He understood.  Follow-up as needed.    Clerance Lav, DPM, FACFAS [facility]     [address] [place], [place] [address]                Office [phone]  Fax [phone]

## 2023-05-25 NOTE — Progress Notes (Unsigned)
Cardiology Office Note:    Date:  05/29/2023   ID:  Mario George, DOB Aug 25, 1941, MRN [id number]  PCP:  Jeoffrey Massed, MD   [facility] Providers Cardiologist:  Olga Millers, MD Cardiology APP:  Marcelino Duster, PA     Referring MD: Jeoffrey Massed, MD   Chief Complaint  Patient presents with   Follow-up    CHF    History of Present Illness:    Mario George is a 82 y.o. male with a hx of Mario George is a 82 y.o. 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  He was diagnosed with Parkinson's disease in 2018 and follows with neurology.  He walks independently but does have freezing of gait and postural instability.  He has a history of orthostatic hypotension and was on Flomax for BPH which has been discontinued.  Mario George had no prior cardiac history when cardiology was asked to evaluate during hospitalization 01/10/2023.  He presented with symptoms of hypervolemia and found to be in A-fib RVR.  This was a new diagnosis of A-fib. However, he states he has had Afib in the past but through shared decision making with the patient he was not anticoagulated (does not like taking medications.  Echocardiogram showed an LVEF of 25-30% with regional wall motion abnormality.  Given this new diagnosis of CHF with wall motion abnormality, he underwent right and left heart catheterization on 01/13/2023 which showed CTO of the proximal LAD with collaterals. Medical management was recommended. Normal right and left heart pressures. LVEDP 11 mmHg and wedge was 13 mmHg. Amiodarone was started for rate control and TEE-guided DCCV was planned. However, he converted to NSR and was discharged on 01/14/23 on eliquis. GDMT titrated slowly given orthostatic hypotension.   He was seen by [facility] 01/21/23. GDMT: 12.5 mg losartan, 12.5 mg  spironolactone, 10 mg farxiga, PRN 40 mg lasix  I saw him in follow-up 01/22/2023.  Unfortunately he was unable to tolerate losartan secondary to hypotension and dizziness.  This was discontinued on 01/28/2023 by AHF via phone note. I saw him in Feb 2024 and he was doing well.   He presents today for follow up and review of his echo. Overall, no syncope or episodes of hypotension. Doing very well on farxiga and 12.5 mg spironolactone with recent stable BMP. He asks me to review his cardiac problems and medications, all questions answered.   Past Medical History:  Diagnosis Date   Atrial fibrillation (HCC)    12/2022->converted in hosp on amio.  Eliquis.   BPH with obstruction/lower urinary tract symptoms    orthostatic hypotension on flomax   CAD (coronary artery disease)    Cath 12/2022: chronic LAD occlusion, no stent   Chronic hoarseness    Chronic renal insufficiency, stage 3 (moderate) (HCC)    Eczema    Legs.  Triamcinolone   GERD (gastroesophageal reflux disease)    Ischemic cardiomyopathy    01/10/23: EF 25-30%, severe LV dysfxn, regional WMA   OSA (obstructive sleep apnea)    quit cpap in 2017   Osteoarthritis, multiple sites    Paget's disease of bone    Zoledronic acid 2002 and again in 2012.  Monitoring alkaline phosphatase levels.   Parkinson's disease    Diagnosed 2018   TIA (transient ischemic attack)    12/2022 (R facial  droop, dysarthria) when in hosp for a-fib/chf    Past Surgical History:  Procedure Laterality Date   carotid dopplers     12/2022. 1-39% bilat   COLONOSCOPY  2015   2015 normal--no further screening indicated   INGUINAL HERNIA REPAIR Right 07/14/2018   Procedure: OPEN RIGHT INGUINAL HERNIA REPAIR ERAS PATHWAY;  Surgeon: Claud Kelp, MD;  Location: [facility];  Service: General;  Laterality: Right;   INSERTION OF MESH Right 07/14/2018   Procedure: INSERTION OF MESH;  Surgeon: Claud Kelp, MD;  Location: [facility];  Service: General;  Laterality: Right;   KNEE SURGERY Right 2007   arthroscopic   RIGHT/LEFT HEART CATH AND CORONARY ANGIOGRAPHY N/A 01/13/2023   Chronic LAD occlusion, with collateral flow->med mgmt. Procedure: RIGHT/LEFT HEART CATH AND CORONARY ANGIOGRAPHY;  Surgeon: Kathleene Hazel, MD;  Location: [facility];  Service: Cardiovascular;  Laterality: N/A;   TRANSTHORACIC ECHOCARDIOGRAM     01/10/23: EF 25-30%, severe LV dysfxn, regional WMA   TRANSTHORACIC ECHOCARDIOGRAM     03/24/23 EF 55-60%, normal WM, normal valves, mild aortic dilatation   VASECTOMY  1975    Current Medications: Current Meds  Medication Sig   acetaminophen (TYLENOL) 325 MG tablet Take 650 mg by mouth as needed for moderate pain.   amiodarone (PACERONE) 200 MG tablet Take 1 tablet (200 mg total) by mouth daily.   apixaban (ELIQUIS) 5 MG TABS tablet Take 1 tablet (5 mg total) by mouth 2 (two) times daily.   carbidopa-levodopa (SINEMET CR) 50-200 MG tablet TAKE 1 TABLET BY MOUTH AT  BEDTIME   carbidopa-levodopa (SINEMET IR) 25-100 MG tablet Take 2 tablets by mouth 4 (four) times daily.   dapagliflozin propanediol (FARXIGA) 10 MG TABS tablet Take 1 tablet (10 mg total) by mouth daily.   Multiple Vitamin (MULTIVITAMIN ADULT PO) Take 2 tablets by mouth daily.   Psyllium (METAMUCIL PO) Take 10-15 mLs by mouth daily.   spironolactone (ALDACTONE) 25 MG tablet Take 1/2 tablet (12.5 mg total) by mouth daily.     Allergies:   Statins, Vicks nyquil cough [doxylamine-dm], and Other   Social History   Socioeconomic History   Marital status: Married    Spouse name: Boneta Lucks   Number of children: 3   Years of education: Not on file   Highest education level: Bachelor's degree (e.g., BA, AB, BS)  Occupational History   Occupation: retired    Comment: health care admin/hospital  Tobacco Use   Smoking status: Former    Types: Cigarettes    Quit date: 12/30/1982    Years since quitting: 40.4   Smokeless  tobacco: Never  Vaping Use   Vaping Use: Never used  Substance and Sexual Activity   Alcohol use: Yes    Comment: one drink (wine or beer) daily   Drug use: No   Sexual activity: Not Currently  Other Topics Concern   Not on file  Social History Narrative   Married, several children, also grandchildren   Originally from [place].     Moved to [facility] April 2023.   Educ: College   Occup: retired Presenter, broadcasting   No T/A/Ds   Social Determinants of Corporate investment banker Strain: Medium Risk (01/14/2023)   Overall Financial Resource Strain (CARDIA)    Difficulty of Paying Living Expenses: Somewhat hard  Food Insecurity: No Food Insecurity (01/15/2023)   Hunger Vital Sign    Worried About Running Out of Food in the Last Year:  Never true    Ran Out of Food in the Last Year: Never true  Transportation Needs: No Transportation Needs (01/15/2023)   PRAPARE - Administrator, Civil Service (Medical): No    Lack of Transportation (Non-Medical): No  Physical Activity: Sufficiently Active (11/06/2022)   Exercise Vital Sign    Days of Exercise per Week: 5 days    Minutes of Exercise per Session: 30 min  Stress: No Stress Concern Present (11/06/2022)   [employer] of Occupational Health - Occupational Stress Questionnaire    Feeling of Stress : Not at all  Social Connections: Moderately Isolated (11/06/2022)   Social Connection and Isolation Panel [NHANES]    Frequency of Communication with Friends and Family: More than three times a week    Frequency of Social Gatherings with Friends and Family: Once a week    Attends Religious Services: Never    Database administrator or Organizations: No    Attends Engineer, structural: Never    Marital Status: Married     Family History: The patient's family history includes Alcohol abuse in his father and mother; Arthritis in his father and paternal grandmother; Cancer in his mother; Early death in his child and  mother; Kidney disease in his sister; Liver disease in his father; Stroke in his brother, sister, and sister.  ROS:   Please see the history of present illness.     All other systems reviewed and are negative.  EKGs/Labs/Other Studies Reviewed:    The following studies were reviewed today:  Cardiac Studies & Procedures   CARDIAC CATHETERIZATION  CARDIAC CATHETERIZATION 01/13/2023  Narrative   Prox RCA lesion is 20% stenosed.   Prox LAD lesion is 99% stenosed.   Prox LAD to Mid LAD lesion is 100% stenosed.  Chronic total occlusion proximal LAD. The mid and distal vessel fills from right to left collaterals. Large dominant Circumflex with no obstructive disease. Moderate caliber non-dominant RCA with mild plaque. This vessel supplies the mid and distal LAD. LVEDP 11 mmHg PCWP  13 mmHg  Recommendations: Medical management of CAD. His LAD is chronically occluded and fills from right to left collaterals. No indication for PCI at this time. Normal right and left pressures.  Findings Coronary Findings Diagnostic  Dominance: Left  Left Anterior Descending Collaterals Dist LAD filled by collaterals from Dist RCA.  Prox LAD lesion is 99% stenosed. Prox LAD to Mid LAD lesion is 100% stenosed. The lesion is chronically occluded.  First Diagonal Branch Vessel is small in size.  Left Circumflex  First Obtuse Marginal Branch Vessel is moderate in size.  Right Coronary Artery Vessel is moderate in size. Prox RCA lesion is 20% stenosed.  Intervention  No interventions have been documented.     ECHOCARDIOGRAM  ECHOCARDIOGRAM COMPLETE 03/24/2023  Narrative ECHOCARDIOGRAM REPORT    Patient Name:   RADLEY TWING   Date of Exam: 03/24/2023 Medical Rec #:  [id number]     Height:       73.0 in Accession #:    [id number]    Weight:       147.2 lb Date of Birth:  Jul 28, 1941      BSA:          1.889 m Patient Age:    82 years      BP:           109/66 mmHg Patient Gender: M  HR:           63 bpm. Exam Location:  [address]  Procedure: 2D Echo, 3D Echo, Cardiac Doppler and Color Doppler  Indications:    I25.5 Ischemic Cardiomyopathy  History:        Patient has prior history of Echocardiogram examinations, most recent 01/10/2023. Heart failure, TIA; Arrythmias:Atrial Fibrillation.  Sonographer:    Clearence Ped RCS Referring Phys: [id number] Kaemon Barnett NICOLE Tyric Rodeheaver  IMPRESSIONS   1. Left ventricular ejection fraction, by estimation, is 55 to 60%. The left ventricle has normal function. The left ventricle has no regional wall motion abnormalities. Left ventricular diastolic parameters were normal. 2. Right ventricular systolic function is normal. The right ventricular size is normal. There is normal pulmonary artery systolic pressure. The estimated right ventricular systolic pressure is 22.4 mmHg. 3. Right atrial size was mildly dilated. 4. The mitral valve is normal in structure. Mild mitral valve regurgitation. No evidence of mitral stenosis. 5. The aortic valve is tricuspid. Aortic valve regurgitation is not visualized. No aortic stenosis is present. 6. Aortic dilatation noted. There is mild dilatation of the ascending aorta, measuring 39 mm. 7. The inferior vena cava is normal in size with greater than 50% respiratory variability, suggesting right atrial pressure of 3 mmHg.  Comparison(s): Compared to prior echo 01/10/23, LV systolic function has significantly improved.  FINDINGS Left Ventricle: Left ventricular ejection fraction, by estimation, is 55 to 60%. The left ventricle has normal function. The left ventricle has no regional wall motion abnormalities. The left ventricular internal cavity size was normal in size. There is no left ventricular hypertrophy. Left ventricular diastolic parameters were normal.  Right Ventricle: The right ventricular size is normal. No increase in right ventricular wall thickness. Right ventricular systolic function is  normal. There is normal pulmonary artery systolic pressure. The tricuspid regurgitant velocity is 2.20 m/s, and with an assumed right atrial pressure of 3 mmHg, the estimated right ventricular systolic pressure is 22.4 mmHg.  Left Atrium: Left atrial size was normal in size.  Right Atrium: Right atrial size was mildly dilated.  Pericardium: There is no evidence of pericardial effusion.  Mitral Valve: The mitral valve is normal in structure. Mild mitral valve regurgitation. No evidence of mitral valve stenosis.  Tricuspid Valve: The tricuspid valve is normal in structure. Tricuspid valve regurgitation is mild.  Aortic Valve: The aortic valve is tricuspid. Aortic valve regurgitation is not visualized. No aortic stenosis is present.  Pulmonic Valve: The pulmonic valve was not well visualized. Pulmonic valve regurgitation is not visualized.  Aorta: The aortic root is normal in size and structure and aortic dilatation noted. There is mild dilatation of the ascending aorta, measuring 39 mm.  Venous: The inferior vena cava is normal in size with greater than 50% respiratory variability, suggesting right atrial pressure of 3 mmHg.  IAS/Shunts: The interatrial septum was not well visualized.   LEFT VENTRICLE PLAX 2D LVIDd:         4.30 cm   Diastology LVIDs:         2.80 cm   LV e' medial:    11.90 cm/s LV PW:         0.70 cm   LV E/e' medial:  6.0 LV IVS:        1.00 cm   LV e' lateral:   9.25 cm/s LVOT diam:     2.00 cm   LV E/e' lateral: 7.7 LV SV:         68 LV  SV Index:   36 LVOT Area:     3.14 cm  3D Volume EF: 3D EF:        53 % LV EDV:       138 ml LV ESV:       64 ml LV SV:        73 ml  RIGHT VENTRICLE RV Basal diam:  4.10 cm RV Mid diam:    3.80 cm RV S prime:     14.40 cm/s TAPSE (M-mode): 4.0 cm RVSP:           22.4 mmHg  LEFT ATRIUM             Index        RIGHT ATRIUM           Index LA diam:        4.50 cm 2.38 cm/m   RA Pressure: 3.00 mmHg LA Vol (A2C):    44.3 ml 23.45 ml/m  RA Area:     20.20 cm LA Vol (A4C):   47.7 ml 25.25 ml/m  RA Volume:   59.90 ml  31.71 ml/m LA Biplane Vol: 50.1 ml 26.52 ml/m AORTIC VALVE LVOT Vmax:   90.20 cm/s LVOT Vmean:  61.700 cm/s LVOT VTI:    0.216 m  AORTA Ao Root diam: 3.10 cm Ao Asc diam:  3.90 cm  MITRAL VALVE               TRICUSPID VALVE MV Area (PHT):             TR Peak grad:   19.4 mmHg MV Decel Time:             TR Vmax:        220.00 cm/s MV E velocity: 71.30 cm/s  Estimated RAP:  3.00 mmHg MV A velocity: 58.70 cm/s  RVSP:           22.4 mmHg MV E/A ratio:  1.21 SHUNTS Systemic VTI:  0.22 m Systemic Diam: 2.00 cm  Epifanio Lesches MD Electronically signed by Epifanio Lesches MD Signature Date/Time: 03/24/2023/4:43:23 PM    Final               EKG:  EKG is not ordered today.    Recent Labs: 01/09/2023: TSH 3.404 01/13/2023: Magnesium 2.1 02/03/2023: B Natriuretic Peptide 554.9 03/27/2023: ALT 6; Hemoglobin 14.8; Platelets 207.0 04/18/2023: BUN 31; Creatinine, Ser 1.21; Potassium 4.7; Sodium 137  Recent Lipid Panel    Component Value Date/Time   CHOL 207 (H) 03/27/2023 0911   TRIG 91.0 03/27/2023 0911   HDL 64.60 03/27/2023 0911   CHOLHDL 3 03/27/2023 0911   VLDL 18.2 03/27/2023 0911   LDLCALC 124 (H) 03/27/2023 0911     Risk Assessment/Calculations:    CHA2DS2-VASc Score = 3   This indicates a 3.2% annual risk of stroke. The patient's score is based upon: CHF History: 1 HTN History: 0 Diabetes History: 0 Stroke History: 0 Vascular Disease History: 1 Age Score: 2 Gender Score: 0            Physical Exam:    VS:  BP 102/74 (BP Location: Right Arm, Patient Position: Sitting, Cuff Size: Normal)   Pulse (!) 57   Ht 6\' 1"  (1.854 m)   Wt 144 lb 9.6 oz (65.6 kg)   SpO2 97%   BMI 19.08 kg/m     Wt Readings from Last 3 Encounters:  05/29/23 144 lb 9.6 oz (65.6 kg)  04/18/23 146 lb (  66.2 kg)  03/27/23 146 lb 6.4 oz (66.4 kg)     GEN:  Well  nourished, well developed in no acute distress HEENT: Normal NECK: No JVD; No carotid bruits LYMPHATICS: No lymphadenopathy CARDIAC: RRR, no murmurs, rubs, gallops RESPIRATORY:  Clear to auscultation without rales, wheezing or rhonchi  ABDOMEN: Soft, non-tender, non-distended MUSCULOSKELETAL:  No edema; No deformity  SKIN: Warm and dry NEUROLOGIC:  Alert and oriented x 3 PSYCHIATRIC:  Normal affect   ASSESSMENT:    1. Paroxysmal atrial fibrillation (HCC)   2. On amiodarone therapy   3. Chronic anticoagulation   4. HFrEF (heart failure with reduced ejection fraction) (HCC)   5. Coronary artery disease involving native coronary artery of native heart without angina pectoris   6. Hyperlipidemia with target LDL less than 70   7. Parkinson's disease, unspecified whether dyskinesia present, unspecified whether manifestations fluctuate    PLAN:    In order of problems listed above:  Afib RVR PAF EKG at last visit with SB Remains on amiodarone 200 mg daily Hold BB given orthostatic hypotension and sinus bradycardia   Amiodarone therapy TSH WNL (3.404) CXR at admission Taper: now on 200 mg daily - will ask PCP to collect TSH and LFTs at next visit in July - every 6 months for surveillance of amiodarone therapy - has been on this medication for about 4 months - no breathing complaints, will defer CXR until the 1 year mark   Chronic anticoagulation No signs of active bleeding on eliquis Will need to watch weight (67 kg) - was 63 kg at last visit - he will notify us if he reaches 135 lb range   Chronic systolic heart failure LVEF 25-30%, now 55-60% GDMT as above Did not tolerate low dose losartan Patient assistance submitted for farxiga Echo was repeated 02/2023 and showed EF has normalized Mild MR will be followed clinically Currently on farxiga and 12.5 mg spironolactone  - BMP stable   CAD CTO of LAD with collaterals No antiplatelet in the setting of eliquis No BB  given orthostatic hypotension No chest pain   Hyperlipidemia with LDL goal < 70 01/13/2023: Cholesterol 153; HDL 47; LDL Cholesterol 91; Triglycerides 73; VLDL 15 No statin due to weakness and myalgias Referred to lipid pharmD for PCSK9i - did this occur?   Parkinson's disease Stable on current regimen No falls   Follow up in 6 months.       Medication Adjustments/Labs and Tests Ordered: Current medicines are reviewed at length with the patient today.  Concerns regarding medicines are outlined above.  No orders of the defined types were placed in this encounter.  No orders of the defined types were placed in this encounter.   Patient Instructions  Medication Instructions:  No changes *If you need a refill on your cardiac medications before your next appointment, please call your pharmacy*   Lab Work: Need TSH and CMET- Needed when you have PCP visit for Amiodarone therapy If you have labs (blood work) drawn today and your tests are completely normal, you will receive your results only by: MyChart Message (if you have MyChart) OR A paper copy in the mail If you have any lab test that is abnormal or we need to change your treatment, we will call you to review the results.  Follow-Up: At [facility], you and your health needs are our priority.  As part of our continuing mission to provide you with exceptional heart care, we have created designated Provider  Care Teams.  These Care Teams include your primary Cardiologist (physician) and Advanced Practice Providers (APPs -  Physician Assistants and Nurse Practitioners) who all work together to provide you with the care you need, when you need it.  We recommend signing up for the patient portal called "MyChart".  Sign up information is provided on this After Visit Summary.  MyChart is used to connect with patients for Virtual Visits (Telemedicine).  Patients are able to view lab/test results, encounter notes, upcoming  appointments, etc.  Non-urgent messages can be sent to your provider as well.   To learn more about what you can do with MyChart, go to [email or web address].    Your next appointment:    6 month(s)  Provider:   Dr Jens Som    Signed, Marcelino Duster, [place]  05/29/2023 3:42 PM    [facility]

## 2023-05-29 ENCOUNTER — Ambulatory Visit: Payer: Medicare Other | Attending: Physician Assistant | Admitting: Physician Assistant

## 2023-05-29 ENCOUNTER — Encounter: Payer: Self-pay | Admitting: Physician Assistant

## 2023-05-29 VITALS — BP 102/74 | HR 57 | Ht 73.0 in | Wt 144.6 lb

## 2023-05-29 DIAGNOSIS — I480 Paroxysmal atrial fibrillation: Secondary | ICD-10-CM | POA: Diagnosis not present

## 2023-05-29 DIAGNOSIS — Z7901 Long term (current) use of anticoagulants: Secondary | ICD-10-CM | POA: Diagnosis not present

## 2023-05-29 DIAGNOSIS — I2510 Atherosclerotic heart disease of native coronary artery without angina pectoris: Secondary | ICD-10-CM

## 2023-05-29 DIAGNOSIS — Z79899 Other long term (current) drug therapy: Secondary | ICD-10-CM

## 2023-05-29 DIAGNOSIS — E785 Hyperlipidemia, unspecified: Secondary | ICD-10-CM

## 2023-05-29 DIAGNOSIS — G20A1 Parkinson's disease without dyskinesia, without mention of fluctuations: Secondary | ICD-10-CM

## 2023-05-29 DIAGNOSIS — I5020 Unspecified systolic (congestive) heart failure: Secondary | ICD-10-CM | POA: Diagnosis not present

## 2023-05-29 NOTE — Patient Instructions (Signed)
Medication Instructions:  No changes *If you need a refill on your cardiac medications before your next appointment, please call your pharmacy*   Lab Work: Need TSH and CMET- Needed when you have PCP visit for Amiodarone therapy If you have labs (blood work) drawn today and your tests are completely normal, you will receive your results only by: MyChart Message (if you have MyChart) OR A paper copy in the mail If you have any lab test that is abnormal or we need to change your treatment, we will call you to review the results.  Follow-Up: At [facility], you and your health needs are our priority.  As part of our continuing mission to provide you with exceptional heart care, we have created designated Provider Care Teams.  These Care Teams include your primary Cardiologist (physician) and Advanced Practice Providers (APPs -  Physician Assistants and Nurse Practitioners) who all work together to provide you with the care you need, when you need it.  We recommend signing up for the patient portal called "MyChart".  Sign up information is provided on this After Visit Summary.  MyChart is used to connect with patients for Virtual Visits (Telemedicine).  Patients are able to view lab/test results, encounter notes, upcoming appointments, etc.  Non-urgent messages can be sent to your provider as well.   To learn more about what you can do with MyChart, go to [email or web address].    Your next appointment:    6 month(s)  Provider:   Dr Jens Som

## 2023-07-15 NOTE — Patient Instructions (Signed)
It was very nice to see you today!   PLEASE NOTE:   If you had any lab tests please let us know if you have not heard back within a few days. You may see your results on MyChart before we have a chance to review them but we will give you a call once they are reviewed by us. If we ordered any referrals today, please let us know if you have not heard from their office within the next 2 weeks. You should receive a letter via MyChart confirming if the referral was approved and their office contact information to schedule.  

## 2023-07-18 ENCOUNTER — Ambulatory Visit (INDEPENDENT_AMBULATORY_CARE_PROVIDER_SITE_OTHER): Payer: Medicare Other | Admitting: Family Medicine

## 2023-07-18 ENCOUNTER — Encounter: Payer: Self-pay | Admitting: Family Medicine

## 2023-07-18 VITALS — BP 123/78 | HR 55 | Wt 144.0 lb

## 2023-07-18 DIAGNOSIS — L989 Disorder of the skin and subcutaneous tissue, unspecified: Secondary | ICD-10-CM

## 2023-07-18 DIAGNOSIS — I428 Other cardiomyopathies: Secondary | ICD-10-CM | POA: Diagnosis not present

## 2023-07-18 DIAGNOSIS — Z79899 Other long term (current) drug therapy: Secondary | ICD-10-CM | POA: Diagnosis not present

## 2023-07-18 DIAGNOSIS — I480 Paroxysmal atrial fibrillation: Secondary | ICD-10-CM | POA: Diagnosis not present

## 2023-07-18 DIAGNOSIS — N183 Chronic kidney disease, stage 3 unspecified: Secondary | ICD-10-CM

## 2023-07-18 DIAGNOSIS — H6122 Impacted cerumen, left ear: Secondary | ICD-10-CM

## 2023-07-18 NOTE — Progress Notes (Signed)
OFFICE VISIT  07/18/2023  CC:  Chief Complaint  Patient presents with   Follow-up    3 month Rci. Wants a podiatry referral.    Patient is a 82 y.o. male who presents for 3 mo f/u PAF and CRI III.  INTERIM HX: Feeling well.   L ear feels very full, dec hearing. No palpitations/racing heart/SOB/CP or dizziness.  NO edema. Dry lesion on face recently.  Past Medical History:  Diagnosis Date   Atrial fibrillation (HCC)    12/2022->converted in hosp on amio.  Eliquis.   BPH with obstruction/lower urinary tract symptoms    orthostatic hypotension on flomax   CAD (coronary artery disease)    Cath 12/2022: chronic LAD occlusion, no stent   Chronic hoarseness    Chronic renal insufficiency, stage 3 (moderate) (HCC)    Eczema    Legs.  Triamcinolone   GERD (gastroesophageal reflux disease)    Ischemic cardiomyopathy    01/10/23: EF 25-30%, severe LV dysfxn, regional WMA   OSA (obstructive sleep apnea)    quit cpap in 2017   Osteoarthritis, multiple sites    Paget's disease of bone    Zoledronic acid 2002 and again in 2012.  Monitoring alkaline phosphatase levels.   Parkinson's disease    Diagnosed 2018   TIA (transient ischemic attack)    12/2022 (R facial droop, dysarthria) when in hosp for a-fib/chf    Past Surgical History:  Procedure Laterality Date   carotid dopplers     12/2022. 1-39% bilat   COLONOSCOPY  2015   2015 normal--no further screening indicated   INGUINAL HERNIA REPAIR Right 07/14/2018   Procedure: OPEN RIGHT INGUINAL HERNIA REPAIR ERAS PATHWAY;  Surgeon: Claud Kelp, MD;  Location: [facility];  Service: General;  Laterality: Right;   INSERTION OF MESH Right 07/14/2018   Procedure: INSERTION OF MESH;  Surgeon: Claud Kelp, MD;  Location: [facility];  Service: General;  Laterality: Right;   KNEE SURGERY Right 2007   arthroscopic   RIGHT/LEFT HEART CATH AND CORONARY ANGIOGRAPHY N/A 01/13/2023   Chronic LAD occlusion, with  collateral flow->med mgmt. Procedure: RIGHT/LEFT HEART CATH AND CORONARY ANGIOGRAPHY;  Surgeon: Kathleene Hazel, MD;  [facility];  Service: Cardiovascular;  Laterality: N/A;   TRANSTHORACIC ECHOCARDIOGRAM     01/10/23: EF 25-30%, severe LV dysfxn, regional WMA   TRANSTHORACIC ECHOCARDIOGRAM     03/24/23 EF 55-60%, normal WM, normal valves, mild aortic dilatation   VASECTOMY  1975    Outpatient Medications Prior to Visit  Medication Sig Dispense Refill   acetaminophen (TYLENOL) 325 MG tablet Take 650 mg by mouth as needed for moderate pain.     amiodarone (PACERONE) 200 MG tablet Take 1 tablet (200 mg total) by mouth daily. 90 tablet 3   apixaban (ELIQUIS) 5 MG TABS tablet Take 1 tablet (5 mg total) by mouth 2 (two) times daily. 180 tablet 3   carbidopa-levodopa (SINEMET CR) 50-200 MG tablet TAKE 1 TABLET BY MOUTH AT  BEDTIME 90 tablet 1   carbidopa-levodopa (SINEMET IR) 25-100 MG tablet Take 2 tablets by mouth 4 (four) times daily. 720 tablet 0   dapagliflozin propanediol (FARXIGA) 10 MG TABS tablet Take 1 tablet (10 mg total) by mouth daily. 90 tablet 3   Multiple Vitamin (MULTIVITAMIN ADULT PO) Take 2 tablets by mouth daily.     Psyllium (METAMUCIL PO) Take 10-15 mLs by mouth daily.     spironolactone (ALDACTONE) 25 MG tablet Take 1/2  tablet (12.5 mg total) by mouth daily. 90 tablet 3   furosemide (LASIX) 40 MG tablet Take 1 tablet (40 mg total) by mouth daily as needed for edema or fluid. 30 tablet 2   No facility-administered medications prior to visit.    Allergies  Allergen Reactions   Statins Other (See Comments)    Weakness/myalgia    Vicks Nyquil Cough [Doxylamine-Dm]     Felt like skin was crawling   Other Rash    Had rash on arm after pneumonia and flu vaccine together - never had either separately prior to this reaction.  Zquil - "felt like his skin was crawling"     Review of Systems As per HPI  PE:    07/18/2023    1:25 PM 05/29/2023     1:24 PM 04/18/2023    1:28 PM  Vitals with BMI  Height  6\' 1"    Weight 144 lbs 144 lbs 10 oz 146 lbs  BMI  19.08 20.21  Systolic 123 102 98  Diastolic 78 74 66  Pulse 55 57 61     Physical Exam  Gen: Alert, well appearing.  Patient is oriented to person, place, time, and situation. AFFECT: pleasant, lucid thought and speech. R EAC clear, TM normal. L EAC 100% cerumen impaction SKIN: L cheek with small pink crusted papule. LABS:  Last CBC Lab Results  Component Value Date   WBC 5.8 03/27/2023   HGB 14.8 03/27/2023   HCT 44.8 03/27/2023   MCV 93.9 03/27/2023   MCH 31.5 01/21/2023   RDW 15.6 (H) 03/27/2023   PLT 207.0 03/27/2023   Last metabolic panel Lab Results  Component Value Date   GLUCOSE 96 04/18/2023   NA 137 04/18/2023   K 4.7 04/18/2023   CL 99 04/18/2023   CO2 32 04/18/2023   BUN 31 (H) 04/18/2023   CREATININE 1.21 04/18/2023   GFR 55.85 (L) 04/18/2023   CALCIUM 9.0 04/18/2023   PHOS 3.8 01/13/2023   PROT 7.7 03/27/2023   ALBUMIN 4.3 03/27/2023   BILITOT 0.6 03/27/2023   ALKPHOS 94 03/27/2023   AST 23 03/27/2023   ALT 6 03/27/2023   ANIONGAP 13 02/03/2023   Last lipids Lab Results  Component Value Date   CHOL 207 (H) 03/27/2023   HDL 64.60 03/27/2023   LDLCALC 124 (H) 03/27/2023   TRIG 91.0 03/27/2023   CHOLHDL 3 03/27/2023   Last hemoglobin A1c Lab Results  Component Value Date   HGBA1C 5.6 01/13/2023   Last thyroid functions Lab Results  Component Value Date   TSH 3.404 01/09/2023   IMPRESSION AND PLAN:  1) PAF, cont apixaban 5 bid and amio 200 every day. BMET and TSH today.  2) CRI III. Avoids NSAIDs. Lytes/cr today.  3) Ischemic CM: doing well on aldactone 12.5mg  every day, farxiga 10mg  every day, and lasix 40mg  bid prn. Most recent echo 02/2023 EF 60-65%. Lytes/cr today  4) L ear cerumen impaction. Consent obtained. Procedure: Cerumen Disimpaction  Warm water was applied and gentle ear lavage performed on left. There  were no complications and following the disimpaction the tympanic membrane was visible on the left. Tympanic membranes are intact following the procedure.  Auditory canals are normal.  The patient reported relief of symptoms after removal of cerumen   5) Facial lesion. Referred to Derm today.  An After Visit Summary was printed and given to the patient.  FOLLOW UP:  31mo RCI Next cpe march/April 2025 Signed:  Santiago Bumpers, MD  07/18/2023  

## 2023-07-19 LAB — BASIC METABOLIC PANEL
BUN/Creatinine Ratio: 25 (calc) — ABNORMAL HIGH (ref 6–22)
BUN: 30 mg/dL — ABNORMAL HIGH (ref 7–25)
CO2: 26 mmol/L (ref 20–32)
Calcium: 9.6 mg/dL (ref 8.6–10.3)
Chloride: 101 mmol/L (ref 98–110)
Creat: 1.2 mg/dL (ref 0.70–1.22)
Glucose, Bld: 89 mg/dL (ref 65–99)
Potassium: 4.8 mmol/L (ref 3.5–5.3)
Sodium: 138 mmol/L (ref 135–146)

## 2023-07-19 LAB — TSH: TSH: 2.88 mIU/L (ref 0.40–4.50)

## 2023-07-25 DIAGNOSIS — G20A1 Parkinson's disease without dyskinesia, without mention of fluctuations: Secondary | ICD-10-CM | POA: Diagnosis not present

## 2023-07-28 ENCOUNTER — Ambulatory Visit: Payer: Medicare Other | Admitting: Podiatry

## 2023-07-28 DIAGNOSIS — M21962 Unspecified acquired deformity of left lower leg: Secondary | ICD-10-CM

## 2023-07-28 DIAGNOSIS — L84 Corns and callosities: Secondary | ICD-10-CM

## 2023-07-28 NOTE — Progress Notes (Unsigned)
Subjective:  Patient ID: Mario George, male    DOB: Dec 05, 1941,  MRN: [id number]  Mario George presents to clinic today for:  Chief Complaint  Patient presents with   Callouses    LEFT FOOT CALLUS - NON DIABETIC   Patient presents for follow-up of a painful callus on the bottom of the left foot.  States that is doing better since last seen.  States that the offloading pad has been helpful  PCP is McGowen, Maryjean Morn, MD.  Past Medical History:  Diagnosis Date   Atrial fibrillation (HCC)    12/2022->converted in hosp on amio.  Eliquis.   BPH with obstruction/lower urinary tract symptoms    orthostatic hypotension on flomax   CAD (coronary artery disease)    Cath 12/2022: chronic LAD occlusion, no stent   Chronic hoarseness    Chronic renal insufficiency, stage 3 (moderate) (HCC)    Eczema    Legs.  Triamcinolone   GERD (gastroesophageal reflux disease)    Ischemic cardiomyopathy    01/10/23: EF 25-30%, severe LV dysfxn, regional WMA   OSA (obstructive sleep apnea)    quit cpap in 2017   Osteoarthritis, multiple sites    Paget's disease of bone    Zoledronic acid 2002 and again in 2012.  Monitoring alkaline phosphatase levels.   Parkinson's disease    Diagnosed 2018   TIA (transient ischemic attack)    12/2022 (R facial droop, dysarthria) when in hosp for a-fib/chf    Past Surgical History:  Procedure Laterality Date   carotid dopplers     12/2022. 1-39% bilat   COLONOSCOPY  2015   2015 normal--no further screening indicated   INGUINAL HERNIA REPAIR Right 07/14/2018   Procedure: OPEN RIGHT INGUINAL HERNIA REPAIR ERAS PATHWAY;  Surgeon: Claud Kelp, MD;  Location: [facility];  Service: General;  Laterality: Right;   INSERTION OF MESH Right 07/14/2018   Procedure: INSERTION OF MESH;  Surgeon: Claud Kelp, MD;  [facility];  Service: General;  Laterality: Right;   KNEE SURGERY Right 2007   arthroscopic   RIGHT/LEFT HEART CATH  AND CORONARY ANGIOGRAPHY N/A 01/13/2023   Chronic LAD occlusion, with collateral flow->med mgmt. Procedure: RIGHT/LEFT HEART CATH AND CORONARY ANGIOGRAPHY;  Surgeon: Kathleene Hazel, MD;  [facility];  Service: Cardiovascular;  Laterality: N/A;   TRANSTHORACIC ECHOCARDIOGRAM     01/10/23: EF 25-30%, severe LV dysfxn, regional WMA   TRANSTHORACIC ECHOCARDIOGRAM     03/24/23 EF 55-60%, normal WM, normal valves, mild aortic dilatation   VASECTOMY  1975    Allergies  Allergen Reactions   Statins Other (See Comments)    Weakness/myalgia    Vicks Nyquil Cough [Doxylamine-Dm]     Felt like skin was crawling   Other Rash    Had rash on arm after pneumonia and flu vaccine together - never had either separately prior to this reaction.  Zquil - "felt like his skin was crawling"    Objective:  There were no vitals filed for this visit.  Mario George is a pleasant 82 y.o. male in NAD. AAO x 3.  Vascular Examination: Capillary refill time is 3-5 seconds to toes bilateral. Palpable pedal pulses b/l LE. Digital hair present b/l. No pedal edema b/l. Skin temperature gradient WNL b/l. No varicosities b/l. No cyanosis or clubbing noted b/l.   Dermatological Examination: Pedal skin with normal turgor, texture and tone b/l. No open wounds. No  interdigital macerations b/l.   Hyperkeratotic lesion present, with pain on palpation, located submet 5 left foot.     Latest Ref Rng & Units 01/13/2023   12:43 AM  Hemoglobin A1C  Hemoglobin-A1c 4.8 - 5.6 % 5.6     Assessment/Plan: 1. Callus of foot   2. Deformity of left foot     The hyperkeratotic lesions were sharply debrided/shaved with sterile #313 blade.  His offloading pad was replaced on the underside of his shoe insole today.   Return if symptoms worsen or fail to improve, for or two months for callus.   Clerance Lav, DPM, FACFAS [facility]     [address] [place], [place] [address]                Office [phone]  Fax [phone]

## 2023-07-29 ENCOUNTER — Telehealth: Payer: Self-pay

## 2023-07-29 ENCOUNTER — Ambulatory Visit (HOSPITAL_BASED_OUTPATIENT_CLINIC_OR_DEPARTMENT_OTHER): Payer: Medicare Other | Admitting: Internal Medicine

## 2023-07-29 ENCOUNTER — Other Ambulatory Visit (HOSPITAL_COMMUNITY): Payer: Self-pay

## 2023-07-29 ENCOUNTER — Encounter (HOSPITAL_BASED_OUTPATIENT_CLINIC_OR_DEPARTMENT_OTHER): Payer: Self-pay | Admitting: Internal Medicine

## 2023-07-29 VITALS — BP 106/65 | HR 59 | Ht 73.0 in | Wt 146.0 lb

## 2023-07-29 DIAGNOSIS — T466X5A Adverse effect of antihyperlipidemic and antiarteriosclerotic drugs, initial encounter: Secondary | ICD-10-CM

## 2023-07-29 DIAGNOSIS — G720 Drug-induced myopathy: Secondary | ICD-10-CM

## 2023-07-29 DIAGNOSIS — I2510 Atherosclerotic heart disease of native coronary artery without angina pectoris: Secondary | ICD-10-CM | POA: Diagnosis not present

## 2023-07-29 DIAGNOSIS — G20A1 Parkinson's disease without dyskinesia, without mention of fluctuations: Secondary | ICD-10-CM | POA: Diagnosis not present

## 2023-07-29 DIAGNOSIS — E785 Hyperlipidemia, unspecified: Secondary | ICD-10-CM | POA: Diagnosis not present

## 2023-07-29 NOTE — Patient Instructions (Signed)
Medication Instructions:  Dr. Rennis Golden recommends Repatha 140mg /mL (PCSK9). This is an injectable cholesterol medication self-administered once every 14 days. This medication will likely need prior approval with your insurance company, which we will work on. If the medication is not approved initially, we may need to do an appeal with your insurance.   Administer medication in area of fatty tissue such as abdomen, outer thigh, back of upper arm - and rotate site with each injection Store medication in refrigerator until ready to administer - allow to sit at room temp for 30 mins - 1 hour prior to injection Dispose of medication in a SHARPS container - your pharmacy should be able to direct you on this and proper disposal   If you need a co-pay card for Repatha: Lawsponsor.fr If you need a co-pay card for Praluent: [email or web address]  Patient Assistance:    These foundations have funds at various times.   The [employer]: https://www.panfoundation.org/disease-funds/hypercholesterolemia/ -- can sign up for wait list  The [facility] offers assistance to help pay for medication copays.  They will cover copays for all cholesterol lowering meds, including statins, fibrates, omega-3 fish oils like Vascepa, ezetimibe, Repatha, Praluent, Nexletol, Nexlizet.  The cards are usually good for $2,500 or 12 months, whichever comes first. Our fax # is [phone] (you will need this to apply) Go to healthwellfoundation.org Click on "Apply Now" Answer questions as to whom is applying (patient or representative) Your disease fund will be "hypercholesterolemia - Medicare access" They will ask questions about finances and which medications you are taking for cholesterol When you submit, the approval is usually within minutes.  You will need to print the card information from the site You will need to show this information to your pharmacy, they will  bill your Medicare Part D plan first -then bill Health Well --for the copay.   You can also call them at [phone], although the hold times can be quite long.     *If you need a refill on your cardiac medications before your next appointment, please call your pharmacy*   Lab Work: FASTING lab work to check cholesterol in 3-4 months  If you have labs (blood work) drawn today and your tests are completely normal, you will receive your results only by: MyChart Message (if you have MyChart) OR A paper copy in the mail If you have any lab test that is abnormal or we need to change your treatment, we will call you to review the results.    Follow-Up: At [facility], you and your health needs are our priority.  As part of our continuing mission to provide you with exceptional heart care, we have created designated Provider Care Teams.  These Care Teams include your primary Cardiologist (physician) and Advanced Practice Providers (APPs -  Physician Assistants and Nurse Practitioners) who all work together to provide you with the care you need, when you need it.  We recommend signing up for the patient portal called "MyChart".  Sign up information is provided on this After Visit Summary.  MyChart is used to connect with patients for Virtual Visits (Telemedicine).  Patients are able to view lab/test results, encounter notes, upcoming appointments, etc.  Non-urgent messages can be sent to your provider as well.   To learn more about what you can do with MyChart, go to [email or web address].    Your next appointment:    3-4 months with Dr. Rennis Golden

## 2023-07-29 NOTE — Telephone Encounter (Signed)
Update sent to patient via MyChart 

## 2023-07-29 NOTE — Telephone Encounter (Signed)
Pharmacy Patient Advocate Encounter  Received notification from [facility] that Prior Authorization for REPATHA has been APPROVED THRU 01/29/24. Ran test claim, Copay is $141 FOR 3 MONTHS

## 2023-07-29 NOTE — Progress Notes (Signed)
LIPID CLINIC CONSULT NOTE  Chief Complaint:  Manage dyslipidemia  Primary Care Physician: Jeoffrey Massed, MD  Primary Cardiologist:  Olga Millers, MD  HPI:  Mario George is a 82 y.o. male who is being seen today for the evaluation of dyslipidemia at the request of McGowen, Maryjean Morn, MD. this a pleasant 82 year old male kindly referred for evaluation management of dyslipidemia.  He reports a longstanding history of statin intolerance having tried at least 3 statins in the past which caused myalgias.  Unfortunately he has Parkinson's disease and has had not only myalgias but some degree of myopathy and/or muscle wasting which was worsened on statins.  He unfortunately also has coronary artery disease.  He underwent cardiac catheterization earlier this year which showed chronic total occlusion of the proximal LAD with right to left collaterals and a large dominant circumflex artery.  Based on this medical therapy was recommended.  His target LDL is less than 70.  His total cholesterol is 207, triglycerides were 91, HDL 64 and LDL 124 as of March 2024.  He has not had LP(a) assessment.  There is heart disease in the family.  We discussed alternatives to statin therapy today including PCSK9 inhibitors.  PMHx:  Past Medical History:  Diagnosis Date   Atrial fibrillation (HCC)    12/2022->converted in hosp on amio.  Eliquis.   BPH with obstruction/lower urinary tract symptoms    orthostatic hypotension on flomax   CAD (coronary artery disease)    Cath 12/2022: chronic LAD occlusion, no stent   Chronic hoarseness    Chronic renal insufficiency, stage 3 (moderate) (HCC)    Eczema    Legs.  Triamcinolone   GERD (gastroesophageal reflux disease)    Ischemic cardiomyopathy    01/10/23: EF 25-30%, severe LV dysfxn, regional WMA   OSA (obstructive sleep apnea)    quit cpap in 2017   Osteoarthritis, multiple sites    Paget's disease of bone    Zoledronic acid 2002 and again in 2012.   Monitoring alkaline phosphatase levels.   Parkinson's disease    Diagnosed 2018   TIA (transient ischemic attack)    12/2022 (R facial droop, dysarthria) when in hosp for a-fib/chf    Past Surgical History:  Procedure Laterality Date   carotid dopplers     12/2022. 1-39% bilat   COLONOSCOPY  2015   2015 normal--no further screening indicated   INGUINAL HERNIA REPAIR Right 07/14/2018   Procedure: OPEN RIGHT INGUINAL HERNIA REPAIR ERAS PATHWAY;  Surgeon: Claud Kelp, MD;  Location: [facility];  Service: General;  Laterality: Right;   INSERTION OF MESH Right 07/14/2018   Procedure: INSERTION OF MESH;  Surgeon: Claud Kelp, MD;  Location: [facility];  Service: General;  Laterality: Right;   KNEE SURGERY Right 2007   arthroscopic   RIGHT/LEFT HEART CATH AND CORONARY ANGIOGRAPHY N/A 01/13/2023   Chronic LAD occlusion, with collateral flow->med mgmt. Procedure: RIGHT/LEFT HEART CATH AND CORONARY ANGIOGRAPHY;  Surgeon: Kathleene Hazel, MD;  [facility];  Service: Cardiovascular;  Laterality: N/A;   TRANSTHORACIC ECHOCARDIOGRAM     01/10/23: EF 25-30%, severe LV dysfxn, regional WMA   TRANSTHORACIC ECHOCARDIOGRAM     03/24/23 EF 55-60%, normal WM, normal valves, mild aortic dilatation   VASECTOMY  1975    FAMHx:  Family History  Problem Relation Age of Onset   Cancer Mother        intestinal   Early death Mother  Alcohol abuse Mother    Liver disease Father        ETOH   Alcohol abuse Father    Arthritis Father    Stroke Sister    Kidney disease Sister    Stroke Sister    Stroke Brother    Arthritis Paternal Grandmother    Early death Child     SOCHx:   reports that he quit smoking about 40 years ago. His smoking use included cigarettes. He has never used smokeless tobacco. He reports current alcohol use. He reports that he does not use drugs.  ALLERGIES:  Allergies  Allergen Reactions   Statins Other (See  Comments)    Weakness/myalgia    Vicks Nyquil Cough [Doxylamine-Dm]     Felt like skin was crawling   Other Rash    Had rash on arm after pneumonia and flu vaccine together - never had either separately prior to this reaction.  Zquil - "felt like his skin was crawling"     ROS: Pertinent items noted in HPI and remainder of comprehensive ROS otherwise negative.  HOME MEDS: Current Outpatient Medications on File Prior to Visit  Medication Sig Dispense Refill   acetaminophen (TYLENOL) 325 MG tablet Take 650 mg by mouth as needed for moderate pain.     amiodarone (PACERONE) 200 MG tablet Take 1 tablet (200 mg total) by mouth daily. 90 tablet 3   apixaban (ELIQUIS) 5 MG TABS tablet Take 1 tablet (5 mg total) by mouth 2 (two) times daily. 180 tablet 3   carbidopa-levodopa (SINEMET CR) 50-200 MG tablet TAKE 1 TABLET BY MOUTH AT  BEDTIME 90 tablet 1   carbidopa-levodopa (SINEMET IR) 25-100 MG tablet Take 2 tablets by mouth 4 (four) times daily. 720 tablet 0   dapagliflozin propanediol (FARXIGA) 10 MG TABS tablet Take 1 tablet (10 mg total) by mouth daily. 90 tablet 3   furosemide (LASIX) 40 MG tablet Take 1 tablet (40 mg total) by mouth daily as needed for edema or fluid. 30 tablet 2   Multiple Vitamin (MULTIVITAMIN ADULT PO) Take 2 tablets by mouth daily.     Psyllium (METAMUCIL PO) Take 10-15 mLs by mouth daily.     spironolactone (ALDACTONE) 25 MG tablet Take 1/2 tablet (12.5 mg total) by mouth daily. 90 tablet 3   No current facility-administered medications on file prior to visit.    LABS/IMAGING: No results found for this or any previous visit (from the past 48 hour(s)). No results found.  LIPID PANEL:    Component Value Date/Time   CHOL 207 (H) 03/27/2023 0911   TRIG 91.0 03/27/2023 0911   HDL 64.60 03/27/2023 0911   CHOLHDL 3 03/27/2023 0911   VLDL 18.2 03/27/2023 0911   LDLCALC 124 (H) 03/27/2023 0911    WEIGHTS: Wt Readings from Last 3 Encounters:  07/29/23 146 lb  (66.2 kg)  07/18/23 144 lb (65.3 kg)  05/29/23 144 lb 9.6 oz (65.6 kg)    VITALS: BP 106/65 (BP Location: Left Arm, Patient Position: Sitting, Cuff Size: Normal)   Pulse (!) 59   Ht 6\' 1"  (1.854 m)   Wt 146 lb (66.2 kg)   SpO2 100%   BMI 19.26 kg/m   EXAM: Deferred  EKG: Deferred  ASSESSMENT: Mixed dyslipidemia, 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   Mr. Bocock has coronary artery disease with a target LDL less than 70.  He cannot tolerate statin therapy  having tried multiple statins before which caused myalgias and myopathy.  He already has a movement disorder with Parkinson's disease and has significant muscle wasting.  He does need a marked improvement in his lipids.  He is unlikely achieve that with diet and lifestyle modifications.  Would recommend PCSK9 inhibitor therapy.  Reach out for prior authorization for this.  Plan repeat lipids including NMR and LP(a) in about 3 to 4 months on therapy.  I will follow-up with him at that time.  Thanks as always for the kind referral.  Chrystie Nose, MD, Milagros Loll  [place]  [facility]  Medical Director of the Advanced Lipid Disorders &  Cardiovascular Risk Reduction Clinic Diplomate of the American Board of Clinical Lipidology Attending Cardiologist  Direct Dial: [phone]  Fax: [phone]  Website:  [email or web address] 07/29/2023, 12:34 PM

## 2023-07-29 NOTE — Telephone Encounter (Signed)
Pharmacy Patient Advocate Encounter   Received notification from Physician's Office that prior authorization for REPATHA is required/requested.   Insurance verification completed.   The patient is insured through [facility] .   Per test claim: PA required; PA submitted to [facility] via CoverMyMeds Key/confirmation #/EOC [id number] Status is pending

## 2023-08-07 ENCOUNTER — Encounter: Payer: Self-pay | Admitting: Family Medicine

## 2023-08-07 NOTE — Telephone Encounter (Signed)
Form printed and placed on PCP desks.

## 2023-09-09 ENCOUNTER — Encounter: Payer: Self-pay | Admitting: Cardiology

## 2023-09-18 ENCOUNTER — Ambulatory Visit (INDEPENDENT_AMBULATORY_CARE_PROVIDER_SITE_OTHER): Payer: Medicare Other | Admitting: Dermatology

## 2023-09-18 ENCOUNTER — Encounter: Payer: Self-pay | Admitting: Dermatology

## 2023-09-18 VITALS — BP 148/86 | HR 70

## 2023-09-18 DIAGNOSIS — L570 Actinic keratosis: Secondary | ICD-10-CM

## 2023-09-18 DIAGNOSIS — D0439 Carcinoma in situ of skin of other parts of face: Secondary | ICD-10-CM | POA: Diagnosis not present

## 2023-09-18 DIAGNOSIS — D485 Neoplasm of uncertain behavior of skin: Secondary | ICD-10-CM

## 2023-09-18 NOTE — Patient Instructions (Addendum)

## 2023-09-18 NOTE — Progress Notes (Signed)
   New Patient Visit   Subjective  Mario George is a 82 y.o. male who presents for the following: spot on left face  Pt has spot on left side of face that has been present around 6 months. It is asymptomatic but pt wants evaluated. Pt has no hx of skin cancer. Unknown if family hx of skin cancer  The patient has spots, moles and lesions to be evaluated, some may be new or changing and the patient may have concern these could be cancer.  Pt is accompanied by his wife.   The following portions of the chart were reviewed this encounter and updated as appropriate: medications, allergies, medical history  Review of Systems:  No other skin or systemic complaints except as noted in HPI or Assessment and Plan.  Objective  Well appearing patient in no apparent distress; mood and affect are within normal limits.  A full examination was performed including scalp, head, eyes, ears, nose, lips, neck, chest, axillae, abdomen, back, buttocks, bilateral upper extremities, bilateral lower extremities, hands, feet, fingers, toes, fingernails, and toenails. All findings within normal limits unless otherwise noted below.   A focused examination was performed of the following areas: Upper body exam  Relevant exam findings are noted in the Assessment and Plan.  Left Zygomatic Area Crusted plaque       Left Parotid Area Crusted plaque    Assessment & Plan     Neoplasm of uncertain behavior of skin (2) Left Zygomatic Area  Skin / nail biopsy Type of biopsy: tangential   Instrument used: DermaBlade   Post-procedure details: wound care instructions given    Specimen 1 - Surgical pathology Differential Diagnosis: R/O NMSC  Check Margins: No  Left Parotid Area  Skin / nail biopsy Type of biopsy: tangential   Instrument used: DermaBlade   Post-procedure details: wound care instructions given    Specimen 2 - Surgical pathology Differential Diagnosis: R/O NMSC  Check Margins:  No    Return in about 3 months (around 12/18/2023).  I, Tillie Fantasia, CMA, am acting as scribe for Gwenith Daily, MD.   Documentation: I have reviewed the above documentation for accuracy and completeness, and I agree with the above.  Gwenith Daily, MD

## 2023-09-22 ENCOUNTER — Other Ambulatory Visit: Payer: Self-pay | Admitting: *Deleted

## 2023-09-22 DIAGNOSIS — I480 Paroxysmal atrial fibrillation: Secondary | ICD-10-CM

## 2023-09-22 DIAGNOSIS — I5020 Unspecified systolic (congestive) heart failure: Secondary | ICD-10-CM

## 2023-09-22 DIAGNOSIS — I2510 Atherosclerotic heart disease of native coronary artery without angina pectoris: Secondary | ICD-10-CM

## 2023-09-22 LAB — SURGICAL PATHOLOGY

## 2023-09-22 MED ORDER — AMIODARONE HCL 200 MG PO TABS
200.0000 mg | ORAL_TABLET | Freq: Every day | ORAL | 3 refills | Status: DC
Start: 2023-09-22 — End: 2024-08-23

## 2023-09-30 NOTE — Progress Notes (Signed)
Hi There,  Forwarding your bx results until GPA and Epic Team sort everything out.  ~Dr. Onalee Hua

## 2023-10-10 DIAGNOSIS — H2513 Age-related nuclear cataract, bilateral: Secondary | ICD-10-CM | POA: Diagnosis not present

## 2023-10-10 DIAGNOSIS — H5213 Myopia, bilateral: Secondary | ICD-10-CM | POA: Diagnosis not present

## 2023-10-10 DIAGNOSIS — H40013 Open angle with borderline findings, low risk, bilateral: Secondary | ICD-10-CM | POA: Diagnosis not present

## 2023-10-16 ENCOUNTER — Encounter: Payer: Self-pay | Admitting: Dermatology

## 2023-10-16 ENCOUNTER — Ambulatory Visit: Payer: Medicare Other | Admitting: Dermatology

## 2023-10-16 VITALS — BP 93/59 | HR 60 | Temp 97.5°F

## 2023-10-16 DIAGNOSIS — L569 Acute skin change due to ultraviolet radiation, unspecified: Secondary | ICD-10-CM | POA: Diagnosis not present

## 2023-10-16 DIAGNOSIS — I872 Venous insufficiency (chronic) (peripheral): Secondary | ICD-10-CM | POA: Diagnosis not present

## 2023-10-16 DIAGNOSIS — D0439 Carcinoma in situ of skin of other parts of face: Secondary | ICD-10-CM | POA: Diagnosis not present

## 2023-10-16 DIAGNOSIS — L814 Other melanin hyperpigmentation: Secondary | ICD-10-CM | POA: Diagnosis not present

## 2023-10-16 DIAGNOSIS — X32XXXA Exposure to sunlight, initial encounter: Secondary | ICD-10-CM | POA: Diagnosis not present

## 2023-10-16 DIAGNOSIS — C4492 Squamous cell carcinoma of skin, unspecified: Secondary | ICD-10-CM

## 2023-10-16 MED ORDER — TRIAMCINOLONE ACETONIDE 0.1 % EX OINT
1.0000 | TOPICAL_OINTMENT | Freq: Every day | CUTANEOUS | 1 refills | Status: AC | PRN
Start: 2023-10-16 — End: ?

## 2023-10-16 MED ORDER — OXYCODONE HCL 5 MG PO TABS: 5.0000 mg | ORAL_TABLET | Freq: Four times a day (QID) | ORAL | 0 refills | Status: DC | PRN

## 2023-10-16 NOTE — Progress Notes (Signed)
Follow-Up Visit   Subjective  Mario George is a 82 y.o. 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  Objective  Well appearing patient in no apparent distress; mood and affect are within normal limits.  A focused examination was performed of the following areas: Left parotid area Relevant physical exam findings are noted in the Assessment and Plan.   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 Yes   Was the anticoagulation regimen changed prior to Mohs? No    Procedure Details: Surgical site (from skin exam): left parotid area Pre-operative length (cm): 1.1 Pre-operative width (cm): 1  Micrographic Surgery Details: Post-operative length (cm): 2.5 Post-operative width (cm): 1.6 Number of Mohs stages: 2  Skin repair Complexity:  Complex Final length (cm):  6.2 Subcutaneous layers (deep stitches):  Suture size:  5-0 Suture type: Monocryl (poliglecaprone 25)   Fine/surface layer approximation (top stitches):  Suture size:  5-0 Suture type: fast-absorbing plain gut    Related Medications oxyCODONE (OXY IR/ROXICODONE) 5 MG immediate release tablet Take 1 tablet (5 mg total) by mouth every 6 (six) hours as needed for up to 8 doses for severe pain (pain score 7-10).  Stasis dermatitis  Related Medications triamcinolone ointment (KENALOG) 0.1 % Apply 1 Application topically daily as needed. To ankles   Return in about 2 weeks (around 10/30/2023) for F/U.  Owens Shark, CMA, am acting as scribe for Gwenith Daily, MD.   10/16/2023  HISTORY OF PRESENT ILLNESS  Mario George is seen in consultation at the request of Dr. Caralyn Guile for biopsy-proven SCCIS. They note that the area has been present for about 1 year increasing in size.  There is no history of previous treatment.  Reports no other new or changing lesions and has no other complaints today.  Medications and allergies: see patient chart.  Review of systems: Reviewed 8 systems and notable for the above skin cancer.  All other systems reviewed are unremarkable/negative, unless noted in the HPI. Past medical history, surgical history, family history, social history were also reviewed and are noted in the chart/questionnaire.    PHYSICAL EXAMINATION  General: Well-appearing, in no acute distress, alert and oriented x 4. Vitals reviewed in chart (if available).   Skin: Exam reveals a 1.1 x 1.0 cm erythematous papule and biopsy scar on the left preauricular cheek. There are rhytids, telangiectasias, and lentigines, consistent with photodamage.  Biopsy report(s) reviewed, confirming the diagnosis.   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 Due to location, size, histology, or recurrence and the likelihood of subclinical extension as well as the need to conserve normal surrounding tissue, the patient was deemed acceptable for Mohs micrographic surgery (MMS).  The nature and purpose of the procedure, associated benefits and risks including recurrence and scarring, possible complications such as pain, infection, and bleeding, and alternative methods of treatment if appropriate were discussed with the patient  during consent. The lesion location was verified by the patient, by reviewing previous notes, pathology reports, and by photographs as well as angulation measurements if available.  Informed consent was reviewed and  signed by the patient, and timeout was performed at 9:30 AM. See op note below.  2. For the photodamage and solar lentigines, sun protection discussed/information given on OTC sunscreens, and we recommend continued regular follow-up with primary dermatologist every 6 months or sooner for any growing, bleeding, or changing lesions. 3. Prognosis and future surveillance discussed. 4. Letter with treatment outcome sent to referring provider. 5. Pain acetaminophen/ibuprofen/oxycodone 5 mg   MOHS MICROGRAPHIC SURGERY AND RECONSTRUCTION  Initial size:   1.0 x 1.1 cm Surgical defect/wound size: 2.5 x 1.6 cm Anesthesia:    0.33% lidocaine with 1:200,000 epinephrine EBL:    <5 mL Complications:  None Repair type:   Complex SQ suture:   5-0 Monocryl Cutaneous suture:  5-0 Fast absorbing gut Final size of the repair: 6.2 cm  Stages: 2  STAGE I: Anesthesia achieved with 0.5% lidocaine with 1:200,000 epinephrine. ChloraPrep applied. 1 section(s) excised using Mohs technique (this includes total peripheral and deep tissue margin excision and evaluation with frozen sections, excised and interpreted by the same physician). The tumor was first debulked and then excised with an approx. 2 mm margin.  Hemostasis was achieved with electrocautery as needed.  The specimen was then oriented, subdivided/relaxed, inked, and processed using Mohs technique.    Frozen section analysis revealed a positive margin for atypical and large mitotic squamous cells in the epidermis in the peripheral margin.    STAGE II: An additional 2 mm margin was excised.  Hemostasis was achieved with electrocautery as needed.  The specimen was then oriented, subdivided/relaxed, inked, and processed using Mohs technique. Evaluation of slides by the Mohs surgeon revealed clear tumor margins.  Reconstruction  The surgical wound was then cleaned, prepped, and re-anesthetized as above. Wound edges were undermined extensively along at least one  entire edge and at a distance equal to or greater than the width of the defect (see wound defect size above) in order to achieve closure and decrease wound tension and anatomic distortion. Redundant tissue repair including standing cone removal was performed. Hemostasis was achieved with electrocautery. Subcutaneous and epidermal tissues were approximated with the above sutures. The surgical site was then lightly scrubbed with sterile, saline-soaked gauze. Steri-strips were applied, and the area was then bandaged using Vaseline ointment, non-adherent gauze, gauze pads, and tape to provide an adequate pressure dressing. The patient tolerated the procedure well, was given detailed written and verbal wound care instructions, and was discharged in good condition.   The patient will follow-up: 2 weeks.   Documentation: I have reviewed the above documentation for accuracy and completeness, and I agree with the above.  Gwenith Daily, MD

## 2023-10-16 NOTE — Patient Instructions (Signed)

## 2023-10-20 ENCOUNTER — Encounter: Payer: Self-pay | Admitting: Family Medicine

## 2023-10-20 ENCOUNTER — Ambulatory Visit (INDEPENDENT_AMBULATORY_CARE_PROVIDER_SITE_OTHER): Payer: Medicare Other | Admitting: Family Medicine

## 2023-10-20 VITALS — BP 94/64 | HR 67 | Wt 142.6 lb

## 2023-10-20 DIAGNOSIS — N1831 Chronic kidney disease, stage 3a: Secondary | ICD-10-CM | POA: Diagnosis not present

## 2023-10-20 DIAGNOSIS — Z79899 Other long term (current) drug therapy: Secondary | ICD-10-CM

## 2023-10-20 NOTE — Progress Notes (Signed)
OFFICE VISIT  10/20/2023  CC:  Chief Complaint  Patient presents with   Medical Management of Chronic Issues    Patient is a 82 y.o. male who presents for PAF, chronic renal insufficiency stage III, and ischemic cardiomyopathy. A/P as of last visit: "1) PAF, cont apixaban 5 bid and amio 200 every day. BMET and TSH today.   2) CRI III. Avoids NSAIDs. Lytes/cr today.   3) Ischemic CM: doing well on aldactone 12.5mg  every day, farxiga 10mg  every day, and lasix 40mg  bid prn. Most recent echo 02/2023 EF 60-65%. Lytes/cr today"  INTERIM HX: Mario George feels well. No dyspnea on exertion. No lower extremity swelling.  He has not had to use any Lasix recently at all.  He saw Dr. Rennis Golden on 07/29/2023 for consultation regarding hyperlipidemia and history of statin intolerance. PCSK9 inhibitor recommended.  Mario George decided not to take the medication due to cost.  Past Medical History:  Diagnosis Date   Atrial fibrillation (HCC)    12/2022->converted in hosp on amio.  Eliquis.   BPH with obstruction/lower urinary tract symptoms    orthostatic hypotension on flomax   CAD (coronary artery disease)    Cath 12/2022: chronic LAD occlusion, no stent   Chronic hoarseness    Chronic renal insufficiency, stage 3 (moderate) (HCC)    Eczema    Legs.  Triamcinolone   GERD (gastroesophageal reflux disease)    Ischemic cardiomyopathy    01/10/23: EF 25-30%, severe LV dysfxn, regional WMA   Mixed dyslipidemia    OSA (obstructive sleep apnea)    quit cpap in 2017   Osteoarthritis, multiple sites    Paget's disease of bone    Zoledronic acid 2002 and again in 2012.  Monitoring alkaline phosphatase levels.   Parkinson's disease (HCC)    Diagnosed 2018   Statin intolerance    TIA (transient ischemic attack)    12/2022 (R facial droop, dysarthria) when in hosp for a-fib/chf    Past Surgical History:  Procedure Laterality Date   carotid dopplers     12/2022. 1-39% bilat   COLONOSCOPY  2015   2015  normal--no further screening indicated   INGUINAL HERNIA REPAIR Right 07/14/2018   Procedure: OPEN RIGHT INGUINAL HERNIA REPAIR ERAS PATHWAY;  Surgeon: Claud Kelp, MD;  Location: [facility];  Service: General;  Laterality: Right;   INSERTION OF MESH Right 07/14/2018   Procedure: INSERTION OF MESH;  Surgeon: Claud Kelp, MD;  Location: [facility];  Service: General;  Laterality: Right;   KNEE SURGERY Right 2007   arthroscopic   RIGHT/LEFT HEART CATH AND CORONARY ANGIOGRAPHY N/A 01/13/2023   Chronic LAD occlusion, with collateral flow->med mgmt. Procedure: RIGHT/LEFT HEART CATH AND CORONARY ANGIOGRAPHY;  Surgeon: Kathleene Hazel, MD;  Location: [facility];  Service: Cardiovascular;  Laterality: N/A;   TRANSTHORACIC ECHOCARDIOGRAM     01/10/23: EF 25-30%, severe LV dysfxn, regional WMA   TRANSTHORACIC ECHOCARDIOGRAM     03/24/23 EF 55-60%, normal WM, normal valves, mild aortic dilatation   VASECTOMY  1975    Outpatient Medications Prior to Visit  Medication Sig Dispense Refill   acetaminophen (TYLENOL) 325 MG tablet Take 650 mg by mouth as needed for moderate pain.     amiodarone (PACERONE) 200 MG tablet Take 1 tablet (200 mg total) by mouth daily. 90 tablet 3   apixaban (ELIQUIS) 5 MG TABS tablet Take 1 tablet (5 mg total) by mouth 2 (two) times daily. 180 tablet 3   carbidopa-levodopa (  SINEMET CR) 50-200 MG tablet TAKE 1 TABLET BY MOUTH AT  BEDTIME 90 tablet 1   carbidopa-levodopa (SINEMET IR) 25-100 MG tablet Take 2 tablets by mouth 4 (four) times daily. 720 tablet 0   dapagliflozin propanediol (FARXIGA) 10 MG TABS tablet Take 1 tablet (10 mg total) by mouth daily. 90 tablet 3   Multiple Vitamin (MULTIVITAMIN ADULT PO) Take 2 tablets by mouth daily.     oxyCODONE (OXY IR/ROXICODONE) 5 MG immediate release tablet Take 1 tablet (5 mg total) by mouth every 6 (six) hours as needed for up to 8 doses for severe pain (pain score 7-10). 8 tablet 0    Psyllium (METAMUCIL PO) Take 10-15 mLs by mouth daily.     spironolactone (ALDACTONE) 25 MG tablet Take 1/2 tablet (12.5 mg total) by mouth daily. 90 tablet 3   triamcinolone ointment (KENALOG) 0.1 % Apply 1 Application topically daily as needed. To ankles 450 g 1   furosemide (LASIX) 40 MG tablet Take 1 tablet (40 mg total) by mouth daily as needed for edema or fluid. 30 tablet 2   No facility-administered medications prior to visit.    Allergies  Allergen Reactions   Statins Other (See Comments)    Weakness/myalgia    Vicks Nyquil Cough [Doxylamine-Dm]     Felt like skin was crawling   Other Rash    Had rash on arm after pneumonia and flu vaccine together - never had either separately prior to this reaction.  Zquil - "felt like his skin was crawling"     Review of Systems As per HPI  PE:    10/20/2023    1:06 PM 10/16/2023    9:01 AM 09/18/2023    2:58 PM  Vitals with BMI  Weight 142 lbs 10 oz    Systolic 94 93 148  Diastolic 64 59 86  Pulse 67 60 70    Physical Exam  Gen: Alert, well appearing.  Patient is oriented to person, place, time, and situation. AFFECT: pleasant, lucid thought and speech. CV: RRR, no m/r/g.   LUNGS: CTA bilat, nonlabored resps, good aeration in all lung fields. EXT: no clubbing or cyanosis.  no edema.    LABS:  Last CBC Lab Results  Component Value Date   WBC 5.8 03/27/2023   HGB 14.8 03/27/2023   HCT 44.8 03/27/2023   MCV 93.9 03/27/2023   MCH 31.5 01/21/2023   RDW 15.6 (H) 03/27/2023   PLT 207.0 03/27/2023   Last metabolic panel Lab Results  Component Value Date   GLUCOSE 89 07/18/2023   NA 138 07/18/2023   K 4.8 07/18/2023   CL 101 07/18/2023   CO2 26 07/18/2023   BUN 30 (H) 07/18/2023   CREATININE 1.20 07/18/2023   GFR 55.85 (L) 04/18/2023   CALCIUM 9.6 07/18/2023   PHOS 3.8 01/13/2023   PROT 7.7 03/27/2023   ALBUMIN 4.3 03/27/2023   BILITOT 0.6 03/27/2023   ALKPHOS 94 03/27/2023   AST 23 03/27/2023   ALT 6  03/27/2023   ANIONGAP 13 02/03/2023   Last lipids Lab Results  Component Value Date   CHOL 207 (H) 03/27/2023   HDL 64.60 03/27/2023   LDLCALC 124 (H) 03/27/2023   TRIG 91.0 03/27/2023   CHOLHDL 3 03/27/2023   Last hemoglobin A1c Lab Results  Component Value Date   HGBA1C 5.6 01/13/2023   Last thyroid functions Lab Results  Component Value Date   TSH 2.88 07/18/2023   IMPRESSION AND PLAN:   1) PAF,  cont apixaban 5 bid and amio 200 every day. BMET and TSH today.   2) CRI III. Avoids NSAIDs. Lytes/cr today.   3) Ischemic CM: doing well on aldactone 12.5mg  every day, farxiga 10mg  every day, and lasix 40mg  bid prn. Most recent echo 02/2023 EF 60-65%. Lytes/cr today. CAD-->chronically occluded LAD.  #4 mixed dyslipidemia, goal LDL less than 70.  He has history of statin intolerance ->myalgia/myopathy. He has established with advanced lipid clinic/Dr. Hilty and PCSK9 inhibitor was recommended but Mario George decided against medication trial.  An After Visit Summary was printed and given to the patient.  FOLLOW UP: Return in about 6 months (around 04/19/2024) for routine chronic illness f/u. Next CPE March/April 2025 Signed:  Santiago Bumpers, MD           10/20/2023

## 2023-10-21 LAB — BASIC METABOLIC PANEL
BUN: 34 mg/dL — ABNORMAL HIGH (ref 6–23)
CO2: 28 meq/L (ref 19–32)
Calcium: 9.1 mg/dL (ref 8.4–10.5)
Chloride: 101 meq/L (ref 96–112)
Creatinine, Ser: 1.26 mg/dL (ref 0.40–1.50)
GFR: 53.01 mL/min — ABNORMAL LOW (ref >60.00)
Glucose, Bld: 104 mg/dL — ABNORMAL HIGH (ref 70–99)
Potassium: 4.8 meq/L (ref 3.5–5.1)
Sodium: 139 meq/L (ref 135–145)

## 2023-10-21 LAB — TSH: TSH: 3.66 u[IU]/mL (ref 0.35–5.50)

## 2023-10-30 ENCOUNTER — Ambulatory Visit: Payer: Medicare Other | Admitting: Dermatology

## 2023-11-04 ENCOUNTER — Encounter: Payer: Self-pay | Admitting: Dermatology

## 2023-11-04 ENCOUNTER — Ambulatory Visit: Payer: Medicare Other | Admitting: Dermatology

## 2023-11-04 DIAGNOSIS — L905 Scar conditions and fibrosis of skin: Secondary | ICD-10-CM | POA: Diagnosis not present

## 2023-11-04 DIAGNOSIS — Z86007 Personal history of in-situ neoplasm of skin: Secondary | ICD-10-CM

## 2023-11-04 DIAGNOSIS — C4492 Squamous cell carcinoma of skin, unspecified: Secondary | ICD-10-CM

## 2023-11-04 NOTE — Progress Notes (Signed)
   Follow-Up Visit   Subjective  Mario George is a 82 y.o. male who presents for the following: Post op - SCC of left parotid area s/p Mohs on 10/16/23   The following portions of the chart were reviewed this encounter and updated as appropriate: medications, allergies, medical history  Review of Systems:  No other skin or systemic complaints except as noted in HPI or Assessment and Plan.  Objective  Well appearing patient in no apparent distress; mood and affect are within normal limits.  A focused examination was performed of the following areas: Face  Relevant exam findings are noted in the Assessment and Plan.   Assessment & Plan   HISTORY OF SQUAMOUS CELL CARCINOMA OF THE SKIN of left parotid area    - Mohs site is healing well. - No evidence of recurrence today - Recommend regular full body skin exams - Recommend daily broad spectrum sunscreen SPF 30+ to sun-exposed areas, reapply every 2 hours as needed.  - Call if any new or changing lesions are noted between office visits - OK for scar massage  Return in about 2 months (around 01/04/2024) for Follow up.  I, Joanie Coddington, CMA, am acting as scribe for Gwenith Daily, MD .   Documentation: I have reviewed the above documentation for accuracy and completeness, and I agree with the above.  Gwenith Daily, MD

## 2023-11-04 NOTE — Patient Instructions (Signed)

## 2023-11-12 ENCOUNTER — Ambulatory Visit: Payer: Medicare Other | Admitting: *Deleted

## 2023-11-12 DIAGNOSIS — Z0000 Encounter for general adult medical examination without abnormal findings: Secondary | ICD-10-CM | POA: Diagnosis not present

## 2023-11-12 NOTE — Progress Notes (Signed)
Subjective:   Mario George is a 82 y.o. male who presents for Medicare Annual/Subsequent preventive examination.  Visit Complete: Virtual I connected with  Mario George on 11/12/23 by a audio enabled telemedicine application and verified that I am speaking with the correct person using two identifiers.  Patient Location: Home  Provider Location: Home Office  I discussed the limitations of evaluation and management by telemedicine. The patient expressed understanding and agreed to proceed.  Vital Signs: Because this visit was a virtual/telehealth visit, some criteria may be missing or patient reported. Any vitals not documented were not able to be obtained and vitals that have been documented are patient reported.   Cardiac Risk Factors include: advanced age (>80men, >46 women);male gender;family history of premature cardiovascular disease     Objective:    There were no vitals filed for this visit. There is no height or weight on file to calculate BMI.     11/12/2023   10:33 AM 11/12/2023   10:32 AM 01/09/2023    3:49 PM 11/06/2022   10:08 AM 07/14/2018    9:07 AM 07/08/2018   11:56 AM  Advanced Directives  Does Patient Have a Medical Advance Directive? No Yes Yes Yes Yes Yes  Type of Diplomatic Services operational officer  Living will;Healthcare Power of [place];Out of facility DNR (pink MOST or yellow form) Healthcare Power of [place];Living will Living will Living will  Does patient want to make changes to medical advance directive?      No - Patient declined  Copy of Healthcare Power of Attorney in Chart? No - copy requested  No - copy requested, Physician notified No - copy requested No - copy requested     Current Medications (verified) Outpatient Encounter Medications as of 11/12/2023  Medication Sig   acetaminophen (TYLENOL) 325 MG tablet Take 650 mg by mouth as needed for moderate pain.   amiodarone (PACERONE) 200 MG tablet Take 1 tablet (200 mg total) by  mouth daily.   apixaban (ELIQUIS) 5 MG TABS tablet Take 1 tablet (5 mg total) by mouth 2 (two) times daily.   carbidopa-levodopa (SINEMET CR) 50-200 MG tablet TAKE 1 TABLET BY MOUTH AT  BEDTIME   carbidopa-levodopa (SINEMET IR) 25-100 MG tablet Take 2 tablets by mouth 4 (four) times daily.   dapagliflozin propanediol (FARXIGA) 10 MG TABS tablet Take 1 tablet (10 mg total) by mouth daily.   Multiple Vitamin (MULTIVITAMIN ADULT PO) Take 2 tablets by mouth daily.   Psyllium (METAMUCIL PO) Take 10-15 mLs by mouth daily.   spironolactone (ALDACTONE) 25 MG tablet Take 1/2 tablet (12.5 mg total) by mouth daily.   triamcinolone ointment (KENALOG) 0.1 % Apply 1 Application topically daily as needed. To ankles   furosemide (LASIX) 40 MG tablet Take 1 tablet (40 mg total) by mouth daily as needed for edema or fluid.   oxyCODONE (OXY IR/ROXICODONE) 5 MG immediate release tablet Take 1 tablet (5 mg total) by mouth every 6 (six) hours as needed for up to 8 doses for severe pain (pain score 7-10). (Patient not taking: Reported on 11/12/2023)   No facility-administered encounter medications on file as of 11/12/2023.    Allergies (verified) Statins, Vicks nyquil cough [doxylamine-dm], and Other   History: Past Medical History:  Diagnosis Date   Atrial fibrillation (HCC)    12/2022->converted in hosp on amio.  Eliquis.   BPH with obstruction/lower urinary tract symptoms    orthostatic hypotension on flomax   CAD (coronary artery  disease)    Cath 12/2022: chronic LAD occlusion, no stent   Chronic hoarseness    Chronic renal insufficiency, stage 3 (moderate) (HCC)    Eczema    Legs.  Triamcinolone   GERD (gastroesophageal reflux disease)    Ischemic cardiomyopathy    01/10/23: EF 25-30%, severe LV dysfxn, regional WMA   Mixed dyslipidemia    OSA (obstructive sleep apnea)    quit cpap in 2017   Osteoarthritis, multiple sites    Paget's disease of bone    Zoledronic acid 2002 and again in 2012.   Monitoring alkaline phosphatase levels.   Parkinson's disease (HCC)    Diagnosed 2018   Statin intolerance    TIA (transient ischemic attack)    12/2022 (R facial droop, dysarthria) when in hosp for a-fib/chf   Past Surgical History:  Procedure Laterality Date   carotid dopplers     12/2022. 1-39% bilat   COLONOSCOPY  2015   2015 normal--no further screening indicated   INGUINAL HERNIA REPAIR Right 07/14/2018   Procedure: OPEN RIGHT INGUINAL HERNIA REPAIR ERAS PATHWAY;  Surgeon: Claud Kelp, MD;  Location: [facility];  Service: General;  Laterality: Right;   INSERTION OF MESH Right 07/14/2018   Procedure: INSERTION OF MESH;  Surgeon: Claud Kelp, MD;  Location: [facility];  Service: General;  Laterality: Right;   KNEE SURGERY Right 2007   arthroscopic   RIGHT/LEFT HEART CATH AND CORONARY ANGIOGRAPHY N/A 01/13/2023   Chronic LAD occlusion, with collateral flow->med mgmt. Procedure: RIGHT/LEFT HEART CATH AND CORONARY ANGIOGRAPHY;  Surgeon: Kathleene Hazel, MD;  Location: [facility];  Service: Cardiovascular;  Laterality: N/A;   TRANSTHORACIC ECHOCARDIOGRAM     01/10/23: EF 25-30%, severe LV dysfxn, regional WMA   TRANSTHORACIC ECHOCARDIOGRAM     03/24/23 EF 55-60%, normal WM, normal valves, mild aortic dilatation   VASECTOMY  1975   Family History  Problem Relation Age of Onset   Cancer Mother        intestinal   Early death Mother    Alcohol abuse Mother    Liver disease Father        ETOH   Alcohol abuse Father    Arthritis Father    Stroke Sister    Kidney disease Sister    Stroke Sister    Stroke Brother    Arthritis Paternal Grandmother    Early death Child    Social History   Socioeconomic History   Marital status: Married    Spouse name: Mario George   Number of children: 3   Years of education: Not on file   Highest education level: Bachelor's degree (e.g., BA, AB, BS)  Occupational History   Occupation: retired     Comment: health care admin/hospital  Tobacco Use   Smoking status: Former    Current packs/day: 0.00    Types: Cigarettes    Quit date: 12/30/1982    Years since quitting: 40.8   Smokeless tobacco: Never  Vaping Use   Vaping status: Never Used  Substance and Sexual Activity   Alcohol use: Yes    Comment: one drink (wine or beer) daily   Drug use: No   Sexual activity: Not Currently  Other Topics Concern   Not on file  Social History Narrative   Married, several children, also grandchildren   Originally from [place].     Moved to [facility] April 2023.   Educ: College   Occup: retired Presenter, broadcasting   No  T/A/Ds   Social Determinants of Health   Financial Resource Strain: Low Risk  (11/12/2023)   Overall Financial Resource Strain (CARDIA)    Difficulty of Paying Living Expenses: Not very hard  Food Insecurity: No Food Insecurity (11/12/2023)   Hunger Vital Sign    Worried About Running Out of Food in the Last Year: Never true    Ran Out of Food in the Last Year: Never true  Transportation Needs: No Transportation Needs (11/12/2023)   PRAPARE - Administrator, Civil Service (Medical): No    Lack of Transportation (Non-Medical): No  Physical Activity: Sufficiently Active (11/12/2023)   Exercise Vital Sign    Days of Exercise per Week: 5 days    Minutes of Exercise per Session: 40 min  Stress: No Stress Concern Present (11/12/2023)   [employer] of Occupational Health - Occupational Stress Questionnaire    Feeling of Stress : Not at all  Social Connections: Moderately Isolated (11/12/2023)   Social Connection and Isolation Panel [NHANES]    Frequency of Communication with Friends and Family: More than three times a week    Frequency of Social Gatherings with Friends and Family: More than three times a week    Attends Religious Services: Never    Database administrator or Organizations: No    Attends Engineer, structural: Never    Marital  Status: Married    Tobacco Counseling Counseling given: Not Answered   Clinical Intake:  Pre-visit preparation completed: Yes  Pain : No/denies pain     Diabetes: No  How often do you need to have someone help you when you read instructions, pamphlets, or other written materials from your doctor or pharmacy?: 1 - Never  Interpreter Needed?: No  Information entered by :: Remi Haggard LPN   Activities of Daily Living    11/12/2023   10:36 AM  In your present state of health, do you have any difficulty performing the following activities:  Hearing? 1  Vision? 0  Difficulty concentrating or making decisions? 0  Walking or climbing stairs? 1  Dressing or bathing? 0  Doing errands, shopping? 1  Preparing Food and eating ? N  Using the Toilet? N  In the past six months, have you accidently leaked urine? Y  Do you have problems with loss of bowel control? N  Managing your Medications? N  Managing your Finances? N  Housekeeping or managing your Housekeeping? N    Patient Care Team: Jeoffrey Massed, MD as PCP - General (Family Medicine) Jens Som Madolyn Frieze, MD as PCP - Cardiology (Cardiology) Jaquita Folds, MD as Referring Physician (Neurology) Tat, Octaviano Batty, DO as Consulting Physician (Neurology) Spainhour, Kermit Balo, PA as Consulting Physician (Otolaryngology) Duke, Roe Rutherford, PA as Physician Assistant (Cardiology)  Indicate any recent Medical Services you may have received from other than [facility] providers in the past year (date may be approximate).     Assessment:   This is a routine wellness examination for Lewi.  Hearing/Vision screen Hearing Screening - Comments:: Has trouble hearing No hearing aids Vision Screening - Comments:: Dr. Cathey Endow Up to date   Goals Addressed             This Visit's Progress    Increase physical activity         Depression Screen    11/12/2023   10:39 AM 04/18/2023    1:32 PM 11/06/2022   10:06 AM 06/12/2022     9:01 AM  PHQ  2/9 Scores  PHQ - 2 Score 0 0 0 0  PHQ- 9 Score 2 0      Fall Risk    11/12/2023   10:33 AM 11/12/2023   10:32 AM 04/18/2023    1:31 PM 11/06/2022   10:09 AM 06/12/2022    8:58 AM  Fall Risk   Falls in the past year? 0 0 0 0 1  Number falls in past yr: 0 0  0 0  Injury with Fall? 0 0  0 0  Risk for fall due to :   Impaired balance/gait Impaired balance/gait;Impaired mobility Impaired balance/gait;Impaired vision  Follow up Falls evaluation completed;Education provided;Falls prevention discussed Falls evaluation completed;Education provided;Falls prevention discussed Falls evaluation completed Falls prevention discussed Falls evaluation completed    MEDICARE RISK AT HOME: Medicare Risk at Home Any stairs in or around the home?: No If so, are there any without handrails?: No Home free of loose throw rugs in walkways, pet beds, electrical cords, etc?: Yes Adequate lighting in your home to reduce risk of falls?: Yes Life alert?: No Use of a cane, walker or w/c?: Yes Grab bars in the bathroom?: Yes Shower chair or bench in shower?: Yes Elevated toilet seat or a handicapped toilet?: Yes  TIMED UP AND GO:  Was the test performed?  No    Cognitive Function:        11/12/2023   10:37 AM 11/06/2022   10:10 AM  6CIT Screen  What Year? 0 points 0 points  What month? 0 points 0 points  What time? 0 points 0 points  Count back from 20 0 points 0 points  Months in reverse 0 points 0 points  Repeat phrase 0 points 0 points  Total Score 0 points 0 points    Immunizations Immunization History  Administered Date(s) Administered   Fluad Quad(high Dose 65+) 09/26/2022   Influenza, High Dose Seasonal PF 10/14/2023   PFIZER(Purple Top)SARS-COV-2 Vaccination 02/09/2020, 03/02/2020, 10/13/2020, 04/08/2021, 10/14/2023   PNEUMOCOCCAL CONJUGATE-20 03/27/2023   Pfizer Covid-19 Vaccine Bivalent Booster 55yrs & up 10/14/2021   Pneumococcal-Unspecified 12/31/2019   Tdap  12/30/2008   Zoster Recombinant(Shingrix) 08/29/2022, 11/08/2022    TDAP status: Due, Education has been provided regarding the importance of this vaccine. Advised may receive this vaccine at local pharmacy or Health Dept. Aware to provide a copy of the vaccination record if obtained from local pharmacy or Health Dept. Verbalized acceptance and understanding.  Flu Vaccine status: Up to date  Pneumococcal vaccine status: Up to date  Covid-19 vaccine status: Declined, Education has been provided regarding the importance of this vaccine but patient still declined. Advised may receive this vaccine at local pharmacy or Health Dept.or vaccine clinic. Aware to provide a copy of the vaccination record if obtained from local pharmacy or Health Dept. Verbalized acceptance and understanding.  Qualifies for Shingles Vaccine? No   Zostavax completed Yes   Shingrix Completed?: Yes  Screening Tests Health Maintenance  Topic Date Due   DTaP/Tdap/Td (2 - Td or Tdap) 11/11/2024 (Originally 12/30/2018)   COVID-19 Vaccine (7 - 2023-24 season) 12/09/2023   Medicare Annual Wellness (AWV)  11/11/2024   Pneumonia Vaccine 29+ Years old  Completed   INFLUENZA VACCINE  Completed   Zoster Vaccines- Shingrix  Completed   HPV VACCINES  Aged Out    Health Maintenance  There are no preventive care reminders to display for this patient.   Colorectal cancer screening: No longer required.   Lung Cancer Screening: (Low Dose CT Chest  recommended if Age 28-80 years, 20 pack-year currently smoking OR have quit w/in 15years.) does not qualify.   Lung Cancer Screening Referral:   Additional Screening:  Hepatitis C Screening  Never done  Vision Screening: Recommended annual ophthalmology exams for early detection of glaucoma and other disorders of the eye. Is the patient up to date with their annual eye exam?  Yes  Who is the provider or what is the name of the office in which the patient attends annual eye exams?  [facility] If pt is not established with a provider, would they like to be referred to a provider to establish care? No .   Dental Screening: Recommended annual dental exams for proper oral hygiene    Community Resource Referral / Chronic Care Management: CRR required this visit?  No   CCM required this visit?  No     Plan:     I have personally reviewed and noted the following in the patient's chart:   Medical and social history Use of alcohol, tobacco or illicit drugs  Current medications and supplements including opioid prescriptions. Patient is not currently taking opioid prescriptions. Functional ability and status Nutritional status Physical activity Advanced directives List of other physicians Hospitalizations, surgeries, and ER visits in previous 12 months Vitals Screenings to include cognitive, depression, and falls Referrals and appointments  In addition, I have reviewed and discussed with patient certain preventive protocols, quality metrics, and best practice recommendations. A written personalized care plan for preventive services as well as general preventive health recommendations were provided to patient.     Remi Haggard, LPN   [id number]/09/[id number]   After Visit Summary: (MyChart) Due to this being a telephonic visit, the after visit summary with patients personalized plan was offered to patient via MyChart   Nurse Notes:

## 2023-11-12 NOTE — Patient Instructions (Signed)
Mario George , Thank you for taking time to come for your Medicare Wellness Visit. I appreciate your ongoing commitment to your health goals. Please review the following plan we discussed and let me know if I can assist you in the future.   Screening recommendations/referrals: Colonoscopy:  Recommended yearly ophthalmology/optometry visit for glaucoma screening and checkup Recommended yearly dental visit for hygiene and checkup  Vaccinations: Influenza vaccine: up to date Pneumococcal vaccine: up to date Tdap vaccine:  Education provided Shingles vaccine: up tod ate    Advanced directives: Education provided    Preventive Care 82 Years and Older, Male Preventive care refers to lifestyle choices and visits with your health care provider that can promote health and wellness. What does preventive care include? A yearly physical exam. This is also called an annual well check. Dental exams once or twice a year. Routine eye exams. Ask your health care provider how often you should have your eyes checked. Personal lifestyle choices, including: Daily care of your teeth and gums. Regular physical activity. Eating a healthy diet. Avoiding tobacco and drug use. Limiting alcohol use. Practicing safe sex. Taking low doses of aspirin every day. Taking vitamin and mineral supplements as recommended by your health care provider. What happens during an annual well check? The services and screenings done by your health care provider during your annual well check will depend on your age, overall health, lifestyle risk factors, and family history of disease. Counseling  Your health care provider may ask you questions about your: Alcohol use. Tobacco use. Drug use. Emotional well-being. Home and relationship well-being. Sexual activity. Eating habits. History of falls. Memory and ability to understand (cognition). Work and work Astronomer. Screening  You may have the following tests or  measurements: Height, weight, and BMI. Blood pressure. Lipid and cholesterol levels. These may be checked every 5 years, or more frequently if you are over 46 years old. Skin check. Lung cancer screening. You may have this screening every year starting at age 30 if you have a 30-pack-year history of smoking and currently smoke or have quit within the past 15 years. Fecal occult blood test (FOBT) of the stool. You may have this test every year starting at age 59. Flexible sigmoidoscopy or colonoscopy. You may have a sigmoidoscopy every 5 years or a colonoscopy every 10 years starting at age 73. Prostate cancer screening. Recommendations will vary depending on your family history and other risks. Hepatitis C blood test. Hepatitis B blood test. Sexually transmitted disease (STD) testing. Diabetes screening. This is done by checking your blood sugar (glucose) after you have not eaten for a while (fasting). You may have this done every 1-3 years. Abdominal aortic aneurysm (AAA) screening. You may need this if you are a current or former smoker. Osteoporosis. You may be screened starting at age 48 if you are at high risk. Talk with your health care provider about your test results, treatment options, and if necessary, the need for more tests. Vaccines  Your health care provider may recommend certain vaccines, such as: Influenza vaccine. This is recommended every year. Tetanus, diphtheria, and acellular pertussis (Tdap, Td) vaccine. You may need a Td booster every 10 years. Zoster vaccine. You may need this after age 72. Pneumococcal 13-valent conjugate (PCV13) vaccine. One dose is recommended after age 52. Pneumococcal polysaccharide (PPSV23) vaccine. One dose is recommended after age 62. Talk to your health care provider about which screenings and vaccines you need and how often you need them. This information  is not intended to replace advice given to you by your health care provider. Make sure  you discuss any questions you have with your health care provider. Document Released: 01/12/2016 Document Revised: 09/04/2016 Document Reviewed: 10/17/2015 Elsevier Interactive Patient Education  2017 [employer].  Fall Prevention in the Home Falls can cause injuries. They can happen to people of all ages. There are many things you can do to make your home safe and to help prevent falls. What can I do on the outside of my home? Regularly fix the edges of walkways and driveways and fix any cracks. Remove anything that might make you trip as you walk through a door, such as a raised step or threshold. Trim any bushes or trees on the path to your home. Use bright outdoor lighting. Clear any walking paths of anything that might make someone trip, such as rocks or tools. Regularly check to see if handrails are loose or broken. Make sure that both sides of any steps have handrails. Any raised decks and porches should have guardrails on the edges. Have any leaves, snow, or ice cleared regularly. Use sand or salt on walking paths during winter. Clean up any spills in your garage right away. This includes oil or grease spills. What can I do in the bathroom? Use night lights. Install grab bars by the toilet and in the tub and shower. Do not use towel bars as grab bars. Use non-skid mats or decals in the tub or shower. If you need to sit down in the shower, use a plastic, non-slip stool. Keep the floor dry. Clean up any water that spills on the floor as soon as it happens. Remove soap buildup in the tub or shower regularly. Attach bath mats securely with double-sided non-slip rug tape. Do not have throw rugs and other things on the floor that can make you trip. What can I do in the bedroom? Use night lights. Make sure that you have a light by your bed that is easy to reach. Do not use any sheets or blankets that are too big for your bed. They should not hang down onto the floor. Have a firm  chair that has side arms. You can use this for support while you get dressed. Do not have throw rugs and other things on the floor that can make you trip. What can I do in the kitchen? Clean up any spills right away. Avoid walking on wet floors. Keep items that you use a lot in easy-to-reach places. If you need to reach something above you, use a strong step stool that has a grab bar. Keep electrical cords out of the way. Do not use floor polish or wax that makes floors slippery. If you must use wax, use non-skid floor wax. Do not have throw rugs and other things on the floor that can make you trip. What can I do with my stairs? Do not leave any items on the stairs. Make sure that there are handrails on both sides of the stairs and use them. Fix handrails that are broken or loose. Make sure that handrails are as long as the stairways. Check any carpeting to make sure that it is firmly attached to the stairs. Fix any carpet that is loose or worn. Avoid having throw rugs at the top or bottom of the stairs. If you do have throw rugs, attach them to the floor with carpet tape. Make sure that you have a light switch at the top of  the stairs and the bottom of the stairs. If you do not have them, ask someone to add them for you. What else can I do to help prevent falls? Wear shoes that: Do not have high heels. Have rubber bottoms. Are comfortable and fit you well. Are closed at the toe. Do not wear sandals. If you use a stepladder: Make sure that it is fully opened. Do not climb a closed stepladder. Make sure that both sides of the stepladder are locked into place. Ask someone to hold it for you, if possible. Clearly mark and make sure that you can see: Any grab bars or handrails. First and last steps. Where the edge of each step is. Use tools that help you move around (mobility aids) if they are needed. These include: Canes. Walkers. Scooters. Crutches. Turn on the lights when you go  into a dark area. Replace any light bulbs as soon as they burn out. Set up your furniture so you have a clear path. Avoid moving your furniture around. If any of your floors are uneven, fix them. If there are any pets around you, be aware of where they are. Review your medicines with your doctor. Some medicines can make you feel dizzy. This can increase your chance of falling. Ask your doctor what other things that you can do to help prevent falls. This information is not intended to replace advice given to you by your health care provider. Make sure you discuss any questions you have with your health care provider. Document Released: 10/12/2009 Document Revised: 05/23/2016 Document Reviewed: 01/20/2015 Elsevier Interactive Patient Education  2017 [employer].

## 2023-11-18 ENCOUNTER — Ambulatory Visit: Payer: Medicare Other | Admitting: Dermatology

## 2023-12-02 NOTE — Progress Notes (Signed)
HPI: Follow-up atrial fibrillation and congestive heart failure.  Patient diagnosed January 2024 with newly diagnosed atrial fibrillation.  Echocardiogram showed ejection fraction 25 to 30%, mild right atrial enlargement, mild mitral regurgitation.  Cardiac catheterization revealed 99% proximal LAD followed by occluded mid LAD.  The distal vessel filled from right to left collaterals and medical therapy recommended.  Carotid Dopplers January 2024 showed 1 to 39% bilateral stenosis.  Patient converted to sinus rhythm spontaneously.  Follow-up echocardiogram March 2024 showed improvement in LV function with ejection fraction 55 to 60%, mild right atrial enlargement, mild mitral regurgitation and mildly dilated ascending aorta at 39 mm.  Also with history of Parkinson's.  Last seen he denies dyspnea, chest pain, palpitations or syncope.  He has had occasional dizziness in the morning predominantly when changing positions.  Current Outpatient Medications  Medication Sig Dispense Refill   acetaminophen (TYLENOL) 325 MG tablet Take 650 mg by mouth as needed for moderate pain.     amiodarone (PACERONE) 200 MG tablet Take 1 tablet (200 mg total) by mouth daily. 90 tablet 3   apixaban (ELIQUIS) 5 MG TABS tablet Take 1 tablet (5 mg total) by mouth 2 (two) times daily. 180 tablet 3   Ascorbic Acid (VITAMIN C) 1000 MG tablet Take 1,000 mg by mouth daily.     carbidopa-levodopa (SINEMET CR) 50-200 MG tablet TAKE 1 TABLET BY MOUTH AT  BEDTIME 90 tablet 1   carbidopa-levodopa (SINEMET IR) 25-100 MG tablet Take 2 tablets by mouth 4 (four) times daily. 720 tablet 0   dapagliflozin propanediol (FARXIGA) 10 MG TABS tablet Take 1 tablet (10 mg total) by mouth daily. 90 tablet 3   Multiple Vitamin (MULTIVITAMIN ADULT PO) Take 2 tablets by mouth daily.     Psyllium (METAMUCIL PO) Take 10-15 mLs by mouth daily.     spironolactone (ALDACTONE) 25 MG tablet Take 1/2 tablet (12.5 mg total) by mouth daily. 90 tablet 3    triamcinolone ointment (KENALOG) 0.1 % Apply 1 Application topically daily as needed. To ankles 450 g 1   furosemide (LASIX) 40 MG tablet Take 1 tablet (40 mg total) by mouth daily as needed for edema or fluid. 30 tablet 2   No current facility-administered medications for this visit.     Past Medical History:  Diagnosis Date   Atrial fibrillation (HCC)    12/2022->converted in hosp on amio.  Eliquis.   BPH with obstruction/lower urinary tract symptoms    orthostatic hypotension on flomax   CAD (coronary artery disease)    Cath 12/2022: chronic LAD occlusion, no stent   Chronic hoarseness    Chronic renal insufficiency, stage 3 (moderate) (HCC)    Eczema    Legs.  Triamcinolone   GERD (gastroesophageal reflux disease)    Ischemic cardiomyopathy    01/10/23: EF 25-30%, severe LV dysfxn, regional WMA   Mixed dyslipidemia    OSA (obstructive sleep apnea)    quit cpap in 2017   Osteoarthritis, multiple sites    Paget's disease of bone    Zoledronic acid 2002 and again in 2012.  Monitoring alkaline phosphatase levels.   Parkinson's disease (HCC)    Diagnosed 2018   Statin intolerance    TIA (transient ischemic attack)    12/2022 (R facial droop, dysarthria) when in hosp for a-fib/chf    Past Surgical History:  Procedure Laterality Date   carotid dopplers     12/2022. 1-39% bilat   COLONOSCOPY  2015   2015 normal--no  further screening indicated   INGUINAL HERNIA REPAIR Right 07/14/2018   Procedure: OPEN RIGHT INGUINAL HERNIA REPAIR ERAS PATHWAY;  Surgeon: Claud Kelp, MD;  Location: [facility];  Service: General;  Laterality: Right;   INSERTION OF MESH Right 07/14/2018   Procedure: INSERTION OF MESH;  Surgeon: Claud Kelp, MD;  Location: [facility];  Service: General;  Laterality: Right;   KNEE SURGERY Right 2007   arthroscopic   RIGHT/LEFT HEART CATH AND CORONARY ANGIOGRAPHY N/A 01/13/2023   Chronic LAD occlusion, with collateral flow->med  mgmt. Procedure: RIGHT/LEFT HEART CATH AND CORONARY ANGIOGRAPHY;  Surgeon: Kathleene Hazel, MD;  Location: [facility];  Service: Cardiovascular;  Laterality: N/A;   TRANSTHORACIC ECHOCARDIOGRAM     01/10/23: EF 25-30%, severe LV dysfxn, regional WMA   TRANSTHORACIC ECHOCARDIOGRAM     03/24/23 EF 55-60%, normal WM, normal valves, mild aortic dilatation   VASECTOMY  1975    Social History   Socioeconomic History   Marital status: Married    Spouse name: Boneta Lucks   Number of children: 3   Years of education: Not on file   Highest education level: Bachelor's degree (e.g., BA, AB, BS)  Occupational History   Occupation: retired    Comment: health care admin/hospital  Tobacco Use   Smoking status: Former    Current packs/day: 0.00    Types: Cigarettes    Quit date: 12/30/1982    Years since quitting: 40.9   Smokeless tobacco: Never  Vaping Use   Vaping status: Never Used  Substance and Sexual Activity   Alcohol use: Yes    Comment: one drink (wine or beer) daily   Drug use: No   Sexual activity: Not Currently  Other Topics Concern   Not on file  Social History Narrative   Married, several children, also grandchildren   Originally from [place].     Moved to [facility] April 2023.   Educ: College   Occup: retired Presenter, broadcasting   No T/A/Ds   Social Drivers of Corporate investment banker Strain: Low Risk  (11/12/2023)   Overall Financial Resource Strain (CARDIA)    Difficulty of Paying Living Expenses: Not very hard  Food Insecurity: No Food Insecurity (11/12/2023)   Hunger Vital Sign    Worried About Running Out of Food in the Last Year: Never true    Ran Out of Food in the Last Year: Never true  Transportation Needs: No Transportation Needs (11/12/2023)   PRAPARE - Administrator, Civil Service (Medical): No    Lack of Transportation (Non-Medical): No  Physical Activity: Sufficiently Active (11/12/2023)   Exercise Vital Sign    Days of  Exercise per Week: 5 days    Minutes of Exercise per Session: 40 min  Stress: No Stress Concern Present (11/12/2023)   [employer] of Occupational Health - Occupational Stress Questionnaire    Feeling of Stress : Not at all  Social Connections: Moderately Isolated (11/12/2023)   Social Connection and Isolation Panel [NHANES]    Frequency of Communication with Friends and Family: More than three times a week    Frequency of Social Gatherings with Friends and Family: More than three times a week    Attends Religious Services: Never    Database administrator or Organizations: No    Attends Banker Meetings: Never    Marital Status: Married  Catering manager Violence: Not At Risk (11/12/2023)   Humiliation, Afraid, Rape, and Kick  questionnaire    Fear of Current or Ex-Partner: No    Emotionally Abused: No    Physically Abused: No    Sexually Abused: No    Family History  Problem Relation Age of Onset   Cancer Mother        intestinal   Early death Mother    Alcohol abuse Mother    Liver disease Father        ETOH   Alcohol abuse Father    Arthritis Father    Stroke Sister    Kidney disease Sister    Stroke Sister    Stroke Brother    Arthritis Paternal Grandmother    Early death Child     ROS: no fevers or chills, productive cough, hemoptysis, dysphasia, odynophagia, melena, hematochezia, dysuria, hematuria, rash, seizure activity, orthopnea, PND, pedal edema, claudication. Remaining systems are negative.  Physical Exam: Well-developed well-nourished in no acute distress.  Skin is warm and dry.  HEENT is normal.  Neck is supple.  Chest is clear to auscultation with normal expansion.  Cardiovascular exam is regular rate and rhythm.  Abdominal exam nontender or distended. No masses palpated. Extremities show no edema. neuro grossly intact  EKG Interpretation Date/Time:  Tuesday December 16 2023 07:44:39 EST Ventricular Rate:  55 PR  Interval:  192 QRS Duration:  88 QT Interval:  470 QTC Calculation: 449 R Axis:   42  Text Interpretation: Sinus bradycardia Confirmed by Olga Millers ([id number]) on 12/16/2023 7:48:25 AM    A/P  1 paroxysmal atrial fibrillation-patient remains in sinus rhythm.  Continue amiodarone at present dose.  Check liver functions and chest x-ray.  Recent TSH normal.  Beta-blocker previously held due to history of Parkinson's and orthostasis.  Also with bradycardia.  Continue apixaban.  Check hemoglobin and renal function.  2 cardiomyopathy-felt likely secondary to atrial fibrillation with rapid ventricular response.  LV function improved on most recent echocardiogram.  Patient did not tolerate losartan due to hypotension and beta-blocker discontinued as outlined above.  He is complaining of orthostatic symptoms.  His blood pressure has been running low at home.  Discontinue spironolactone and Farxiga and follow.  I asked him to remain hydrated and increase sodium intake as well.  3 coronary artery disease-patient is not on aspirin given need for apixaban.  Patient is intolerant to statins.  4 hyperlipidemia-patient is intolerant to statins.  He does not wish to be on any other lipid-lowering medications.  Continue diet.  5 Parkinson's-Per neurology  Olga Millers, MD

## 2023-12-03 ENCOUNTER — Ambulatory Visit: Payer: Medicare Other | Admitting: Podiatry

## 2023-12-03 ENCOUNTER — Encounter: Payer: Self-pay | Admitting: Podiatry

## 2023-12-03 DIAGNOSIS — D689 Coagulation defect, unspecified: Secondary | ICD-10-CM | POA: Diagnosis not present

## 2023-12-03 DIAGNOSIS — L84 Corns and callosities: Secondary | ICD-10-CM | POA: Diagnosis not present

## 2023-12-03 DIAGNOSIS — M7752 Other enthesopathy of left foot: Secondary | ICD-10-CM

## 2023-12-03 MED ORDER — TRIAMCINOLONE ACETONIDE 10 MG/ML IJ SUSP
10.0000 mg | Freq: Once | INTRAMUSCULAR | Status: AC
Start: 2023-12-03 — End: 2023-12-03
  Administered 2023-12-03: 10 mg via INTRA_ARTICULAR

## 2023-12-07 NOTE — Progress Notes (Signed)
Subjective:   Patient ID: Mario George, male   DOB: 82 y.o.   MRN: [id number]   HPI Patient presents with inflammation around the fifth MPJ left fluid buildup around the joint and several lesions bilateral with pain when he tries to walk with patient on blood thinner   ROS      Objective:  Physical Exam  Neuro vascular status intact with inflammation fluid around the fifth MPJ left and several lesions bilateral with lucent cores painful when pressed     Assessment:  Inflammatory capsulitis fifth MPJ left with porokeratotic lesion painful high risk for him to take care of himself     Plan:  H&P reviewed and went ahead today and did careful sterile prep injected the fifth MPJ plantar capsule 2 mg dexamethasone Kenalog 5 mg Xylocaine and debrided lesions subthird fifth metatarsals bilateral no iatrogenic bleeding reappoint routine care

## 2023-12-12 ENCOUNTER — Telehealth: Payer: Self-pay | Admitting: *Deleted

## 2023-12-12 NOTE — Telephone Encounter (Signed)
S/w pt per Deliah Goody, pt will arrive at appointment next Tuesday with Dr. Jens Som at 7:30. Will not move pt per Stanton Kidney due to Dr. Jens Som being Reader D.

## 2023-12-15 ENCOUNTER — Ambulatory Visit: Payer: Medicare Other | Admitting: Internal Medicine

## 2023-12-16 ENCOUNTER — Ambulatory Visit
Admission: RE | Admit: 2023-12-16 | Discharge: 2023-12-16 | Disposition: A | Discharge status: Home / Self Care | Payer: Medicare Other | Source: Ambulatory Visit | Attending: Cardiology | Admitting: Cardiology

## 2023-12-16 ENCOUNTER — Other Ambulatory Visit: Payer: Self-pay

## 2023-12-16 ENCOUNTER — Ambulatory Visit: Payer: Medicare Other | Attending: Cardiology | Admitting: Cardiology

## 2023-12-16 ENCOUNTER — Encounter: Payer: Self-pay | Admitting: Cardiology

## 2023-12-16 VITALS — BP 112/60 | HR 55 | Ht 73.0 in | Wt 142.8 lb

## 2023-12-16 DIAGNOSIS — I480 Paroxysmal atrial fibrillation: Secondary | ICD-10-CM

## 2023-12-16 DIAGNOSIS — I2510 Atherosclerotic heart disease of native coronary artery without angina pectoris: Secondary | ICD-10-CM

## 2023-12-16 DIAGNOSIS — E785 Hyperlipidemia, unspecified: Secondary | ICD-10-CM

## 2023-12-16 DIAGNOSIS — I5020 Unspecified systolic (congestive) heart failure: Secondary | ICD-10-CM

## 2023-12-16 DIAGNOSIS — Z79899 Other long term (current) drug therapy: Secondary | ICD-10-CM

## 2023-12-16 LAB — CBC

## 2023-12-16 MED ORDER — APIXABAN 5 MG PO TABS: 5.0000 mg | ORAL_TABLET | Freq: Two times a day (BID) | ORAL | 3 refills | Status: DC

## 2023-12-16 NOTE — Patient Instructions (Signed)
Medication Instructions:   STOP FARXIGA  STOP SPIRONOLACTONE  *If you need a refill on your cardiac medications before your next appointment, please call your pharmacy*   Testing/Procedures:  A chest x-ray takes a picture of the organs and structures inside the chest, including the heart, lungs, and blood vessels. This test can show several things, including, whether the heart is enlarges; whether fluid is building up in the lungs; and whether pacemaker / defibrillator leads are still in place. [id number]   Follow-Up: At [facility], you and your health needs are our priority.  As part of our continuing mission to provide you with exceptional heart care, we have created designated Provider Care Teams.  These Care Teams include your primary Cardiologist (physician) and Advanced Practice Providers (APPs -  Physician Assistants and Nurse Practitioners) who all work together to provide you with the care you need, when you need it.    Your next appointment:   6 month(s)  Provider:   Olga Millers, MD

## 2023-12-16 NOTE — Telephone Encounter (Signed)
Prescription refill request for Eliquis received. Indication:afib Last office visit:7/24 Scr:1.26  10/24 Age: 82 Weight:64.7  kg  Prescription refilled

## 2023-12-17 LAB — CBC
Hematocrit: 43.9 % (ref 37.5–51.0)
Hemoglobin: 14.5 g/dL (ref 13.0–17.7)
MCH: 32 pg (ref 26.6–33.0)
MCHC: 33 g/dL (ref 31.5–35.7)
MCV: 97 fL (ref 79–97)
Platelets: 192 10*3/uL (ref 150–450)
RBC: 4.53 x10E6/uL (ref 4.14–5.80)
RDW: 12.3 % (ref 11.6–15.4)
WBC: 5.7 10*3/uL (ref 3.4–10.8)

## 2023-12-17 LAB — HEPATIC FUNCTION PANEL
ALT: 8 [IU]/L (ref 0–44)
AST: 16 [IU]/L (ref 0–40)
Albumin: 4.3 g/dL (ref 3.7–4.7)
Alkaline Phosphatase: 97 [IU]/L (ref 44–121)
Bilirubin Total: 0.4 mg/dL (ref 0.0–1.2)
Bilirubin, Direct: 0.16 mg/dL (ref 0.00–0.40)
Total Protein: 7.9 g/dL (ref 6.0–8.5)

## 2023-12-17 LAB — BASIC METABOLIC PANEL
BUN/Creatinine Ratio: 26 — ABNORMAL HIGH (ref 10–24)
BUN: 32 mg/dL — ABNORMAL HIGH (ref 8–27)
CO2: 27 mmol/L (ref 20–29)
Calcium: 9.4 mg/dL (ref 8.6–10.2)
Chloride: 101 mmol/L (ref 96–106)
Creatinine, Ser: 1.22 mg/dL (ref 0.76–1.27)
Glucose: 97 mg/dL (ref 70–99)
Potassium: 5.1 mmol/L (ref 3.5–5.2)
Sodium: 140 mmol/L (ref 134–144)
eGFR: 59 mL/min/{1.73_m2} — ABNORMAL LOW (ref >59)

## 2024-03-08 ENCOUNTER — Ambulatory Visit: Payer: Medicare Other | Admitting: Dermatology

## 2024-03-09 DIAGNOSIS — I5043 Acute on chronic combined systolic (congestive) and diastolic (congestive) heart failure: Secondary | ICD-10-CM | POA: Diagnosis not present

## 2024-03-09 DIAGNOSIS — D6869 Other thrombophilia: Secondary | ICD-10-CM | POA: Diagnosis not present

## 2024-03-09 DIAGNOSIS — C44320 Squamous cell carcinoma of skin of unspecified parts of face: Secondary | ICD-10-CM | POA: Diagnosis not present

## 2024-03-09 DIAGNOSIS — I872 Venous insufficiency (chronic) (peripheral): Secondary | ICD-10-CM | POA: Diagnosis not present

## 2024-03-09 DIAGNOSIS — I5020 Unspecified systolic (congestive) heart failure: Secondary | ICD-10-CM | POA: Diagnosis not present

## 2024-03-09 DIAGNOSIS — Z888 Allergy status to other drugs, medicaments and biological substances status: Secondary | ICD-10-CM | POA: Diagnosis not present

## 2024-03-09 DIAGNOSIS — I480 Paroxysmal atrial fibrillation: Secondary | ICD-10-CM | POA: Diagnosis not present

## 2024-03-09 DIAGNOSIS — M7752 Other enthesopathy of left foot: Secondary | ICD-10-CM | POA: Diagnosis not present

## 2024-03-09 DIAGNOSIS — L84 Corns and callosities: Secondary | ICD-10-CM | POA: Diagnosis not present

## 2024-03-09 DIAGNOSIS — G20A1 Parkinson's disease without dyskinesia, without mention of fluctuations: Secondary | ICD-10-CM | POA: Diagnosis not present

## 2024-03-16 DIAGNOSIS — M21621 Bunionette of right foot: Secondary | ICD-10-CM | POA: Diagnosis not present

## 2024-03-16 DIAGNOSIS — M79672 Pain in left foot: Secondary | ICD-10-CM | POA: Diagnosis not present

## 2024-03-16 DIAGNOSIS — L859 Epidermal thickening, unspecified: Secondary | ICD-10-CM | POA: Diagnosis not present

## 2024-03-16 DIAGNOSIS — L89892 Pressure ulcer of other site, stage 2: Secondary | ICD-10-CM | POA: Diagnosis not present

## 2024-03-16 DIAGNOSIS — M216X2 Other acquired deformities of left foot: Secondary | ICD-10-CM | POA: Diagnosis not present

## 2024-03-16 DIAGNOSIS — T148XXA Other injury of unspecified body region, initial encounter: Secondary | ICD-10-CM | POA: Diagnosis not present

## 2024-03-16 DIAGNOSIS — M21622 Bunionette of left foot: Secondary | ICD-10-CM | POA: Diagnosis not present

## 2024-03-16 DIAGNOSIS — L03116 Cellulitis of left lower limb: Secondary | ICD-10-CM | POA: Diagnosis not present

## 2024-03-17 DIAGNOSIS — G20A1 Parkinson's disease without dyskinesia, without mention of fluctuations: Secondary | ICD-10-CM | POA: Diagnosis not present

## 2024-03-18 ENCOUNTER — Telehealth: Payer: Self-pay | Admitting: Cardiology

## 2024-03-18 ENCOUNTER — Encounter: Payer: Self-pay | Admitting: Cardiology

## 2024-03-18 NOTE — Telephone Encounter (Signed)
 Pt c/o medication issue:  1. Name of Medication: Donepezil hcl 5mg   2. How are you currently taking this medication (dosage and times per day)?   3. Are you having a reaction (difficulty breathing--STAT)? no  4. What is your medication issue? Pt was prescribed medication by neruo and would like to know if this is okay for him to take. Please advise

## 2024-03-18 NOTE — Telephone Encounter (Signed)
 Noted  Please see documentation in 3/20 mychart message

## 2024-03-30 DIAGNOSIS — M21622 Bunionette of left foot: Secondary | ICD-10-CM | POA: Diagnosis not present

## 2024-03-30 DIAGNOSIS — L89891 Pressure ulcer of other site, stage 1: Secondary | ICD-10-CM | POA: Diagnosis not present

## 2024-03-30 DIAGNOSIS — M21621 Bunionette of right foot: Secondary | ICD-10-CM | POA: Diagnosis not present

## 2024-04-05 ENCOUNTER — Ambulatory Visit: Attending: Cardiovascular Disease | Admitting: *Deleted

## 2024-04-05 DIAGNOSIS — I480 Paroxysmal atrial fibrillation: Secondary | ICD-10-CM | POA: Diagnosis not present

## 2024-04-05 MED ORDER — DONEPEZIL HCL 5 MG PO TABS: 5.0000 mg | ORAL_TABLET | Freq: Every day | ORAL | Status: AC

## 2024-04-05 NOTE — Progress Notes (Signed)
   Nurse Visit   Date of Encounter: 04/05/2024 ID: Mario George, DOB 1941/09/09, MRN [id number]  PCP:  Jeoffrey Massed, MD   [facility] Providers Cardiologist:  Olga Millers, MD Cardiology APP:  Marcelino Duster, [place]      Visit Details   VS:  There were no vitals taken for this visit. , BMI There is no height or weight on file to calculate BMI.  Wt Readings from Last 3 Encounters:  12/16/23 142 lb 12.8 oz (64.8 kg)  10/20/23 142 lb 9.6 oz (64.7 kg)  07/29/23 146 lb (66.2 kg)     Reason for visit: ECG Performed today: Vitals, EKG, Provider consulted:Dr Croitoru, and Education Changes (medications, testing, etc.) : none Length of Visit: 5 minutes      Medications Adjustments/Labs and Tests Ordered: Orders Placed This Encounter  Procedures   EKG 12-Lead   Meds ordered this encounter  Medications   donepezil (ARICEPT) 5 MG tablet    Sig: Take 1 tablet (5 mg total) by mouth at bedtime.     Signed, Deliah Goody, RN  04/05/2024 1:35 PM

## 2024-04-19 ENCOUNTER — Ambulatory Visit (INDEPENDENT_AMBULATORY_CARE_PROVIDER_SITE_OTHER): Payer: Medicare Other | Admitting: Family Medicine

## 2024-04-19 ENCOUNTER — Encounter: Payer: Self-pay | Admitting: Family Medicine

## 2024-04-19 VITALS — BP 106/69 | HR 60 | Wt 145.8 lb

## 2024-04-19 DIAGNOSIS — N1831 Chronic kidney disease, stage 3a: Secondary | ICD-10-CM | POA: Diagnosis not present

## 2024-04-19 DIAGNOSIS — N2889 Other specified disorders of kidney and ureter: Secondary | ICD-10-CM

## 2024-04-19 DIAGNOSIS — Z79899 Other long term (current) drug therapy: Secondary | ICD-10-CM | POA: Diagnosis not present

## 2024-04-19 DIAGNOSIS — I480 Paroxysmal atrial fibrillation: Secondary | ICD-10-CM

## 2024-04-19 NOTE — Progress Notes (Signed)
 OFFICE VISIT  04/19/2024  CC:  Chief Complaint  Patient presents with   Medical Management of Chronic Issues    Patient is a 83 y.o. male who presents for 30-month follow-up PAF, CRI, and ischemic cardiomyopathy. A/P as of last visit: "1) PAF, cont apixaban  5 bid and amio 200 every day. BMET and TSH today.   2) CRI III. Avoids NSAIDs. Lytes/cr today.   3) Ischemic CM: doing well on aldactone  12.5mg  every day, farxiga  10mg  every day, and lasix  40mg  bid prn. Most recent echo 02/2023 EF 60-65%. Lytes/cr today. CAD-->chronically occluded LAD.   #4 mixed dyslipidemia, goal LDL less than 70.  He has history of statin intolerance ->myalgia/myopathy. He has established with advanced lipid clinic/Dr. Hilty and PCSK9 inhibitor was recommended but Mario George decided against medication trial."  INTERIM HX: Mario George has had a bit of progression of instability since I last saw him.  No falls, though. His short-term memory and cognitive sharpness has gradually decreased so his neurologist started donepezil  5 mg a day and he is happy to report this has helped very well.  At most recent cardiology follow-up on 12/16/2023 he was having some orthostatic symptoms, soft blood pressures.  His spironolactone  and Farxiga  were discontinued.  No palpitations, racing heart, chest pain, shortness of breath, dizziness, cough, or lower extremity swelling.  Past Medical History:  Diagnosis Date   Atrial fibrillation (HCC)    12/2022->converted in hosp on amio.  Eliquis .   BPH with obstruction/lower urinary tract symptoms    orthostatic hypotension on flomax   CAD (coronary artery disease)    Cath 12/2022: chronic LAD occlusion, no stent   Chronic hoarseness    Chronic renal insufficiency, stage 3 (moderate) (HCC)    Eczema    Legs.  Triamcinolone    GERD (gastroesophageal reflux disease)    Ischemic cardiomyopathy    01/10/23: EF 25-30%, severe LV dysfxn, regional WMA   Mixed dyslipidemia    OSA (obstructive sleep  apnea)    quit cpap in 2017   Osteoarthritis, multiple sites    Paget's disease of bone    Zoledronic acid 2002 and again in 2012.  Monitoring alkaline phosphatase levels.   Parkinson's disease (HCC)    Diagnosed 2018   Statin intolerance    TIA (transient ischemic attack)    12/2022 (R facial droop, dysarthria) when in hosp for a-fib/chf    Past Surgical History:  Procedure Laterality Date   carotid dopplers     12/2022. 1-39% bilat   COLONOSCOPY  2015   2015 normal--no further screening indicated   INGUINAL HERNIA REPAIR Right 07/14/2018   Procedure: OPEN RIGHT INGUINAL HERNIA REPAIR ERAS PATHWAY;  Surgeon: Boyce Byes, MD;  Location: [facility];  Service: General;  Laterality: Right;   INSERTION OF MESH Right 07/14/2018   Procedure: INSERTION OF MESH;  Surgeon: Boyce Byes, MD;  Location: [facility];  Service: General;  Laterality: Right;   KNEE SURGERY Right 2007   arthroscopic   RIGHT/LEFT HEART CATH AND CORONARY ANGIOGRAPHY N/A 01/13/2023   Chronic LAD occlusion, with collateral flow->med mgmt. Procedure: RIGHT/LEFT HEART CATH AND CORONARY ANGIOGRAPHY;  Surgeon: Odie Benne, MD;  Location: [facility];  Service: Cardiovascular;  Laterality: N/A;   TRANSTHORACIC ECHOCARDIOGRAM     01/10/23: EF 25-30%, severe LV dysfxn, regional WMA   TRANSTHORACIC ECHOCARDIOGRAM     03/24/23 EF 55-60%, normal WM, normal valves, mild aortic dilatation   VASECTOMY  1975    Outpatient Medications  Prior to Visit  Medication Sig Dispense Refill   acetaminophen  (TYLENOL ) 325 MG tablet Take 650 mg by mouth as needed for moderate pain.     amiodarone  (PACERONE ) 200 MG tablet Take 1 tablet (200 mg total) by mouth daily. 90 tablet 3   apixaban  (ELIQUIS ) 5 MG TABS tablet Take 1 tablet (5 mg total) by mouth 2 (two) times daily. 180 tablet 3   Ascorbic Acid (VITAMIN C) 1000 MG tablet Take 1,000 mg by mouth daily.     carbidopa -levodopa  (SINEMET  CR)  50-200 MG tablet TAKE 1 TABLET BY MOUTH AT  BEDTIME 90 tablet 1   carbidopa -levodopa  (SINEMET  IR) 25-100 MG tablet Take 2 tablets by mouth 4 (four) times daily. 720 tablet 0   donepezil  (ARICEPT ) 5 MG tablet Take 1 tablet (5 mg total) by mouth at bedtime.     Multiple Vitamin (MULTIVITAMIN ADULT PO) Take 2 tablets by mouth daily.     Psyllium (METAMUCIL PO) Take 10-15 mLs by mouth daily.     triamcinolone  ointment (KENALOG ) 0.1 % Apply 1 Application topically daily as needed. To ankles 450 g 1   furosemide  (LASIX ) 40 MG tablet Take 1 tablet (40 mg total) by mouth daily as needed for edema or fluid. 30 tablet 2   No facility-administered medications prior to visit.    Allergies  Allergen Reactions   Statins Other (See Comments)    Weakness/myalgia    Vicks Nyquil Cough [Doxylamine-Dm]     Felt like skin was crawling   Other Rash    Had rash on arm after pneumonia and flu vaccine together - never had either separately prior to this reaction.  Zquil - "felt like his skin was crawling"     Review of Systems As per HPI  PE:    04/19/2024    1:15 PM 12/16/2023    7:48 AM 10/20/2023    1:06 PM  Vitals with BMI  Height  6\' 1"    Weight 145 lbs 13 oz 142 lbs 13 oz 142 lbs 10 oz  BMI  18.84   Systolic 106 112 94  Diastolic 69 60 64  Pulse 60 55 67     Physical Exam  Gen: Alert, well appearing.  Patient is oriented to person, place, time, and situation. AFFECT: pleasant, lucid thought and speech. No further exam today  LABS:  Last CBC Lab Results  Component Value Date   WBC 5.7 12/16/2023   HGB 14.5 12/16/2023   HCT 43.9 12/16/2023   MCV 97 12/16/2023   MCH 32.0 12/16/2023   RDW 12.3 12/16/2023   PLT 192 12/16/2023   Last metabolic panel Lab Results  Component Value Date   GLUCOSE 97 12/16/2023   NA 140 12/16/2023   K 5.1 12/16/2023   CL 101 12/16/2023   CO2 27 12/16/2023   BUN 32 (H) 12/16/2023   CREATININE 1.22 12/16/2023   EGFR 59 (L) 12/16/2023   CALCIUM  9.4 12/16/2023   PHOS 3.8 01/13/2023   PROT 7.9 12/16/2023   ALBUMIN 4.3 12/16/2023   BILITOT 0.4 12/16/2023   ALKPHOS 97 12/16/2023   AST 16 12/16/2023   ALT 8 12/16/2023   ANIONGAP 13 02/03/2023   Last lipids Lab Results  Component Value Date   CHOL 207 (H) 03/27/2023   HDL 64.60 03/27/2023   LDLCALC 124 (H) 03/27/2023   TRIG 91.0 03/27/2023   CHOLHDL 3 03/27/2023   Last hemoglobin A1c Lab Results  Component Value Date   HGBA1C 5.6 01/13/2023  Last thyroid  functions Lab Results  Component Value Date   TSH 3.66 10/20/2023   IMPRESSION AND PLAN:  1) PAF, asymptomatic. Cont apixaban  5 bid and amio 200 every day. BMET and TSH today.   2) CRI III. Avoids NSAIDs. Lytes/cr today.   #3 mixed dyslipidemia, goal LDL less than 70.  He has history of statin intolerance ->myalgia/myopathy. He has established with advanced lipid clinic/Dr. Hilty and PCSK9 inhibitor was recommended but Mario George decided against medication trial.  #4 cardiomyopathy.  Reviewed most recent cardiology note from 12/16/2023. Cardiomyopathy was felt likely to be secondary to A-fib with RVR. EF in March 2024 had improved to 55-60%.  Did not tolerate losartan  due to hypotension and beta-blocker was held due to history of Parkinson's and orthostasis. Cardiology discontinued his spironolactone  and Farxiga  a few months ago.  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 Monitored by neurology. Recent improvement with donepezil  5 mg a day.  Ongoing treatment with Sinemet .  An After Visit Summary was printed and given to the patient.  FOLLOW UP: Return in about 6 months (around 10/19/2024) for routine chronic illness f/u.  Signed:  Arletha Lady, MD           04/19/2024

## 2024-04-20 LAB — BASIC METABOLIC PANEL WITH GFR
BUN: 36 mg/dL — ABNORMAL HIGH (ref 6–23)
CO2: 30 meq/L (ref 19–32)
Calcium: 9.3 mg/dL (ref 8.4–10.5)
Chloride: 102 meq/L (ref 96–112)
Creatinine, Ser: 1.09 mg/dL (ref 0.40–1.50)
GFR: 62.86 mL/min (ref >60.00)
Glucose, Bld: 109 mg/dL — ABNORMAL HIGH (ref 70–99)
Potassium: 4.9 meq/L (ref 3.5–5.1)
Sodium: 139 meq/L (ref 135–145)

## 2024-04-20 LAB — TSH: TSH: 3.33 u[IU]/mL (ref 0.35–5.50)

## 2024-05-03 ENCOUNTER — Ambulatory Visit: Payer: Self-pay

## 2024-05-03 DIAGNOSIS — G20A1 Parkinson's disease without dyskinesia, without mention of fluctuations: Secondary | ICD-10-CM | POA: Diagnosis not present

## 2024-05-03 DIAGNOSIS — L989 Disorder of the skin and subcutaneous tissue, unspecified: Secondary | ICD-10-CM | POA: Diagnosis not present

## 2024-05-03 DIAGNOSIS — R1310 Dysphagia, unspecified: Secondary | ICD-10-CM | POA: Diagnosis not present

## 2024-05-03 DIAGNOSIS — R1314 Dysphagia, pharyngoesophageal phase: Secondary | ICD-10-CM | POA: Diagnosis not present

## 2024-05-03 DIAGNOSIS — R490 Dysphonia: Secondary | ICD-10-CM | POA: Diagnosis not present

## 2024-05-03 DIAGNOSIS — R1312 Dysphagia, oropharyngeal phase: Secondary | ICD-10-CM | POA: Diagnosis not present

## 2024-05-03 NOTE — Telephone Encounter (Signed)
  Chief Complaint: hypertension Symptoms: elevated BP Frequency: x today Pertinent Negatives: Patient denies chest pain, difficulty breathing, blurred vision, loss of vision, facial droop, unilateral numbness or weakness, headaches Disposition: [] ED /[] Urgent Care (no appt availability in office) / [x] Appointment(In office/virtual)/ []  [facility]/ [] Home Care/ [] Refused Recommended Disposition /[]  Mobile Bus/ []  Follow-up with PCP Additional Notes: Patient states he was at his ENT office about an hour ago and his BP was elevated. He denies any history of HTN and states it is usually running low. Patient agreeable to acute visit tomorrow with PCP. Advised he call back if BP readings at home are 180/110 or higher and if he has any new symptoms.  Copied from CRM [id number]. Topic: Clinical - Red Word Triage >> May 03, 2024  1:53 PM Allyne Areola wrote: Red Word that prompted transfer to Nurse Triage: Patient is currently at an ENT office and they informed him that his blood pressure is high, readings are 191/80 and lowest reading was 176/78. He is not feeling any other symptoms. Reason for Disposition  Systolic BP  >= 160 OR Diastolic >= 100  Answer Assessment - Initial Assessment Questions 1. BLOOD PRESSURE: "What is the blood pressure?" "Did you take at least two measurements 5 minutes apart?"     191/80 and 176/78  2. ONSET: "When did you take your blood pressure?"     Today within the last hour.  3. HOW: "How did you take your blood pressure?" (e.g., automatic home BP monitor, visiting nurse)     Automatic cuff at ENT office.  4. HISTORY: "Do you have a history of high blood pressure?"     Denies. Patient states he usually runs low.  5. MEDICINES: "Are you taking any medicines for blood pressure?" "Have you missed any doses recently?"     No.  6. OTHER SYMPTOMS: "Do you have any symptoms?" (e.g., blurred vision, chest pain, difficulty breathing, headache, weakness)      Denies.  7. PREGNANCY: "Is there any chance you are pregnant?" "When was your last menstrual period?"     N/A.  Protocols used: Blood Pressure - High-A-AH

## 2024-05-03 NOTE — Telephone Encounter (Signed)
 Sent as FYI, please see below

## 2024-05-04 ENCOUNTER — Ambulatory Visit (INDEPENDENT_AMBULATORY_CARE_PROVIDER_SITE_OTHER): Admitting: Family Medicine

## 2024-05-04 ENCOUNTER — Encounter: Payer: Self-pay | Admitting: Family Medicine

## 2024-05-04 VITALS — BP 110/64 | HR 58 | Temp 97.7°F | Ht 73.0 in | Wt 142.2 lb

## 2024-05-04 DIAGNOSIS — I951 Orthostatic hypotension: Secondary | ICD-10-CM | POA: Diagnosis not present

## 2024-05-04 DIAGNOSIS — R030 Elevated blood-pressure reading, without diagnosis of hypertension: Secondary | ICD-10-CM

## 2024-05-04 NOTE — Progress Notes (Signed)
 OFFICE VISIT  05/04/2024  CC: Recent elevated blood pressure  Patient is a 83 y.o. male who presents accompanied by his wife for elevated blood pressure.  HPI:  Blood pressure 176-190 at his ENT visit yesterday.  Diastolic in the 70s.  Heart rate was 55. He has taken his blood pressure at home several times since then and it has been in the 120s over 60s to 70s for the most part.  Last night it did increase to the 160s to 180s over 90s. Review of blood pressures in the EMR over the last 4 years show consistent normal measurements and even several low blood pressure measurements.  He has a history of orthostatic dizziness/hypotension.  No dizziness, palpitations, chest pain, headaches, or visual abnormalities. He does have chronic mild edema in his lower extremities and does take Lasix  on and off.  Past Medical History:  Diagnosis Date   Atrial fibrillation (HCC)    12/2022->converted in hosp on amio.  Eliquis .   BPH with obstruction/lower urinary tract symptoms    orthostatic hypotension on flomax   CAD (coronary artery disease)    Cath 12/2022: chronic LAD occlusion, no stent   Chronic hoarseness    Chronic renal insufficiency, stage 3 (moderate) (HCC)    Eczema    Legs.  Triamcinolone    GERD (gastroesophageal reflux disease)    Ischemic cardiomyopathy    01/10/23: EF 25-30%, severe LV dysfxn, regional WMA   Mixed dyslipidemia    OSA (obstructive sleep apnea)    quit cpap in 2017   Osteoarthritis, multiple sites    Paget's disease of bone    Zoledronic acid 2002 and again in 2012.  Monitoring alkaline phosphatase levels.   Parkinson's disease (HCC)    Diagnosed 2018   Statin intolerance    TIA (transient ischemic attack)    12/2022 (R facial droop, dysarthria) when in hosp for a-fib/chf    Past Surgical History:  Procedure Laterality Date   carotid dopplers     12/2022. 1-39% bilat   COLONOSCOPY  2015   2015 normal--no further screening indicated   INGUINAL HERNIA REPAIR  Right 07/14/2018   Procedure: OPEN RIGHT INGUINAL HERNIA REPAIR ERAS PATHWAY;  Surgeon: Boyce Byes, MD;  Location: [facility];  Service: General;  Laterality: Right;   INSERTION OF MESH Right 07/14/2018   Procedure: INSERTION OF MESH;  Surgeon: Boyce Byes, MD;  Location: [facility];  Service: General;  Laterality: Right;   KNEE SURGERY Right 2007   arthroscopic   RIGHT/LEFT HEART CATH AND CORONARY ANGIOGRAPHY N/A 01/13/2023   Chronic LAD occlusion, with collateral flow->med mgmt. Procedure: RIGHT/LEFT HEART CATH AND CORONARY ANGIOGRAPHY;  Surgeon: Odie Benne, MD;  Location: [facility];  Service: Cardiovascular;  Laterality: N/A;   TRANSTHORACIC ECHOCARDIOGRAM     01/10/23: EF 25-30%, severe LV dysfxn, regional WMA   TRANSTHORACIC ECHOCARDIOGRAM     03/24/23 EF 55-60%, normal WM, normal valves, mild aortic dilatation   VASECTOMY  1975    Outpatient Medications Prior to Visit  Medication Sig Dispense Refill   acetaminophen  (TYLENOL ) 325 MG tablet Take 650 mg by mouth as needed for moderate pain.     amiodarone  (PACERONE ) 200 MG tablet Take 1 tablet (200 mg total) by mouth daily. 90 tablet 3   apixaban  (ELIQUIS ) 5 MG TABS tablet Take 1 tablet (5 mg total) by mouth 2 (two) times daily. 180 tablet 3   Ascorbic Acid (VITAMIN C) 1000 MG tablet Take 1,000 mg  by mouth daily.     carbidopa -levodopa  (SINEMET  CR) 50-200 MG tablet TAKE 1 TABLET BY MOUTH AT  BEDTIME 90 tablet 1   carbidopa -levodopa  (SINEMET  IR) 25-100 MG tablet Take 2 tablets by mouth 4 (four) times daily. 720 tablet 0   donepezil  (ARICEPT ) 5 MG tablet Take 1 tablet (5 mg total) by mouth at bedtime.     furosemide  (LASIX ) 40 MG tablet Take 1 tablet (40 mg total) by mouth daily as needed for edema or fluid. 30 tablet 2   Multiple Vitamin (MULTIVITAMIN ADULT PO) Take 2 tablets by mouth daily.     Psyllium (METAMUCIL PO) Take 10-15 mLs by mouth daily.     triamcinolone  ointment  (KENALOG ) 0.1 % Apply 1 Application topically daily as needed. To ankles 450 g 1   No facility-administered medications prior to visit.    Allergies  Allergen Reactions   Statins Other (See Comments)    Weakness/myalgia    Vicks Nyquil Cough [Doxylamine-Dm]     Felt like skin was crawling   Other Rash    Had rash on arm after pneumonia and flu vaccine together - never had either separately prior to this reaction.  Zquil - "felt like his skin was crawling"     Review of Systems  As per HPI  PE:    05/04/2024   10:44 AM 05/04/2024   10:35 AM 04/19/2024    1:15 PM  Vitals with BMI  Height  6\' 1"    Weight  142 lbs 3 oz 145 lbs 13 oz  BMI  18.76   Systolic 110 150 474  Diastolic 64 68 69  Pulse  58 60     Physical Exam  Gen: Alert, well appearing.  Patient is oriented to person, place, time, and situation. AFFECT: pleasant, lucid thought and speech. CV: RRR, no m/r/g.   LUNGS: CTA bilat, nonlabored resps, good aeration in all lung fields. EXT: 1+ pitting edema in the left ankle, trace pitting edema in the right ankle.  LABS:  Last metabolic panel Lab Results  Component Value Date   GLUCOSE 109 (H) 04/19/2024   NA 139 04/19/2024   K 4.9 04/19/2024   CL 102 04/19/2024   CO2 30 04/19/2024   BUN 36 (H) 04/19/2024   CREATININE 1.09 04/19/2024   GFR 62.86 04/19/2024   CALCIUM 9.3 04/19/2024   PHOS 3.8 01/13/2023   PROT 7.9 12/16/2023   ALBUMIN 4.3 12/16/2023   BILITOT 0.4 12/16/2023   ALKPHOS 97 12/16/2023   AST 16 12/16/2023   ALT 8 12/16/2023   ANIONGAP 13 02/03/2023   Lab Results  Component Value Date   TSH 3.33 04/19/2024   IMPRESSION AND PLAN:  #1 elevated blood pressures, unknown etiology. He has a significant history of borderline low blood pressures and orthostatic dizziness. We will see what his blood pressure pattern is over the next week.  He will check his blood pressure twice a day--> check BP and then repeat 5 minutes later and take the average  of the 2 readings.  An After Visit Summary was printed and given to the patient.  FOLLOW UP: Return in about 1 week (around 05/11/2024) for f/u BPs.  Signed:  Arletha Lady, MD           05/04/2024

## 2024-05-05 ENCOUNTER — Encounter (HOSPITAL_COMMUNITY): Payer: Self-pay

## 2024-05-12 ENCOUNTER — Encounter: Payer: Self-pay | Admitting: Family Medicine

## 2024-05-12 ENCOUNTER — Ambulatory Visit (INDEPENDENT_AMBULATORY_CARE_PROVIDER_SITE_OTHER): Admitting: Family Medicine

## 2024-05-12 VITALS — BP 114/68 | HR 54 | Temp 97.6°F | Ht 73.0 in | Wt 145.8 lb

## 2024-05-12 DIAGNOSIS — I959 Hypotension, unspecified: Secondary | ICD-10-CM | POA: Diagnosis not present

## 2024-05-12 DIAGNOSIS — R030 Elevated blood-pressure reading, without diagnosis of hypertension: Secondary | ICD-10-CM | POA: Diagnosis not present

## 2024-05-12 NOTE — Progress Notes (Signed)
 OFFICE VISIT  05/12/2024  CC:  Chief Complaint  Patient presents with   Hypotension    1 week f/u    Patient is a 83 y.o. male who presents accompanied by his wife for 1 week follow-up elevated blood pressure. A/P as of last visit: "1 elevated blood pressures, unknown etiology. He has a significant history of borderline low blood pressures and orthostatic dizziness. We will see what his blood pressure pattern is over the next week.  He will check his blood pressure twice a day--> check BP and then repeat 5 minutes later and take the average of the 2 readings."  INTERIM HX: Blood pressures have been in the low to low- normal range for the most part.  He has had a couple readings up into the 140s systolic, highest diastolic was in the low 80s.  His heart rate is in the 50s to low 60s. Denies dizziness. No change in mild lower extremity swelling lately.  Past Medical History:  Diagnosis Date   Atrial fibrillation (HCC)    12/2022->converted in hosp on amio.  Eliquis .   BPH with obstruction/lower urinary tract symptoms    orthostatic hypotension on flomax   CAD (coronary artery disease)    Cath 12/2022: chronic LAD occlusion, no stent   Chronic hoarseness    Chronic renal insufficiency, stage 3 (moderate) (HCC)    Eczema    Legs.  Triamcinolone    GERD (gastroesophageal reflux disease)    Ischemic cardiomyopathy    01/10/23: EF 25-30%, severe LV dysfxn, regional WMA   Mixed dyslipidemia    OSA (obstructive sleep apnea)    quit cpap in 2017   Osteoarthritis, multiple sites    Paget's disease of bone    Zoledronic acid 2002 and again in 2012.  Monitoring alkaline phosphatase levels.   Parkinson's disease (HCC)    Diagnosed 2018   Statin intolerance    TIA (transient ischemic attack)    12/2022 (R facial droop, dysarthria) when in hosp for a-fib/chf    Past Surgical History:  Procedure Laterality Date   carotid dopplers     12/2022. 1-39% bilat   COLONOSCOPY  2015   2015  normal--no further screening indicated   INGUINAL HERNIA REPAIR Right 07/14/2018   Procedure: OPEN RIGHT INGUINAL HERNIA REPAIR ERAS PATHWAY;  Surgeon: Boyce Byes, MD;  Location: [facility];  Service: General;  Laterality: Right;   INSERTION OF MESH Right 07/14/2018   Procedure: INSERTION OF MESH;  Surgeon: Boyce Byes, MD;  Location: [facility];  Service: General;  Laterality: Right;   KNEE SURGERY Right 2007   arthroscopic   RIGHT/LEFT HEART CATH AND CORONARY ANGIOGRAPHY N/A 01/13/2023   Chronic LAD occlusion, with collateral flow->med mgmt. Procedure: RIGHT/LEFT HEART CATH AND CORONARY ANGIOGRAPHY;  Surgeon: Odie Benne, MD;  Location: [facility];  Service: Cardiovascular;  Laterality: N/A;   TRANSTHORACIC ECHOCARDIOGRAM     01/10/23: EF 25-30%, severe LV dysfxn, regional WMA   TRANSTHORACIC ECHOCARDIOGRAM     03/24/23 EF 55-60%, normal WM, normal valves, mild aortic dilatation   VASECTOMY  1975    Outpatient Medications Prior to Visit  Medication Sig Dispense Refill   acetaminophen  (TYLENOL ) 325 MG tablet Take 650 mg by mouth as needed for moderate pain.     amiodarone  (PACERONE ) 200 MG tablet Take 1 tablet (200 mg total) by mouth daily. 90 tablet 3   apixaban  (ELIQUIS ) 5 MG TABS tablet Take 1 tablet (5 mg total) by mouth  2 (two) times daily. 180 tablet 3   Ascorbic Acid (VITAMIN C) 1000 MG tablet Take 1,000 mg by mouth daily.     carbidopa -levodopa  (SINEMET  CR) 50-200 MG tablet TAKE 1 TABLET BY MOUTH AT  BEDTIME 90 tablet 1   carbidopa -levodopa  (SINEMET  IR) 25-100 MG tablet Take 2 tablets by mouth 4 (four) times daily. 720 tablet 0   Cetirizine HCl (ZYRTEC PO) Take by mouth daily.     donepezil  (ARICEPT ) 5 MG tablet Take 1 tablet (5 mg total) by mouth at bedtime.     Multiple Vitamin (MULTIVITAMIN ADULT PO) Take 2 tablets by mouth daily.     Psyllium (METAMUCIL PO) Take 10-15 mLs by mouth daily.     triamcinolone  ointment  (KENALOG ) 0.1 % Apply 1 Application topically daily as needed. To ankles 450 g 1   furosemide  (LASIX ) 40 MG tablet Take 1 tablet (40 mg total) by mouth daily as needed for edema or fluid. 30 tablet 2   No facility-administered medications prior to visit.    Allergies  Allergen Reactions   Statins Other (See Comments)    Weakness/myalgia    Vicks Nyquil Cough [Doxylamine-Dm]     Felt like skin was crawling   Other Rash    Had rash on arm after pneumonia and flu vaccine together - never had either separately prior to this reaction.  Zquil - "felt like his skin was crawling"     Review of Systems As per HPI  PE:    05/12/2024    3:39 PM 05/04/2024   10:44 AM 05/04/2024   10:35 AM  Vitals with BMI  Height 6\' 1"   6\' 1"   Weight 145 lbs 13 oz  142 lbs 3 oz  BMI 19.24  18.76  Systolic 114 110 518  Diastolic 68 64 68  Pulse 54  58     Physical Exam  Gen: Alert, well appearing.  Patient is oriented to person, place, time, and situation. AFFECT: pleasant, lucid thought and speech. Extremities: 2+ pitting edema in the right ankle and 1+ pitting edema in the left ankle. No further exam today  LABS:  Last CBC Lab Results  Component Value Date   WBC 5.7 12/16/2023   HGB 14.5 12/16/2023   HCT 43.9 12/16/2023   MCV 97 12/16/2023   MCH 32.0 12/16/2023   RDW 12.3 12/16/2023   PLT 192 12/16/2023   Last metabolic panel Lab Results  Component Value Date   GLUCOSE 109 (H) 04/19/2024   NA 139 04/19/2024   K 4.9 04/19/2024   CL 102 04/19/2024   CO2 30 04/19/2024   BUN 36 (H) 04/19/2024   CREATININE 1.09 04/19/2024   GFR 62.86 04/19/2024   CALCIUM 9.3 04/19/2024   PHOS 3.8 01/13/2023   PROT 7.9 12/16/2023   ALBUMIN 4.3 12/16/2023   BILITOT 0.4 12/16/2023   ALKPHOS 97 12/16/2023   AST 16 12/16/2023   ALT 8 12/16/2023   ANIONGAP 13 02/03/2023   IMPRESSION AND PLAN:  Episodic hypertension. He may have some autonomic instability associated with his parkinsonism.  He tends  to have low to low-normal blood pressure the majority of the time. As long as his elevations are mild and only intermittent then we certainly do not want to start any antihypertensive. He will continue to monitor blood pressure about every 3 days.  He will call or return if any persistent elevations.  An After Visit Summary was printed and given to the patient.  FOLLOW UP: Return for As  needed.  Signed:  Arletha Lady, MD           05/12/2024

## 2024-06-03 ENCOUNTER — Encounter: Payer: Self-pay | Admitting: Cardiology

## 2024-06-23 ENCOUNTER — Telehealth: Payer: Self-pay | Admitting: Family Medicine

## 2024-06-23 NOTE — Telephone Encounter (Unsigned)
 Copied from CRM [id number]. Topic: Clinical - Medical Advice >> Jun 23, 2024  9:41 AM Ernestene SQUIBB wrote: Reason for CRM: pt is traveling via airplane next week, would like to know if his vaccine is up to date and would like to know what type of mask to wear on the plane, pt can be reached [phone]

## 2024-06-24 ENCOUNTER — Encounter: Payer: Self-pay | Admitting: Family Medicine

## 2024-08-22 ENCOUNTER — Other Ambulatory Visit: Payer: Self-pay | Admitting: Cardiology

## 2024-08-22 DIAGNOSIS — I5020 Unspecified systolic (congestive) heart failure: Secondary | ICD-10-CM

## 2024-08-22 DIAGNOSIS — I480 Paroxysmal atrial fibrillation: Secondary | ICD-10-CM

## 2024-08-22 DIAGNOSIS — I2510 Atherosclerotic heart disease of native coronary artery without angina pectoris: Secondary | ICD-10-CM

## 2024-10-05 ENCOUNTER — Other Ambulatory Visit: Payer: Self-pay

## 2024-10-05 DIAGNOSIS — I480 Paroxysmal atrial fibrillation: Secondary | ICD-10-CM

## 2024-10-05 DIAGNOSIS — I5020 Unspecified systolic (congestive) heart failure: Secondary | ICD-10-CM

## 2024-10-05 DIAGNOSIS — I2510 Atherosclerotic heart disease of native coronary artery without angina pectoris: Secondary | ICD-10-CM

## 2024-10-05 MED ORDER — APIXABAN 5 MG PO TABS: 5.0000 mg | ORAL_TABLET | Freq: Two times a day (BID) | ORAL | 3 refills | Status: AC

## 2024-10-05 NOTE — Telephone Encounter (Signed)
 Prescription refill request for Eliquis  received. Indication:afib Last office visit:12/24 Scr:1.09  4/25 Age: 82 Weight:66.1  kg  Prescription refilled

## 2024-10-20 ENCOUNTER — Ambulatory Visit: Admitting: Family Medicine

## 2024-11-27 ENCOUNTER — Other Ambulatory Visit: Payer: Self-pay | Admitting: Cardiology

## 2024-11-27 DIAGNOSIS — I480 Paroxysmal atrial fibrillation: Secondary | ICD-10-CM

## 2024-11-27 DIAGNOSIS — I5020 Unspecified systolic (congestive) heart failure: Secondary | ICD-10-CM

## 2024-11-27 DIAGNOSIS — I2510 Atherosclerotic heart disease of native coronary artery without angina pectoris: Secondary | ICD-10-CM

## 2025-01-07 ENCOUNTER — Other Ambulatory Visit: Payer: Self-pay | Admitting: Cardiology

## 2025-01-07 DIAGNOSIS — I480 Paroxysmal atrial fibrillation: Secondary | ICD-10-CM

## 2025-01-07 DIAGNOSIS — I2510 Atherosclerotic heart disease of native coronary artery without angina pectoris: Secondary | ICD-10-CM

## 2025-01-07 DIAGNOSIS — I5020 Unspecified systolic (congestive) heart failure: Secondary | ICD-10-CM
